# Patient Record
Sex: Female | Born: 1940
Health system: Southern US, Community
[De-identification: ages and names within clinical notes are randomized; demographics above are authoritative.]

## PROBLEM LIST (undated history)

## (undated) DIAGNOSIS — I10 Essential (primary) hypertension: Secondary | ICD-10-CM

## (undated) DIAGNOSIS — F0150 Vascular dementia without behavioral disturbance: Secondary | ICD-10-CM

## (undated) DIAGNOSIS — B009 Herpesviral infection, unspecified: Secondary | ICD-10-CM

## (undated) HISTORY — DX: Herpesviral infection, unspecified: B00.9

## (undated) HISTORY — PX: ABDOMINAL HYSTERECTOMY: SHX81

## (undated) HISTORY — DX: Essential (primary) hypertension: I10

## (undated) HISTORY — DX: Vascular dementia, unspecified severity, without behavioral disturbance, psychotic disturbance, mood disturbance, and anxiety: F01.50

---

## 2011-12-18 DIAGNOSIS — E785 Hyperlipidemia, unspecified: Secondary | ICD-10-CM | POA: Diagnosis not present

## 2011-12-18 DIAGNOSIS — E119 Type 2 diabetes mellitus without complications: Secondary | ICD-10-CM | POA: Diagnosis not present

## 2012-01-29 DIAGNOSIS — H4011X0 Primary open-angle glaucoma, stage unspecified: Secondary | ICD-10-CM | POA: Diagnosis not present

## 2012-01-29 DIAGNOSIS — E11319 Type 2 diabetes mellitus with unspecified diabetic retinopathy without macular edema: Secondary | ICD-10-CM | POA: Diagnosis not present

## 2012-02-13 DIAGNOSIS — H4011X0 Primary open-angle glaucoma, stage unspecified: Secondary | ICD-10-CM | POA: Diagnosis not present

## 2012-02-28 DIAGNOSIS — H3340 Traction detachment of retina, unspecified eye: Secondary | ICD-10-CM | POA: Diagnosis not present

## 2012-02-28 DIAGNOSIS — H35379 Puckering of macula, unspecified eye: Secondary | ICD-10-CM | POA: Diagnosis not present

## 2012-02-28 DIAGNOSIS — E11359 Type 2 diabetes mellitus with proliferative diabetic retinopathy without macular edema: Secondary | ICD-10-CM | POA: Diagnosis not present

## 2012-02-28 DIAGNOSIS — Z961 Presence of intraocular lens: Secondary | ICD-10-CM | POA: Diagnosis not present

## 2012-03-03 DIAGNOSIS — I739 Peripheral vascular disease, unspecified: Secondary | ICD-10-CM | POA: Diagnosis not present

## 2012-03-04 DIAGNOSIS — M949 Disorder of cartilage, unspecified: Secondary | ICD-10-CM | POA: Diagnosis not present

## 2012-03-04 DIAGNOSIS — I10 Essential (primary) hypertension: Secondary | ICD-10-CM | POA: Diagnosis not present

## 2012-03-04 DIAGNOSIS — K219 Gastro-esophageal reflux disease without esophagitis: Secondary | ICD-10-CM | POA: Diagnosis not present

## 2012-03-04 DIAGNOSIS — E119 Type 2 diabetes mellitus without complications: Secondary | ICD-10-CM | POA: Diagnosis not present

## 2012-04-07 DIAGNOSIS — Z113 Encounter for screening for infections with a predominantly sexual mode of transmission: Secondary | ICD-10-CM | POA: Diagnosis not present

## 2012-04-07 DIAGNOSIS — Z20828 Contact with and (suspected) exposure to other viral communicable diseases: Secondary | ICD-10-CM | POA: Diagnosis not present

## 2012-04-07 DIAGNOSIS — Z01419 Encounter for gynecological examination (general) (routine) without abnormal findings: Secondary | ICD-10-CM | POA: Diagnosis not present

## 2012-04-07 DIAGNOSIS — Z124 Encounter for screening for malignant neoplasm of cervix: Secondary | ICD-10-CM | POA: Diagnosis not present

## 2012-04-10 ENCOUNTER — Ambulatory Visit (INDEPENDENT_AMBULATORY_CARE_PROVIDER_SITE_OTHER): Payer: Medicare Other | Admitting: Family

## 2012-04-10 ENCOUNTER — Encounter: Payer: Self-pay | Admitting: Family

## 2012-04-10 VITALS — BP 112/80 | HR 78 | Ht 59.0 in | Wt 204.0 lb

## 2012-04-10 DIAGNOSIS — Z79899 Other long term (current) drug therapy: Secondary | ICD-10-CM

## 2012-04-10 DIAGNOSIS — E669 Obesity, unspecified: Secondary | ICD-10-CM

## 2012-04-10 DIAGNOSIS — I10 Essential (primary) hypertension: Secondary | ICD-10-CM

## 2012-04-10 DIAGNOSIS — E7800 Pure hypercholesterolemia, unspecified: Secondary | ICD-10-CM

## 2012-04-10 DIAGNOSIS — E119 Type 2 diabetes mellitus without complications: Secondary | ICD-10-CM

## 2012-04-10 LAB — BASIC METABOLIC PANEL
CO2: 30 mEq/L (ref 19–32)
Calcium: 9.4 mg/dL (ref 8.4–10.5)
Chloride: 106 mEq/L (ref 96–112)
Potassium: 3.8 mEq/L (ref 3.5–5.1)
Sodium: 140 mEq/L (ref 135–145)

## 2012-04-10 LAB — TSH: TSH: 1.28 u[IU]/mL (ref 0.35–5.50)

## 2012-04-10 NOTE — Progress Notes (Signed)
Subjective:    Patient ID: Lynn Swanson, female    DOB: 18-May-1941, 71 y.o.   MRN: [id number]  HPI 71 year old [place] American female, nonsmoker, new patient to the practice is in the be established. She has a history of hypertension, hyperlipidemia, type 2 diabetes, and obesity. She was in an assisted living facility and her daughter has relocated her here. She tries to follow a healthy diet, and exercises on the treadmill 5 minutes a day. Her blood sugars run between 100 to 200s fasting and postprandial. Patient denies any lightheadedness, dizziness, chest pain, palpitations, shortness of breath or edema.   Review of Systems  Constitutional: Negative.   HENT: Negative.   Eyes: Negative.   Respiratory: Negative.   Cardiovascular: Negative.   Gastrointestinal: Negative.   Genitourinary: Negative.  Negative for frequency.  Musculoskeletal: Negative.   Skin: Negative.   Neurological: Negative.   Hematological: Negative.   Psychiatric/Behavioral: Negative.    Past Medical History  Diagnosis Date  . Hypertension   . Glaucoma   . Diabetes mellitus     History   Social History  . Marital Status: Widowed    Spouse Name: N/A    Number of Children: N/A  . Years of Education: N/A   Occupational History  . Not on file.   Social History Main Topics  . Smoking status: Never Smoker   . Smokeless tobacco: Current User    Types: Snuff  . Alcohol Use: No  . Drug Use: No  . Sexually Active: Not on file   Other Topics Concern  . Not on file   Social History Narrative  . No narrative on file    Past Surgical History  Procedure Date  . Abdominal hysterectomy     No family history on file.  No Known Allergies  Current Outpatient Prescriptions on File Prior to Visit  Medication Sig Dispense Refill  . carvedilol (COREG) 12.5 MG tablet Take 12.5 mg by mouth 2 (two) times daily with a meal.       . JANUVIA 100 MG tablet Take 100 mg by mouth daily.       Marland Kitchen KLOR-CON M20 20  MEQ tablet Take 20 mEq by mouth 2 (two) times daily.       Marland Kitchen LANTUS 100 UNIT/ML injection Inject 62 Units into the skin daily.       Marland Kitchen omeprazole (PRILOSEC) 20 MG capsule Take 20 mg by mouth daily.       . pioglitazone (ACTOS) 15 MG tablet Take 15 mg by mouth daily.       . simvastatin (ZOCOR) 20 MG tablet Take 20 mg by mouth daily.       . valsartan-hydrochlorothiazide (DIOVAN-HCT) 160-12.5 MG per tablet Take 1 tablet by mouth daily.         BP 112/80  Pulse 78  Ht 4\' 11"  (1.499 m)  Wt 204 lb (92.534 kg)  BMI 41.20 kg/m2  SpO2 87%chart    Objective:   Physical Exam  Constitutional: She is oriented to person, place, and time. She appears well-developed and well-nourished.  HENT:  Right Ear: External ear normal.  Left Ear: External ear normal.  Nose: Nose normal.  Mouth/Throat: Oropharynx is clear and moist.  Eyes: Conjunctivae are normal. Pupils are equal, round, and reactive to light.  Neck: Normal range of motion. Neck supple.  Cardiovascular: Normal rate, regular rhythm and normal heart sounds.   Pulmonary/Chest: Effort normal and breath sounds normal.  Abdominal: Soft. Bowel sounds are normal.  Musculoskeletal: Normal range of motion.  Neurological: She is alert and oriented to person, place, and time.  Skin: Skin is warm and dry.  Psychiatric: She has a normal mood and affect.          Assessment & Plan:  Assessment: Hypertension, type 2 diabetes, obesity  Plan: Lab sent to include BMP, TSH, hemoglobin A1c will notify patient pending results. Order for future lipids was placed. Encouraged healthy diet and exercise. Continue seeing gynecology. Had mammogram results faxed here. We'll follow the patient in the results of her labs in 3 months and sooner when necessary. DC Norvasc.

## 2012-04-14 ENCOUNTER — Other Ambulatory Visit: Payer: Self-pay | Admitting: Family

## 2012-04-14 DIAGNOSIS — E1165 Type 2 diabetes mellitus with hyperglycemia: Secondary | ICD-10-CM

## 2012-04-14 MED ORDER — INSULIN GLARGINE 100 UNIT/ML ~~LOC~~ SOLN: 62.0000 [IU] | Freq: Every day | SUBCUTANEOUS | Status: DC

## 2012-04-14 MED ORDER — BRIMONIDINE TARTRATE 0.1 % OP SOLN: 1.0000 [drp] | Freq: Two times a day (BID) | OPHTHALMIC | Status: DC

## 2012-04-14 MED ORDER — SYRINGE (DISPOSABLE) 1 ML MISC: 20.0000 [IU] | Freq: Every day | Status: DC

## 2012-04-14 MED ORDER — DORZOLAMIDE HCL-TIMOLOL MAL 2-0.5 % OP SOLN: 1.0000 [drp] | Freq: Two times a day (BID) | OPHTHALMIC | Status: DC

## 2012-04-14 MED ORDER — CARVEDILOL 12.5 MG PO TABS: 12.5000 mg | ORAL_TABLET | Freq: Two times a day (BID) | ORAL | Status: DC

## 2012-04-14 MED ORDER — SIMVASTATIN 20 MG PO TABS: 20.0000 mg | ORAL_TABLET | Freq: Every day | ORAL | Status: DC

## 2012-04-14 MED ORDER — VALSARTAN-HYDROCHLOROTHIAZIDE 160-12.5 MG PO TABS: 1.0000 | ORAL_TABLET | Freq: Every day | ORAL | Status: DC

## 2012-04-14 MED ORDER — "INSULIN SYRINGE-NEEDLE U-100 30G X 1/2"" 1 ML MISC": 20.0000 [IU] | Freq: Every day | Status: DC

## 2012-04-14 MED ORDER — BIMATOPROST 0.01 % OP SOLN: 1.0000 [drp] | Freq: Every day | OPHTHALMIC | Status: DC

## 2012-04-14 MED ORDER — PIOGLITAZONE HCL 15 MG PO TABS: 15.0000 mg | ORAL_TABLET | Freq: Every day | ORAL | Status: DC

## 2012-04-14 MED ORDER — OMEPRAZOLE 20 MG PO CPDR: 20.0000 mg | DELAYED_RELEASE_CAPSULE | Freq: Every day | ORAL | Status: DC

## 2012-04-14 MED ORDER — LOTEPREDNOL ETABONATE 0.5 % OP SUSP: 1.0000 [drp] | Freq: Four times a day (QID) | OPHTHALMIC | Status: DC

## 2012-04-14 MED ORDER — POTASSIUM CHLORIDE CRYS ER 20 MEQ PO TBCR: 20.0000 meq | EXTENDED_RELEASE_TABLET | Freq: Two times a day (BID) | ORAL | Status: DC

## 2012-04-14 MED ORDER — SITAGLIPTIN PHOSPHATE 100 MG PO TABS: 100.0000 mg | ORAL_TABLET | Freq: Every day | ORAL | Status: DC

## 2012-04-15 ENCOUNTER — Telehealth: Payer: Self-pay | Admitting: Family

## 2012-04-15 NOTE — Telephone Encounter (Signed)
Patient's daughter called back to report that her mom's blood sugar is 247 every morning and would like advise.

## 2012-04-15 NOTE — Telephone Encounter (Signed)
Increase Lantus by 2 additional units. May try Miralax OTC with morning meds everyday

## 2012-04-15 NOTE — Telephone Encounter (Signed)
Pt's daughter aware and verbalized understanding, stating that she will get Miralax today

## 2012-04-15 NOTE — Telephone Encounter (Signed)
PLEAS ADVISE ON BOTH

## 2012-04-15 NOTE — Telephone Encounter (Signed)
Patient's daughter called stating that her mom is constipated and she would like to have something called in or a suggestion for something over the counter. Please assist.

## 2012-04-16 ENCOUNTER — Other Ambulatory Visit: Payer: Self-pay

## 2012-04-16 MED ORDER — GLUCOSE BLOOD VI STRP: ORAL_STRIP | Status: DC

## 2012-04-16 MED ORDER — SAFETY LANCETS MISC: Status: DC

## 2012-04-17 ENCOUNTER — Other Ambulatory Visit (INDEPENDENT_AMBULATORY_CARE_PROVIDER_SITE_OTHER): Payer: Medicare Other

## 2012-04-17 DIAGNOSIS — I10 Essential (primary) hypertension: Secondary | ICD-10-CM

## 2012-04-17 DIAGNOSIS — E669 Obesity, unspecified: Secondary | ICD-10-CM

## 2012-04-17 DIAGNOSIS — E119 Type 2 diabetes mellitus without complications: Secondary | ICD-10-CM

## 2012-04-17 DIAGNOSIS — E7800 Pure hypercholesterolemia, unspecified: Secondary | ICD-10-CM | POA: Diagnosis not present

## 2012-04-17 LAB — LIPID PANEL
Cholesterol: 97 mg/dL (ref 0–200)
HDL: 36.5 mg/dL — ABNORMAL LOW (ref >39.00)
VLDL: 14.4 mg/dL (ref 0.0–40.0)

## 2012-04-21 DIAGNOSIS — E1049 Type 1 diabetes mellitus with other diabetic neurological complication: Secondary | ICD-10-CM | POA: Diagnosis not present

## 2012-04-21 DIAGNOSIS — E1165 Type 2 diabetes mellitus with hyperglycemia: Secondary | ICD-10-CM | POA: Diagnosis not present

## 2012-04-21 DIAGNOSIS — M79609 Pain in unspecified limb: Secondary | ICD-10-CM | POA: Diagnosis not present

## 2012-04-30 DIAGNOSIS — E1139 Type 2 diabetes mellitus with other diabetic ophthalmic complication: Secondary | ICD-10-CM | POA: Diagnosis not present

## 2012-04-30 DIAGNOSIS — M949 Disorder of cartilage, unspecified: Secondary | ICD-10-CM | POA: Diagnosis not present

## 2012-04-30 DIAGNOSIS — E11359 Type 2 diabetes mellitus with proliferative diabetic retinopathy without macular edema: Secondary | ICD-10-CM | POA: Diagnosis not present

## 2012-04-30 DIAGNOSIS — H31099 Other chorioretinal scars, unspecified eye: Secondary | ICD-10-CM | POA: Diagnosis not present

## 2012-04-30 DIAGNOSIS — H31019 Macula scars of posterior pole (postinflammatory) (post-traumatic), unspecified eye: Secondary | ICD-10-CM | POA: Diagnosis not present

## 2012-04-30 DIAGNOSIS — E8941 Symptomatic postprocedural ovarian failure: Secondary | ICD-10-CM | POA: Diagnosis not present

## 2012-04-30 DIAGNOSIS — Z1231 Encounter for screening mammogram for malignant neoplasm of breast: Secondary | ICD-10-CM | POA: Diagnosis not present

## 2012-04-30 DIAGNOSIS — M899 Disorder of bone, unspecified: Secondary | ICD-10-CM | POA: Diagnosis not present

## 2012-05-04 ENCOUNTER — Encounter: Payer: Medicare Other | Attending: Family | Admitting: *Deleted

## 2012-05-04 ENCOUNTER — Encounter: Payer: Self-pay | Admitting: *Deleted

## 2012-05-04 DIAGNOSIS — E119 Type 2 diabetes mellitus without complications: Secondary | ICD-10-CM | POA: Insufficient documentation

## 2012-05-04 DIAGNOSIS — Z713 Dietary counseling and surveillance: Secondary | ICD-10-CM | POA: Insufficient documentation

## 2012-05-04 DIAGNOSIS — M722 Plantar fascial fibromatosis: Secondary | ICD-10-CM | POA: Diagnosis not present

## 2012-05-04 NOTE — Progress Notes (Signed)
  Medical Nutrition Therapy:  Appt start time: 0730 end time:  0830.  Assessment:  Primary concerns today: patient here with her daughter whom she lives with and appears supportive. Spoke mostly with daughter who provides her care. Patient was pleasant and answered most questions appropriately. Daughter states patient walks often and uses treadmill for about 5 minutes every day. She eats well, is trying to lose weight and takes her medications as directed. She tests her BG twice daily, before breakfast and after supper   MEDICATIONS: see list   DIETARY INTAKE:  Usual eating pattern includes 3 meals and 2 snacks per day.  Everyday foods include good variety of all food groups.  Avoided foods include high fat and most sweet foods.    24-hr recall:  B ( AM): croissant sandwich OR oatmeal OR cereal with Almond milk  Snk ( AM): fresh fruit  L ( PM): hot meal OR left overs OR sandwich OR salad with [place] Dressing, water  Snk ( PM): fresh fruit D ( PM): hot meal OR salad depending on what she had for lunch Snk ( PM): none Beverages: water, diet soda  Usual physical activity: walking in yard or on treadmill about 5 minutes daily  Estimated energy needs: 1400 calories 158 g carbohydrates 105 g protein 39 g fat  Progress Towards Goal(s):  In progress.   Nutritional Diagnosis:  NB-1.1 Food and nutrition-related knowledge deficit As related to diabetes.  As evidenced by A1c of 8.5%.    Intervention:  Nutrition counseling and diabetes education provided. Discussed basic physiology of diabetes, SMBG and rationale of checking BG at alternate times of day, A1c, Carb Counting and reading food labels, and benefits of increased activity even at 5 minutes but twice daily Plan: Aim for 2-3 Carb Choices ( 30-45 grams) per meal, 0-1 per snack if hungry Read food labels for Total Carbohydrate of foods Continue with current activity level of 5 minutes on tread mill and consider increasing to twice  daily Continue to check BG twice daily as directed by MD  Handouts given during visit include: Living Well with Diabetes Carb Counting and Food Label handouts Meal Plan Card Medication handout  Monitoring/Evaluation:  Dietary intake, exercise, reading food labels, and body weight in 3 month(s).

## 2012-05-04 NOTE — Patient Instructions (Signed)
Plan: Aim for 2-3 Carb Choices ( 30-45 grams) per meal, 0-1 per snack if hungry Read food labels for Total Carbohydrate of foods Continue with current activity level of 5 minutes on tread mill and consider increasing to twice daily Continue to check BG twice daily as directed by MD

## 2012-05-12 ENCOUNTER — Ambulatory Visit (INDEPENDENT_AMBULATORY_CARE_PROVIDER_SITE_OTHER): Payer: Medicare Other | Admitting: Family

## 2012-05-12 ENCOUNTER — Encounter: Payer: Self-pay | Admitting: Family

## 2012-05-12 VITALS — BP 142/82 | Temp 98.3°F | Wt 203.0 lb

## 2012-05-12 DIAGNOSIS — Z742 Need for assistance at home and no other household member able to render care: Secondary | ICD-10-CM | POA: Diagnosis not present

## 2012-05-12 DIAGNOSIS — I10 Essential (primary) hypertension: Secondary | ICD-10-CM

## 2012-05-12 DIAGNOSIS — Z9181 History of falling: Secondary | ICD-10-CM | POA: Diagnosis not present

## 2012-05-12 DIAGNOSIS — E1165 Type 2 diabetes mellitus with hyperglycemia: Secondary | ICD-10-CM

## 2012-05-12 DIAGNOSIS — IMO0001 Reserved for inherently not codable concepts without codable children: Secondary | ICD-10-CM

## 2012-05-12 NOTE — Progress Notes (Signed)
Subjective:    Patient ID: Lynn Swanson, female    DOB: 03/29/1941, 71 y.o.   MRN: [id number]  HPI 71 year old female, nonsmoker, is in requesting home health care. She is here accompanied by her daughter who has concerns about her preparing meals, dressing and grooming, and showering during the day. Her daughter works from 7:30 AM to 4:30 PM. Patient is a fall risk. However, she does not ambulate with any assistive devices.  Patient is currently on 70 units of Lantus for type 2 diabetes. Blood sugars range between 119 and 320. She shot up follow a better diet. Not exercising.   Review of Systems  Constitutional: Negative.   HENT: Negative.   Respiratory: Negative.   Cardiovascular: Negative.  Negative for chest pain, palpitations and leg swelling.  Gastrointestinal: Negative.   Genitourinary: Negative.   Musculoskeletal: Negative.   Skin: Negative.   Hematological: Negative.   Psychiatric/Behavioral: Positive for confusion. The patient is not nervous/anxious.    Past Medical History  Diagnosis Date  . Hypertension   . Glaucoma   . Diabetes mellitus     History   Social History  . Marital Status: Widowed    Spouse Name: N/A    Number of Children: N/A  . Years of Education: N/A   Occupational History  . Not on file.   Social History Main Topics  . Smoking status: Never Smoker   . Smokeless tobacco: Current User    Types: Snuff  . Alcohol Use: No  . Drug Use: No  . Sexually Active: Not on file   Other Topics Concern  . Not on file   Social History Narrative  . No narrative on file    Past Surgical History  Procedure Date  . Abdominal hysterectomy     No family history on file.  No Known Allergies  Current Outpatient Prescriptions on File Prior to Visit  Medication Sig Dispense Refill  . bimatoprost (LUMIGAN) 0.01 % SOLN Place 1 drop into both eyes at bedtime.  1 Bottle  3  . brimonidine (ALPHAGAN P) 0.1 % SOLN Place 1 drop into both eyes 2 (two) times  daily.  1 Bottle  3  . carvedilol (COREG) 12.5 MG tablet Take 1 tablet (12.5 mg total) by mouth 2 (two) times daily with a meal.  60 tablet  3  . dorzolamide-timolol (COSOPT) 22.3-6.8 MG/ML ophthalmic solution Place 1 drop into both eyes 2 (two) times daily.  10 mL  3  . glucose blood test strip Use as instructed  100 each  12  . insulin glargine (LANTUS) 100 UNIT/ML injection Inject 62 Units into the skin daily.  10 mL  3  . Insulin Syringe-Needle U-100 (B-D INS SYR ULTRAFINE 1CC/30G) 30G X 1/2" 1 ML MISC Inject 20 Units into the skin daily.  100 each  3  . loteprednol (LOTEMAX) 0.5 % ophthalmic suspension Place 1 drop into both eyes 4 (four) times daily.  5 mL  3  . omeprazole (PRILOSEC) 20 MG capsule Take 1 capsule (20 mg total) by mouth daily.  30 capsule  3  . pioglitazone (ACTOS) 15 MG tablet Take 1 tablet (15 mg total) by mouth daily.  30 tablet  3  . potassium chloride SA (KLOR-CON M20) 20 MEQ tablet Take 1 tablet (20 mEq total) by mouth 2 (two) times daily.  60 tablet  3  . SAFETY LANCETS MISC Use as directed twice daily  100 each  6  . simvastatin (ZOCOR) 20 MG tablet Take  1 tablet (20 mg total) by mouth daily.  30 tablet  3  . sitaGLIPtin (JANUVIA) 100 MG tablet Take 1 tablet (100 mg total) by mouth daily.  30 tablet  3  . Syringe, Disposable, 1 ML MISC 20 Units by Does not apply route at bedtime.  100 each  3  . valsartan-hydrochlorothiazide (DIOVAN-HCT) 160-12.5 MG per tablet Take 1 tablet by mouth daily.  30 tablet  3    BP 142/82  Temp 98.3 F (36.8 C) (Oral)  Wt 203 lb (92.08 kg)chart    Objective:   Physical Exam  Constitutional: She is oriented to person, place, and time. She appears well-developed and well-nourished.  HENT:  Right Ear: External ear normal.  Left Ear: External ear normal.  Nose: Nose normal.  Mouth/Throat: Oropharynx is clear and moist.  Neck: Normal range of motion. Neck supple.  Cardiovascular: Normal rate, regular rhythm and normal heart sounds.     Pulmonary/Chest: Effort normal and breath sounds normal.  Abdominal: Soft. Bowel sounds are normal.  Musculoskeletal: Normal range of motion.       Walks with the assistance of her daughter  Neurological: She is alert and oriented to person, place, and time.  Skin: Skin is warm and dry.  Psychiatric: She has a normal mood and affect.         Assessment & Plan:  Assessment: Need for home health care, hypertension, hyperlipidemia, type 2 diabetes, fall risk  Plan: Home health care referral placed. Follow up patient as scheduled and sooner when necessary. Consider assistive device.

## 2012-05-26 DIAGNOSIS — E11359 Type 2 diabetes mellitus with proliferative diabetic retinopathy without macular edema: Secondary | ICD-10-CM | POA: Diagnosis not present

## 2012-05-26 DIAGNOSIS — H40159 Residual stage of open-angle glaucoma, unspecified eye: Secondary | ICD-10-CM | POA: Diagnosis not present

## 2012-05-26 DIAGNOSIS — Z961 Presence of intraocular lens: Secondary | ICD-10-CM | POA: Diagnosis not present

## 2012-05-27 DIAGNOSIS — E119 Type 2 diabetes mellitus without complications: Secondary | ICD-10-CM | POA: Diagnosis not present

## 2012-05-27 DIAGNOSIS — E782 Mixed hyperlipidemia: Secondary | ICD-10-CM | POA: Diagnosis not present

## 2012-05-27 DIAGNOSIS — I10 Essential (primary) hypertension: Secondary | ICD-10-CM | POA: Diagnosis not present

## 2012-05-27 DIAGNOSIS — I739 Peripheral vascular disease, unspecified: Secondary | ICD-10-CM | POA: Diagnosis not present

## 2012-05-28 DIAGNOSIS — B351 Tinea unguium: Secondary | ICD-10-CM | POA: Diagnosis not present

## 2012-05-28 DIAGNOSIS — M79609 Pain in unspecified limb: Secondary | ICD-10-CM | POA: Diagnosis not present

## 2012-06-08 ENCOUNTER — Ambulatory Visit: Payer: Medicare Other | Admitting: *Deleted

## 2012-06-12 DIAGNOSIS — Z01810 Encounter for preprocedural cardiovascular examination: Secondary | ICD-10-CM | POA: Diagnosis not present

## 2012-06-12 DIAGNOSIS — I739 Peripheral vascular disease, unspecified: Secondary | ICD-10-CM | POA: Diagnosis not present

## 2012-06-17 ENCOUNTER — Other Ambulatory Visit: Payer: Self-pay | Admitting: Family

## 2012-06-18 DIAGNOSIS — M949 Disorder of cartilage, unspecified: Secondary | ICD-10-CM | POA: Diagnosis not present

## 2012-07-10 ENCOUNTER — Ambulatory Visit (INDEPENDENT_AMBULATORY_CARE_PROVIDER_SITE_OTHER): Payer: Medicare Other | Admitting: Family

## 2012-07-10 ENCOUNTER — Encounter: Payer: Self-pay | Admitting: Family

## 2012-07-10 VITALS — BP 120/80 | HR 80 | Temp 98.4°F | Wt 193.0 lb

## 2012-07-10 DIAGNOSIS — Z7251 High risk heterosexual behavior: Secondary | ICD-10-CM | POA: Diagnosis not present

## 2012-07-10 DIAGNOSIS — Z202 Contact with and (suspected) exposure to infections with a predominantly sexual mode of transmission: Secondary | ICD-10-CM

## 2012-07-10 DIAGNOSIS — Z2089 Contact with and (suspected) exposure to other communicable diseases: Secondary | ICD-10-CM | POA: Diagnosis not present

## 2012-07-10 DIAGNOSIS — E119 Type 2 diabetes mellitus without complications: Secondary | ICD-10-CM | POA: Diagnosis not present

## 2012-07-10 DIAGNOSIS — Z23 Encounter for immunization: Secondary | ICD-10-CM

## 2012-07-10 DIAGNOSIS — I10 Essential (primary) hypertension: Secondary | ICD-10-CM

## 2012-07-10 DIAGNOSIS — L03039 Cellulitis of unspecified toe: Secondary | ICD-10-CM | POA: Diagnosis not present

## 2012-07-10 DIAGNOSIS — E785 Hyperlipidemia, unspecified: Secondary | ICD-10-CM

## 2012-07-10 DIAGNOSIS — E876 Hypokalemia: Secondary | ICD-10-CM

## 2012-07-10 NOTE — Progress Notes (Signed)
Subjective:    Patient ID: Lynn Swanson, female    DOB: 1941-06-14, 71 y.o.   MRN: [id number]  HPI 71 year old African American female is in for recheck of type 2 diabetes, hyperlipidemia, hypertension. She's currently doing well on all of her medications. Denies any concerns. However, her daughter has concerns of sexually transmitted diseases. No known exposure. However, patient will sexually active before relocating to [place]. Therefore, her daughter has suggested that she be screened for all sexual transmitted diseases. She was sexually active with one female partner.   Review of Systems  Constitutional: Negative.   HENT: Negative.   Respiratory: Negative.   Cardiovascular: Negative.   Gastrointestinal: Negative.   Genitourinary: Negative.   Musculoskeletal: Negative.   Skin: Negative.   Neurological: Negative.   Hematological: Negative.   Psychiatric/Behavioral: Negative.    Past Medical History  Diagnosis Date  . Hypertension   . Glaucoma   . Diabetes mellitus     History   Social History  . Marital Status: Widowed    Spouse Name: N/A    Number of Children: N/A  . Years of Education: N/A   Occupational History  . Not on file.   Social History Main Topics  . Smoking status: Never Smoker   . Smokeless tobacco: Current User    Types: Snuff  . Alcohol Use: No  . Drug Use: No  . Sexually Active: Not on file   Other Topics Concern  . Not on file   Social History Narrative  . No narrative on file    Past Surgical History  Procedure Date  . Abdominal hysterectomy     No family history on file.  No Known Allergies  Current Outpatient Prescriptions on File Prior to Visit  Medication Sig Dispense Refill  . amLODipine (NORVASC) 5 MG tablet TAKE 1 TABLET BY MOUTH DAILY  30 tablet  1  . bimatoprost (LUMIGAN) 0.01 % SOLN Place 1 drop into both eyes at bedtime.  1 Bottle  3  . brimonidine (ALPHAGAN P) 0.1 % SOLN Place 1 drop into both eyes 2 (two) times  daily.  1 Bottle  3  . carvedilol (COREG) 12.5 MG tablet Take 1 tablet (12.5 mg total) by mouth 2 (two) times daily with a meal.  60 tablet  3  . dorzolamide-timolol (COSOPT) 22.3-6.8 MG/ML ophthalmic solution Place 1 drop into both eyes 2 (two) times daily.  10 mL  3  . glucose blood test strip Use as instructed  100 each  12  . insulin glargine (LANTUS) 100 UNIT/ML injection Inject 62 Units into the skin daily.  10 mL  3  . Insulin Syringe-Needle U-100 (B-D INS SYR ULTRAFINE 1CC/30G) 30G X 1/2" 1 ML MISC Inject 20 Units into the skin daily.  100 each  3  . loteprednol (LOTEMAX) 0.5 % ophthalmic suspension Place 1 drop into both eyes 4 (four) times daily.  5 mL  3  . omeprazole (PRILOSEC) 20 MG capsule Take 1 capsule (20 mg total) by mouth daily.  30 capsule  3  . pioglitazone (ACTOS) 15 MG tablet Take 1 tablet (15 mg total) by mouth daily.  30 tablet  3  . potassium chloride SA (KLOR-CON M20) 20 MEQ tablet Take 1 tablet (20 mEq total) by mouth 2 (two) times daily.  60 tablet  3  . SAFETY LANCETS MISC Use as directed twice daily  100 each  6  . simvastatin (ZOCOR) 20 MG tablet Take 1 tablet (20 mg total) by  mouth daily.  30 tablet  3  . sitaGLIPtin (JANUVIA) 100 MG tablet Take 1 tablet (100 mg total) by mouth daily.  30 tablet  3  . Syringe, Disposable, 1 ML MISC 20 Units by Does not apply route at bedtime.  100 each  3  . valsartan-hydrochlorothiazide (DIOVAN-HCT) 160-12.5 MG per tablet Take 1 tablet by mouth daily.  30 tablet  3    BP 120/80  Pulse 80  Temp 98.4 F (36.9 C) (Oral)  Wt 193 lb (87.544 kg)  SpO2 95%chart    Objective:   Physical Exam  Constitutional: She is oriented to person, place, and time. She appears well-developed and well-nourished.  HENT:  Right Ear: External ear normal.  Left Ear: External ear normal.  Nose: Nose normal.  Mouth/Throat: Oropharynx is clear and moist.  Eyes: Conjunctivae are normal. Pupils are equal, round, and reactive to light.  Neck: Normal  range of motion. Neck supple.  Cardiovascular: Normal rate, regular rhythm and normal heart sounds.   Pulmonary/Chest: Effort normal and breath sounds normal.  Abdominal: Soft. Bowel sounds are normal.  Musculoskeletal: Normal range of motion.       Monofilament intact. Feet skin intact.  Neurological: She is alert and oriented to person, place, and time.  Skin: Skin is warm and dry.  Psychiatric: She has a normal mood and affect.     Influenza vaccine administered     Assessment & Plan:  Assessment: Type 2 Diabetes, Hypertension, Hyperlipidemia, High Risk Sexual Behavior  Plan: Patient's daughter wants her tested for all STDs. She was sexually active prior to moving to [facility] and never had screening. Labs sent CBC, Alc, lipids, BMP, HIV, RPR, HSV 1 and 2. Will notify patient pending results. Encouraged healthy diet, exercise. Follow up with patient in the results of her labs come in 3 months and when necessary.

## 2012-07-13 ENCOUNTER — Telehealth: Payer: Self-pay | Admitting: Family

## 2012-07-13 LAB — BASIC METABOLIC PANEL
CO2: 26 mEq/L (ref 19–32)
Calcium: 9.4 mg/dL (ref 8.4–10.5)
Chloride: 105 mEq/L (ref 96–112)
Sodium: 140 mEq/L (ref 135–145)

## 2012-07-13 LAB — HSV(HERPES SIMPLEX VRS) I + II AB-IGG
HSV 1 Glycoprotein G Ab, IgG: 12.98 IV — ABNORMAL HIGH
HSV 2 Glycoprotein G Ab, IgG: 12.9 IV — ABNORMAL HIGH

## 2012-07-13 LAB — LIPID PANEL
Total CHOL/HDL Ratio: 5
Triglycerides: 101 mg/dL (ref 0.0–149.0)

## 2012-07-13 NOTE — Telephone Encounter (Signed)
Lynn Swanson with [facility] need order to re-certify the pt for the [facility] nursing visits. Please assist.

## 2012-07-14 ENCOUNTER — Telehealth: Payer: Self-pay | Admitting: Family

## 2012-07-14 ENCOUNTER — Other Ambulatory Visit: Payer: Self-pay

## 2012-07-14 LAB — GC/CHLAMYDIA PROBE AMP, URINE
Chlamydia, Swab/Urine, PCR: NEGATIVE
GC Probe Amp, Urine: NEGATIVE

## 2012-07-14 MED ORDER — GLUCOSE BLOOD VI STRP: ORAL_STRIP | Status: DC

## 2012-07-14 MED ORDER — VALACYCLOVIR HCL 1 G PO TABS: 500.0000 mg | ORAL_TABLET | Freq: Every day | ORAL | Status: DC | PRN

## 2012-07-14 NOTE — Addendum Note (Signed)
Addended by: Beverely Low on: 07/14/2012 04:31 PM   Modules accepted: Orders

## 2012-07-14 NOTE — Telephone Encounter (Signed)
Okay to order?

## 2012-07-14 NOTE — Telephone Encounter (Signed)
Harriett Sine Dr. Caryl Never is the MD that has to order this per [facility].

## 2012-07-14 NOTE — Telephone Encounter (Signed)
Marylene Land informed on personally identified VM, OK to re-certify

## 2012-07-14 NOTE — Telephone Encounter (Signed)
See below

## 2012-07-14 NOTE — Telephone Encounter (Signed)
The pharmacy called stating that they need a new rx for her glucose blood test strips. They need it to have the diagnosis code and the directions have to be exact and can no longer state use as directed per medicare guidelines. Please assist.

## 2012-07-20 DIAGNOSIS — L03039 Cellulitis of unspecified toe: Secondary | ICD-10-CM | POA: Diagnosis not present

## 2012-07-29 ENCOUNTER — Other Ambulatory Visit: Payer: Self-pay

## 2012-07-29 MED ORDER — SITAGLIPTIN PHOSPHATE 100 MG PO TABS: 100.0000 mg | ORAL_TABLET | Freq: Every day | ORAL | Status: DC

## 2012-07-29 MED ORDER — PIOGLITAZONE HCL 15 MG PO TABS: 15.0000 mg | ORAL_TABLET | Freq: Every day | ORAL | Status: DC

## 2012-07-31 ENCOUNTER — Other Ambulatory Visit: Payer: Self-pay | Admitting: Family

## 2012-07-31 DIAGNOSIS — T8189XA Other complications of procedures, not elsewhere classified, initial encounter: Secondary | ICD-10-CM | POA: Diagnosis not present

## 2012-08-10 ENCOUNTER — Other Ambulatory Visit: Payer: Self-pay | Admitting: Family

## 2012-08-13 DIAGNOSIS — H31019 Macula scars of posterior pole (postinflammatory) (post-traumatic), unspecified eye: Secondary | ICD-10-CM | POA: Diagnosis not present

## 2012-08-13 DIAGNOSIS — H4011X0 Primary open-angle glaucoma, stage unspecified: Secondary | ICD-10-CM | POA: Diagnosis not present

## 2012-08-13 DIAGNOSIS — E11359 Type 2 diabetes mellitus with proliferative diabetic retinopathy without macular edema: Secondary | ICD-10-CM | POA: Diagnosis not present

## 2012-08-13 DIAGNOSIS — H35379 Puckering of macula, unspecified eye: Secondary | ICD-10-CM | POA: Diagnosis not present

## 2012-08-24 ENCOUNTER — Telehealth: Payer: Self-pay | Admitting: Family

## 2012-08-24 ENCOUNTER — Other Ambulatory Visit: Payer: Self-pay

## 2012-08-24 DIAGNOSIS — L608 Other nail disorders: Secondary | ICD-10-CM | POA: Diagnosis not present

## 2012-08-24 DIAGNOSIS — L84 Corns and callosities: Secondary | ICD-10-CM | POA: Diagnosis not present

## 2012-08-24 DIAGNOSIS — I739 Peripheral vascular disease, unspecified: Secondary | ICD-10-CM | POA: Diagnosis not present

## 2012-08-24 DIAGNOSIS — E1059 Type 1 diabetes mellitus with other circulatory complications: Secondary | ICD-10-CM | POA: Diagnosis not present

## 2012-08-24 MED ORDER — INSULIN GLARGINE 100 UNIT/ML ~~LOC~~ SOLN: 62.0000 [IU] | Freq: Every day | SUBCUTANEOUS | Status: DC

## 2012-08-24 MED ORDER — INSULIN GLARGINE 100 UNIT/ML ~~LOC~~ SOLN: 75.0000 [IU] | Freq: Every day | SUBCUTANEOUS | Status: DC

## 2012-08-24 NOTE — Telephone Encounter (Signed)
Pts daughter came by and said that Adline Mango increased pts insulin glargine (LANTUS) 100 UNIT/ML injection, increase by 2 units every 3 days if not down to 120. Pt said that the script was not increase and pt is going to run out of med early. Need new script called in to [facility] [address] in  [place].   Also pt is complete out of Lantus and would like to get some samples.

## 2012-09-03 ENCOUNTER — Other Ambulatory Visit: Payer: Self-pay | Admitting: Family

## 2012-09-08 ENCOUNTER — Telehealth: Payer: Self-pay | Admitting: Family

## 2012-09-08 NOTE — Telephone Encounter (Signed)
Left message to notify home health of Padonda's note

## 2012-09-08 NOTE — Telephone Encounter (Signed)
[facility] called back to check on status of getting verbal order to continue home health visits. Pls call back asap.

## 2012-09-08 NOTE — Telephone Encounter (Signed)
OK to continue. Please let the Home Health know.

## 2012-09-08 NOTE — Telephone Encounter (Signed)
Home health nurse is requesting verbal order to continue home health nursing visits.  She is scheduled to see the patient this morning.

## 2012-09-16 ENCOUNTER — Other Ambulatory Visit: Payer: Self-pay | Admitting: Family

## 2012-10-12 ENCOUNTER — Other Ambulatory Visit: Payer: Self-pay | Admitting: Family

## 2012-11-01 ENCOUNTER — Other Ambulatory Visit: Payer: Self-pay | Admitting: Family

## 2012-11-09 ENCOUNTER — Telehealth: Payer: Self-pay | Admitting: Family

## 2012-11-09 NOTE — Telephone Encounter (Signed)
Home health nurse is requesting order to continue home health visits to assist patient with incontenence supplies, edcuation on diables and hypertension management.

## 2012-11-09 NOTE — Telephone Encounter (Signed)
Ok to continue

## 2012-11-09 NOTE — Telephone Encounter (Signed)
OK to continue? 

## 2012-11-24 DIAGNOSIS — E119 Type 2 diabetes mellitus without complications: Secondary | ICD-10-CM

## 2012-11-24 DIAGNOSIS — I10 Essential (primary) hypertension: Secondary | ICD-10-CM | POA: Diagnosis not present

## 2012-11-24 DIAGNOSIS — R159 Full incontinence of feces: Secondary | ICD-10-CM

## 2012-11-24 DIAGNOSIS — R32 Unspecified urinary incontinence: Secondary | ICD-10-CM | POA: Diagnosis not present

## 2012-12-13 ENCOUNTER — Other Ambulatory Visit: Payer: Self-pay | Admitting: Family

## 2012-12-24 ENCOUNTER — Encounter: Payer: Self-pay | Admitting: Family

## 2012-12-24 ENCOUNTER — Ambulatory Visit (INDEPENDENT_AMBULATORY_CARE_PROVIDER_SITE_OTHER): Payer: Medicare Other | Admitting: Family

## 2012-12-24 VITALS — BP 150/80 | HR 86 | Wt 193.0 lb

## 2012-12-24 DIAGNOSIS — E669 Obesity, unspecified: Secondary | ICD-10-CM

## 2012-12-24 DIAGNOSIS — E7800 Pure hypercholesterolemia, unspecified: Secondary | ICD-10-CM | POA: Insufficient documentation

## 2012-12-24 DIAGNOSIS — E118 Type 2 diabetes mellitus with unspecified complications: Secondary | ICD-10-CM | POA: Insufficient documentation

## 2012-12-24 DIAGNOSIS — I10 Essential (primary) hypertension: Secondary | ICD-10-CM | POA: Diagnosis not present

## 2012-12-24 DIAGNOSIS — E1165 Type 2 diabetes mellitus with hyperglycemia: Secondary | ICD-10-CM

## 2012-12-24 LAB — LIPID PANEL
HDL: 32.8 mg/dL — ABNORMAL LOW (ref >39.00)
Total CHOL/HDL Ratio: 5
Triglycerides: 131 mg/dL (ref 0.0–149.0)
VLDL: 26.2 mg/dL (ref 0.0–40.0)

## 2012-12-24 LAB — BASIC METABOLIC PANEL
Calcium: 9.4 mg/dL (ref 8.4–10.5)
GFR: 93.19 mL/min (ref >60.00)
Potassium: 4.1 mEq/L (ref 3.5–5.1)
Sodium: 137 mEq/L (ref 135–145)

## 2012-12-24 LAB — CBC WITH DIFFERENTIAL/PLATELET
Basophils Absolute: 0 10*3/uL (ref 0.0–0.1)
Eosinophils Relative: 1.4 % (ref 0.0–5.0)
HCT: 34.4 % — ABNORMAL LOW (ref 36.0–46.0)
Lymphs Abs: 2.5 10*3/uL (ref 0.7–4.0)
MCV: 87.7 fl (ref 78.0–100.0)
Monocytes Absolute: 0.7 10*3/uL (ref 0.1–1.0)
Monocytes Relative: 11.5 % (ref 3.0–12.0)
Neutrophils Relative %: 47.6 % (ref 43.0–77.0)
Platelets: 221 10*3/uL (ref 150.0–400.0)
RDW: 14.4 % (ref 11.5–14.6)
WBC: 6.3 10*3/uL (ref 4.5–10.5)

## 2012-12-24 LAB — HEPATIC FUNCTION PANEL
AST: 17 U/L (ref 0–37)
Alkaline Phosphatase: 73 U/L (ref 39–117)
Bilirubin, Direct: 0 mg/dL (ref 0.0–0.3)

## 2012-12-24 NOTE — Patient Instructions (Addendum)
Diabetes and Exercise  Regular exercise is important and can help:   · Control blood glucose (sugar).  · Decrease blood pressure.  ·   · Control blood lipids (cholesterol, triglycerides).  · Improve overall health.  BENEFITS FROM EXERCISE  · Improved fitness.  · Improved flexibility.  · Improved endurance.  · Increased bone density.  · Weight control.  · Increased muscle strength.  · Decreased body fat.  · Improvement of the body's use of insulin, a hormone.  · Increased insulin sensitivity.  · Reduction of insulin needs.  · Reduced stress and tension.  · Helps you feel better.  People with diabetes who add exercise to their lifestyle gain additional benefits, including:  · Weight loss.  · Reduced appetite.  · Improvement of the body's use of blood glucose.  · Decreased risk factors for heart disease:  · Lowering of cholesterol and triglycerides.  · Raising the level of good cholesterol (high-density lipoproteins, HDL).  · Lowering blood sugar.  · Decreased blood pressure.  TYPE 1 DIABETES AND EXERCISE  · Exercise will usually lower your blood glucose.  · If blood glucose is greater than 240 mg/dl, check urine ketones. If ketones are present, do not exercise.  · Location of the insulin injection sites may need to be adjusted with exercise. Avoid injecting insulin into areas of the body that will be exercised. For example, avoid injecting insulin into:  · The arms when playing tennis.  · The legs when jogging. For more information, discuss this with your caregiver.  · Keep a record of:  · Food intake.  · Type and amount of exercise.  · Expected peak times of insulin action.  · Blood glucose levels.  Do this before, during, and after exercise. Review your records with your caregiver. This will help you to develop guidelines for adjusting food intake and insulin amounts.   TYPE 2 DIABETES AND EXERCISE  · Regular physical activity can help control blood glucose.  · Exercise is important because it may:  · Increase the  body's sensitivity to insulin.  · Improve blood glucose control.  · Exercise reduces the risk of heart disease. It decreases serum cholesterol and triglycerides. It also lowers blood pressure.  · Those who take insulin or oral hypoglycemic agents should watch for signs of hypoglycemia. These signs include dizziness, shaking, sweating, chills, and confusion.  · Body water is lost during exercise. It must be replaced. This will help to avoid loss of body fluids (dehydration) or heat stroke.  Be sure to talk to your caregiver before starting an exercise program to make sure it is safe for you. Remember, any activity is better than none.   Document Released: 01/11/2004 Document Revised: 01/13/2012 Document Reviewed: 04/27/2009  [employer]® Patient Information ©2013 [employer].

## 2012-12-25 ENCOUNTER — Encounter: Payer: Self-pay | Admitting: Family

## 2012-12-25 ENCOUNTER — Ambulatory Visit: Payer: Medicare Other | Admitting: Family

## 2012-12-25 NOTE — Progress Notes (Signed)
Subjective:    Patient ID: Lynn Swanson, female    DOB: 28-Jul-1941, 72 y.o.   MRN: [id number]  HPI 72 year old African American female, nonsmoker is in for recheck of type 2 diabetes, hypertension, hyperlipidemia. She reports currently doing well. Tolerating medication well. Her daughter reports that her eating habits have improved. She continues to not exercise. Reports blood sugars fasting in the 190s to low 200s. Denies any increase in urination or thirst.   Review of Systems  Constitutional: Negative.   HENT: Negative.   Respiratory: Negative.   Cardiovascular: Negative.   Gastrointestinal: Negative.   Endocrine: Negative.  Negative for polydipsia and polyphagia.  Genitourinary: Negative.   Musculoskeletal: Negative.   Skin: Negative.   Neurological: Negative.   Hematological: Negative.   Psychiatric/Behavioral: Negative.    Past Medical History  Diagnosis Date  . Hypertension   . Glaucoma   . Diabetes mellitus     History   Social History  . Marital Status: Widowed    Spouse Name: N/A    Number of Children: N/A  . Years of Education: N/A   Occupational History  . Not on file.   Social History Main Topics  . Smoking status: Never Smoker   . Smokeless tobacco: Current User    Types: Snuff  . Alcohol Use: No  . Drug Use: No  . Sexually Active: Not on file   Other Topics Concern  . Not on file   Social History Narrative  . No narrative on file    Past Surgical History  Procedure Laterality Date  . Abdominal hysterectomy      No family history on file.  No Known Allergies  Current Outpatient Prescriptions on File Prior to Visit  Medication Sig Dispense Refill  . ALPHAGAN P 0.1 % SOLN PLACE 1 DROP INTO BOTH EYES 2 (TWO) TIMES DAILY.  10 mL  3  . amLODipine (NORVASC) 5 MG tablet TAKE 1 TABLET BY MOUTH DAILY  30 tablet  0  . B-D INS SYR ULTRAFINE 1CC/30G 30G X 1/2" 1 ML MISC INJECT 20 UNITS INTO THE SKIN DAILY.  100 each  3  . carvedilol (COREG) 12.5  MG tablet TAKE 1 TABLET (12.5 MG TOTAL) BY MOUTH 2 (TWO) TIMES DAILY WITH A MEAL.  60 tablet  0  . cholecalciferol (VITAMIN D) 1000 UNITS tablet Take 1,000 Units by mouth daily.      . dorzolamide-timolol (COSOPT) 22.3-6.8 MG/ML ophthalmic solution Place 1 drop into both eyes 2 (two) times daily.  10 mL  3  . JANUVIA 100 MG tablet TAKE 1 TABLET (100 MG TOTAL) BY MOUTH DAILY.  30 tablet  0  . LANTUS 100 UNIT/ML injection INJECT 75 UNITS INTO THE SKIN AT BEDTIME.  10 mL  0  . loteprednol (LOTEMAX) 0.5 % ophthalmic suspension Place 1 drop into both eyes 4 (four) times daily.  5 mL  3  . LUMIGAN 0.01 % SOLN PLACE 1 DROP INTO BOTH EYES AT BEDTIME.  2.5 mL  3  . omeprazole (PRILOSEC) 20 MG capsule TAKE 1 CAPSULE (20 MG TOTAL) BY MOUTH DAILY.  30 capsule  0  . pioglitazone (ACTOS) 15 MG tablet Take 1 tablet (15 mg total) by mouth daily.  30 tablet  3  . pioglitazone (ACTOS) 15 MG tablet TAKE 1 TABLET (15 MG TOTAL) BY MOUTH DAILY.  30 tablet  3  . potassium chloride SA (KLOR-CON M20) 20 MEQ tablet Take 1 tablet (20 mEq total) by mouth 2 (two) times daily.  60 tablet  3  . SAFETY LANCETS MISC Use as directed twice daily  100 each  6  . simvastatin (ZOCOR) 20 MG tablet Take 1 tablet (20 mg total) by mouth daily.  30 tablet  3  . sitaGLIPtin (JANUVIA) 100 MG tablet Take 1 tablet (100 mg total) by mouth daily.  30 tablet  3  . Syringe, Disposable, 1 ML MISC 20 Units by Does not apply route at bedtime.  100 each  3  . TRUETRACK TEST test strip USE TWICE DAILY TO CHECK BLOOD SUGAR LEVEL  100 each  1  . valACYclovir (VALTREX) 1000 MG tablet TAKE 1/2 TABLET BY MOUTH DAILY AS NEEDED  15 tablet  0  . valsartan-hydrochlorothiazide (DIOVAN-HCT) 160-12.5 MG per tablet TAKE 1 TABLET BY MOUTH DAILY.  30 tablet  0   No current facility-administered medications on file prior to visit.    BP 150/80  Pulse 86  Wt 193 lb (87.544 kg)  BMI 38.96 kg/m2  SpO2 96%chart    Objective:   Physical Exam  Constitutional: She  is oriented to person, place, and time. She appears well-developed and well-nourished.  HENT:  Right Ear: External ear normal.  Left Ear: External ear normal.  Nose: Nose normal.  Mouth/Throat: Oropharynx is clear and moist.  Neck: Normal range of motion. Neck supple. No thyromegaly present.  Cardiovascular: Normal rate, regular rhythm and normal heart sounds.   Pulmonary/Chest: Effort normal and breath sounds normal.  Abdominal: Soft. Bowel sounds are normal.  Musculoskeletal: Normal range of motion.  Neurological: She is alert and oriented to person, place, and time.  Skin: Skin is warm and dry.  Psychiatric: She has a normal mood and affect.           Assessment & Plan:  Assessment:  1. Type 2 diabetes-uncontrolled 2. Hypertension 3. Hypercholesterolemia  Plan: Lab sent to include BMP, CBC, lipids, LFTs, A1c will notify patient pending results. Increase Lantus by 2 units today. Then increase by 2 units every 3 days for fasting blood sugar greater than 120.recheck in 3 months and sooner as needed.

## 2013-01-06 ENCOUNTER — Telehealth: Payer: Self-pay | Admitting: *Deleted

## 2013-01-06 NOTE — Telephone Encounter (Signed)
[facility] from [facility] wanting to know if you would like for them to continue with home care. This is Pt last visit with them.Please advise

## 2013-01-06 NOTE — Telephone Encounter (Signed)
Please call daughter and find out patient's needs. If we need to continue home health.

## 2013-01-06 NOTE — Telephone Encounter (Signed)
Left message with pt's daughter to see if she would like to continue home health care. Advised pt to call back to let me know

## 2013-01-07 DIAGNOSIS — I739 Peripheral vascular disease, unspecified: Secondary | ICD-10-CM | POA: Diagnosis not present

## 2013-01-07 DIAGNOSIS — E1059 Type 1 diabetes mellitus with other circulatory complications: Secondary | ICD-10-CM | POA: Diagnosis not present

## 2013-01-07 DIAGNOSIS — L608 Other nail disorders: Secondary | ICD-10-CM | POA: Diagnosis not present

## 2013-02-09 ENCOUNTER — Other Ambulatory Visit: Payer: Self-pay | Admitting: Family

## 2013-02-09 ENCOUNTER — Other Ambulatory Visit: Payer: Self-pay

## 2013-02-09 MED ORDER — GLUCOSE BLOOD VI STRP: ORAL_STRIP | Status: DC

## 2013-02-15 ENCOUNTER — Telehealth: Payer: Self-pay | Admitting: Family

## 2013-02-15 ENCOUNTER — Other Ambulatory Visit: Payer: Self-pay

## 2013-02-15 MED ORDER — LOTEPREDNOL ETABONATE 0.5 % OP SUSP: 1.0000 [drp] | Freq: Four times a day (QID) | OPHTHALMIC | Status: DC

## 2013-02-15 MED ORDER — BRIMONIDINE TARTRATE 0.1 % OP SOLN: OPHTHALMIC | Status: DC

## 2013-02-15 MED ORDER — DORZOLAMIDE HCL-TIMOLOL MAL 2-0.5 % OP SOLN: 1.0000 [drp] | Freq: Two times a day (BID) | OPHTHALMIC | Status: DC

## 2013-02-15 NOTE — Telephone Encounter (Signed)
Done

## 2013-02-15 NOTE — Telephone Encounter (Signed)
Please resend patient's rxs as the pharmacy states they never received them.

## 2013-02-17 ENCOUNTER — Other Ambulatory Visit: Payer: Self-pay

## 2013-02-17 MED ORDER — CARVEDILOL 12.5 MG PO TABS: ORAL_TABLET | ORAL | Status: DC

## 2013-02-17 MED ORDER — OMEPRAZOLE 20 MG PO CPDR: DELAYED_RELEASE_CAPSULE | ORAL | Status: DC

## 2013-02-17 MED ORDER — AMLODIPINE BESYLATE 5 MG PO TABS: ORAL_TABLET | ORAL | Status: DC

## 2013-02-18 ENCOUNTER — Other Ambulatory Visit: Payer: Self-pay

## 2013-02-18 MED ORDER — VALSARTAN-HYDROCHLOROTHIAZIDE 160-12.5 MG PO TABS: ORAL_TABLET | ORAL | Status: DC

## 2013-02-18 MED ORDER — POTASSIUM CHLORIDE CRYS ER 20 MEQ PO TBCR: 20.0000 meq | EXTENDED_RELEASE_TABLET | Freq: Two times a day (BID) | ORAL | Status: DC

## 2013-02-18 MED ORDER — SITAGLIPTIN PHOSPHATE 100 MG PO TABS: ORAL_TABLET | ORAL | Status: DC

## 2013-02-18 MED ORDER — PIOGLITAZONE HCL 15 MG PO TABS: 15.0000 mg | ORAL_TABLET | Freq: Every day | ORAL | Status: DC

## 2013-02-18 MED ORDER — SIMVASTATIN 20 MG PO TABS: 20.0000 mg | ORAL_TABLET | Freq: Every day | ORAL | Status: DC

## 2013-02-18 NOTE — Telephone Encounter (Signed)
Spoke with Gunnar Fusi, pharmacist at [facility]. According to [place], Pt's daughter, Teryl Lucy became irrate with her when pt's rxs were not filled. Pharmacist apologized to [facility] when she realized that the refill requests were being sent to Padonda's previous employer in [place]. Gunnar Fusi states that Teryl Lucy is going to call [employer] office to complain. I have sent all refills that have not been filled since pt's last OV to the pharmacy with 90 day supply.   Left a message for pt's daughter to call back

## 2013-03-08 ENCOUNTER — Telehealth: Payer: Self-pay | Admitting: Family

## 2013-03-08 NOTE — Telephone Encounter (Signed)
Please advise 

## 2013-03-08 NOTE — Telephone Encounter (Signed)
[facility] needs to know if they should recertify, or discharge pt. Their plan is to DC, because there's nothing else they can teach her, they have gone as far as they can with her. Unless you have anything.  It's up to family for care from here. Pls advise.

## 2013-03-08 NOTE — Telephone Encounter (Signed)
Pt daughter states that she wants to find another agency to receive the home health care from because one of the people that was sent out quit and never let the agency know that she quit

## 2013-03-08 NOTE — Telephone Encounter (Signed)
Please ask patient's daughter.Lynn KitchenMarland Swanson

## 2013-03-19 ENCOUNTER — Encounter: Payer: Self-pay | Admitting: Family

## 2013-03-19 ENCOUNTER — Ambulatory Visit (INDEPENDENT_AMBULATORY_CARE_PROVIDER_SITE_OTHER): Payer: Medicare Other | Admitting: Family

## 2013-03-19 VITALS — BP 132/80 | HR 82 | Wt 192.0 lb

## 2013-03-19 DIAGNOSIS — I10 Essential (primary) hypertension: Secondary | ICD-10-CM | POA: Diagnosis not present

## 2013-03-19 DIAGNOSIS — K219 Gastro-esophageal reflux disease without esophagitis: Secondary | ICD-10-CM

## 2013-03-19 DIAGNOSIS — IMO0001 Reserved for inherently not codable concepts without codable children: Secondary | ICD-10-CM

## 2013-03-19 DIAGNOSIS — E785 Hyperlipidemia, unspecified: Secondary | ICD-10-CM

## 2013-03-19 LAB — HEPATIC FUNCTION PANEL
ALT: 18 U/L (ref 0–35)
AST: 15 U/L (ref 0–37)
Albumin: 3.2 g/dL — ABNORMAL LOW (ref 3.5–5.2)
Alkaline Phosphatase: 82 U/L (ref 39–117)
Bilirubin, Direct: 0 mg/dL (ref 0.0–0.3)
Total Bilirubin: 0.1 mg/dL — ABNORMAL LOW (ref 0.3–1.2)
Total Protein: 7 g/dL (ref 6.0–8.3)

## 2013-03-19 LAB — LIPID PANEL
Cholesterol: 159 mg/dL (ref 0–200)
HDL: 30.8 mg/dL — ABNORMAL LOW
LDL Cholesterol: 104 mg/dL — ABNORMAL HIGH (ref 0–99)
Total CHOL/HDL Ratio: 5
Triglycerides: 121 mg/dL (ref 0.0–149.0)
VLDL: 24.2 mg/dL (ref 0.0–40.0)

## 2013-03-19 LAB — BASIC METABOLIC PANEL
CO2: 28 mEq/L (ref 19–32)
Chloride: 103 mEq/L (ref 96–112)
Glucose, Bld: 379 mg/dL — ABNORMAL HIGH (ref 70–99)
Potassium: 3.8 mEq/L (ref 3.5–5.1)
Sodium: 136 mEq/L (ref 135–145)

## 2013-03-19 LAB — HEMOGLOBIN A1C: Hgb A1c MFr Bld: 8.5 % — ABNORMAL HIGH (ref 4.6–6.5)

## 2013-03-19 NOTE — Progress Notes (Signed)
Subjective:    Patient ID: Lynn Swanson, female    DOB: Aug 31, 1941, 72 y.o.   MRN: [id number]  HPI 72 year old [place] American female, nonsmoker is in for her 73 month recheck. She has a history of type 2 diabetes, hypertension, hyperlipidemia, and obesity. Reports her fasting blood sugar readings continuing to be in the 170s to 200 range. She is walking to the mailbox and back several times a day but does not routinely exercise. Denies any concerns. Tolerating medications well. Daughter did not adjust insulin as directed at last office visit.    Review of Systems  Constitutional: Negative.   HENT: Negative.   Eyes: Negative.   Respiratory: Negative.   Cardiovascular: Negative.   Gastrointestinal: Negative.   Endocrine: Negative.  Negative for polydipsia and polyphagia.  Genitourinary: Negative.   Musculoskeletal: Negative.   Skin: Negative.   Neurological: Negative.   Psychiatric/Behavioral: Negative.    Past Medical History  Diagnosis Date  . Hypertension   . Glaucoma   . Diabetes mellitus     History   Social History  . Marital Status: Widowed    Spouse Name: N/A    Number of Children: N/A  . Years of Education: N/A   Occupational History  . Not on file.   Social History Main Topics  . Smoking status: Never Smoker   . Smokeless tobacco: Current User    Types: Snuff  . Alcohol Use: No  . Drug Use: No  . Sexually Active: Not on file   Other Topics Concern  . Not on file   Social History Narrative  . No narrative on file    Past Surgical History  Procedure Laterality Date  . Abdominal hysterectomy      No family history on file.  No Known Allergies  Current Outpatient Prescriptions on File Prior to Visit  Medication Sig Dispense Refill  . amLODipine (NORVASC) 5 MG tablet TAKE 1 TABLET BY MOUTH DAILY  90 tablet  1  . B-D INS SYR ULTRAFINE 1CC/30G 30G X 1/2" 1 ML MISC INJECT 20 UNITS INTO THE SKIN DAILY.  100 each  3  . brimonidine (ALPHAGAN P) 0.1 %  SOLN PLACE 1 DROP INTO BOTH EYES 2 (TWO) TIMES DAILY.  10 mL  3  . carvedilol (COREG) 12.5 MG tablet TAKE 1 TABLET (12.5 MG TOTAL) BY MOUTH 2 (TWO) TIMES DAILY WITH A MEAL.  180 tablet  1  . cholecalciferol (VITAMIN D) 1000 UNITS tablet Take 1,000 Units by mouth daily.      . dorzolamide-timolol (COSOPT) 22.3-6.8 MG/ML ophthalmic solution Place 1 drop into both eyes 2 (two) times daily.  10 mL  3  . glucose blood (TRUETRACK TEST) test strip USE TWICE DAILY TO CHECK BLOOD SUGAR LEVEL  100 each  1  . LANTUS 100 UNIT/ML injection INJECT 75 UNITS INTO THE SKIN AT BEDTIME.  10 mL  0  . loteprednol (LOTEMAX) 0.5 % ophthalmic suspension Place 1 drop into both eyes 4 (four) times daily.  5 mL  3  . omeprazole (PRILOSEC) 20 MG capsule TAKE 1 CAPSULE (20 MG TOTAL) BY MOUTH DAILY.  90 capsule  1  . pioglitazone (ACTOS) 15 MG tablet Take 1 tablet (15 mg total) by mouth daily.  90 tablet  1  . potassium chloride SA (KLOR-CON M20) 20 MEQ tablet Take 1 tablet (20 mEq total) by mouth 2 (two) times daily.  180 tablet  1  . SAFETY LANCETS MISC Use as directed twice daily  100 each  6  . simvastatin (ZOCOR) 20 MG tablet Take 1 tablet (20 mg total) by mouth daily.  90 tablet  1  . sitaGLIPtin (JANUVIA) 100 MG tablet Take 1 tablet (100 mg total) by mouth daily.  30 tablet  3  . sitaGLIPtin (JANUVIA) 100 MG tablet TAKE 1 TABLET (100 MG TOTAL) BY MOUTH DAILY.  90 tablet  0  . Syringe, Disposable, 1 ML MISC 20 Units by Does not apply route at bedtime.  100 each  3  . valACYclovir (VALTREX) 1000 MG tablet TAKE 1/2 TABLET BY MOUTH DAILY AS NEEDED  15 tablet  0  . valsartan-hydrochlorothiazide (DIOVAN-HCT) 160-12.5 MG per tablet TAKE 1 TABLET BY MOUTH DAILY.  90 tablet  0   No current facility-administered medications on file prior to visit.    BP 132/80  Pulse 82  Wt 192 lb (87.091 kg)  BMI 38.76 kg/m2  SpO2 97%chart    Objective:   Physical Exam  Constitutional: She appears well-developed and well-nourished.   HENT:  Right Ear: External ear normal.  Left Ear: External ear normal.  Nose: Nose normal.  Mouth/Throat: Oropharynx is clear and moist.  Neck: Normal range of motion. Neck supple.  Cardiovascular: Normal rate, regular rhythm and normal heart sounds.   Pulmonary/Chest: Effort normal and breath sounds normal.  Musculoskeletal: Normal range of motion.  Neurological: She is alert.  Skin: Skin is warm and dry.  Psychiatric: She has a normal mood and affect.          Assessment & Plan:  Assessment:  1. Type 2 Diabetes-uncontrolled 2. Hypertension 3. Hyperlipidemia 4. Obesity  Plan: Continue current medications. I will adjust her Lantus and the results of her A1c today. Consider referral to endocrinology. Encouraged healthy diet and exercise. Self breast exams.Recheck in 3-6 months and sooner as needed.

## 2013-04-01 DIAGNOSIS — L608 Other nail disorders: Secondary | ICD-10-CM | POA: Diagnosis not present

## 2013-04-01 DIAGNOSIS — I739 Peripheral vascular disease, unspecified: Secondary | ICD-10-CM | POA: Diagnosis not present

## 2013-04-01 DIAGNOSIS — E1059 Type 1 diabetes mellitus with other circulatory complications: Secondary | ICD-10-CM | POA: Diagnosis not present

## 2013-04-06 ENCOUNTER — Telehealth: Payer: Self-pay | Admitting: Family

## 2013-04-06 NOTE — Telephone Encounter (Signed)
Rhoderick Moody would like a call regarding a referral for her mother Barbie Croston.

## 2013-04-06 NOTE — Telephone Encounter (Signed)
Daughter will call back with Home Health information

## 2013-04-09 DIAGNOSIS — Z0279 Encounter for issue of other medical certificate: Secondary | ICD-10-CM

## 2013-04-27 ENCOUNTER — Other Ambulatory Visit: Payer: Self-pay

## 2013-04-27 MED ORDER — INSULIN GLARGINE 100 UNIT/ML ~~LOC~~ SOLN: SUBCUTANEOUS | Status: DC

## 2013-04-29 DIAGNOSIS — Z0279 Encounter for issue of other medical certificate: Secondary | ICD-10-CM

## 2013-05-03 ENCOUNTER — Telehealth: Payer: Self-pay | Admitting: Family

## 2013-05-03 DIAGNOSIS — Z0279 Encounter for issue of other medical certificate: Secondary | ICD-10-CM

## 2013-05-03 NOTE — Telephone Encounter (Signed)
Nurse from [employer] called and request to speak to the patient nurse, regarding her moving into [employer]. Please assist.

## 2013-05-03 NOTE — Telephone Encounter (Signed)
Spoke with Melissa concerning forms to be completed by PCP. Meilssa advised that psych questions are a Medicaid requirement and will not need to be answered anymore unless pt is level 2. She is requesting that forms be completed and faxed back to her as soon as possible because daughter Teryl Lucy wants to get pt moved in soon

## 2013-05-04 ENCOUNTER — Other Ambulatory Visit: Payer: Self-pay | Admitting: Family

## 2013-05-05 ENCOUNTER — Telehealth: Payer: Self-pay | Admitting: Family

## 2013-05-05 NOTE — Telephone Encounter (Signed)
Spoke with Rhoderick Moody, pt's daughter and she states that she will pick forms up today.  Called Melissa to leave message letting her know that there is no fax number on forms

## 2013-05-05 NOTE — Telephone Encounter (Signed)
Please reference telephone note from 6/30. [facility] states they still have not received paperwork and need it ASAP. Please call Melissa today.

## 2013-06-09 ENCOUNTER — Other Ambulatory Visit: Payer: Self-pay

## 2013-06-29 ENCOUNTER — Other Ambulatory Visit: Payer: Self-pay | Admitting: Family

## 2013-07-07 DIAGNOSIS — Z124 Encounter for screening for malignant neoplasm of cervix: Secondary | ICD-10-CM | POA: Diagnosis not present

## 2013-07-07 DIAGNOSIS — Z1231 Encounter for screening mammogram for malignant neoplasm of breast: Secondary | ICD-10-CM | POA: Diagnosis not present

## 2013-07-26 ENCOUNTER — Other Ambulatory Visit: Payer: Self-pay | Admitting: Family

## 2013-08-04 ENCOUNTER — Telehealth: Payer: Self-pay | Admitting: Family

## 2013-08-04 NOTE — Telephone Encounter (Signed)
Lynn Swanson needs a secondary dx code to go w/ the incontinence supplies. pls call and ok to leave a message

## 2013-08-05 NOTE — Telephone Encounter (Signed)
Forms to be refaxed from [facility]

## 2013-08-23 DIAGNOSIS — L84 Corns and callosities: Secondary | ICD-10-CM | POA: Diagnosis not present

## 2013-08-23 DIAGNOSIS — L608 Other nail disorders: Secondary | ICD-10-CM | POA: Diagnosis not present

## 2013-08-23 DIAGNOSIS — I739 Peripheral vascular disease, unspecified: Secondary | ICD-10-CM | POA: Diagnosis not present

## 2013-08-23 DIAGNOSIS — E1059 Type 1 diabetes mellitus with other circulatory complications: Secondary | ICD-10-CM | POA: Diagnosis not present

## 2013-08-25 ENCOUNTER — Other Ambulatory Visit: Payer: Self-pay | Admitting: Family

## 2013-09-09 ENCOUNTER — Other Ambulatory Visit: Payer: Self-pay

## 2013-09-10 ENCOUNTER — Other Ambulatory Visit: Payer: Self-pay | Admitting: Family

## 2013-09-17 ENCOUNTER — Ambulatory Visit: Payer: Medicare Other | Admitting: Family

## 2013-09-27 ENCOUNTER — Encounter: Payer: Self-pay | Admitting: Family

## 2013-09-27 ENCOUNTER — Ambulatory Visit (INDEPENDENT_AMBULATORY_CARE_PROVIDER_SITE_OTHER): Payer: Medicare Other | Admitting: Family

## 2013-09-27 VITALS — BP 120/78 | HR 72 | Wt 178.0 lb

## 2013-09-27 DIAGNOSIS — E119 Type 2 diabetes mellitus without complications: Secondary | ICD-10-CM | POA: Diagnosis not present

## 2013-09-27 DIAGNOSIS — Z794 Long term (current) use of insulin: Secondary | ICD-10-CM | POA: Diagnosis not present

## 2013-09-27 DIAGNOSIS — I10 Essential (primary) hypertension: Secondary | ICD-10-CM

## 2013-09-27 DIAGNOSIS — E7800 Pure hypercholesterolemia, unspecified: Secondary | ICD-10-CM | POA: Diagnosis not present

## 2013-09-27 DIAGNOSIS — Z23 Encounter for immunization: Secondary | ICD-10-CM | POA: Diagnosis not present

## 2013-09-27 LAB — BASIC METABOLIC PANEL
BUN: 12 mg/dL (ref 6–23)
CO2: 29 mEq/L (ref 19–32)
Calcium: 9.2 mg/dL (ref 8.4–10.5)
Chloride: 103 mEq/L (ref 96–112)
Creatinine, Ser: 0.8 mg/dL (ref 0.4–1.2)
Glucose, Bld: 181 mg/dL — ABNORMAL HIGH (ref 70–99)

## 2013-09-27 LAB — HEPATIC FUNCTION PANEL
ALT: 20 U/L (ref 0–35)
Albumin: 3.4 g/dL — ABNORMAL LOW (ref 3.5–5.2)
Bilirubin, Direct: 0 mg/dL (ref 0.0–0.3)
Total Protein: 7.7 g/dL (ref 6.0–8.3)

## 2013-09-27 LAB — HEMOGLOBIN A1C: Hgb A1c MFr Bld: 10.4 % — ABNORMAL HIGH (ref 4.6–6.5)

## 2013-09-27 LAB — LIPID PANEL
Cholesterol: 173 mg/dL (ref 0–200)
HDL: 35.8 mg/dL — ABNORMAL LOW
LDL Cholesterol: 120 mg/dL — ABNORMAL HIGH (ref 0–99)
Total CHOL/HDL Ratio: 5
Triglycerides: 86 mg/dL (ref 0.0–149.0)
VLDL: 17.2 mg/dL (ref 0.0–40.0)

## 2013-09-27 MED ORDER — BIMATOPROST 0.01 % OP SOLN: OPHTHALMIC | Status: DC

## 2013-09-27 MED ORDER — GLUCOSE BLOOD VI STRP: 1.0000 | ORAL_STRIP | Freq: Two times a day (BID) | Status: DC

## 2013-09-27 MED ORDER — INSULIN GLARGINE 100 UNIT/ML ~~LOC~~ SOLN: SUBCUTANEOUS | Status: DC

## 2013-09-27 NOTE — Progress Notes (Signed)
Subjective:    Patient ID: Lynn Swanson, female    DOB: [birth year], 72 y.o.   MRN: [id number]  HPI 72 year old African American female is in today for a recheck of Type 2 diabetes, hypercholesterolemia, obesity, and hypertension. She is down 28 intentional pounds over the last 1 year. She is doing well.    Review of Systems  Constitutional: Negative.   HENT: Negative.   Respiratory: Negative.   Cardiovascular: Negative.   Gastrointestinal: Negative.   Endocrine: Negative.   Genitourinary: Negative.   Musculoskeletal: Negative.   Skin: Negative.   Allergic/Immunologic: Negative.   Neurological: Negative.   Hematological: Negative.   Psychiatric/Behavioral: Negative.    Past Medical History  Diagnosis Date  . Hypertension   . Glaucoma   . Diabetes mellitus     History   Social History  . Marital Status: Widowed    Spouse Name: N/A    Number of Children: N/A  . Years of Education: N/A   Occupational History  . Not on file.   Social History Main Topics  . Smoking status: Never Smoker   . Smokeless tobacco: Current User    Types: Snuff  . Alcohol Use: No  . Drug Use: No  . Sexual Activity: Not on file   Other Topics Concern  . Not on file   Social History Narrative  . No narrative on file    Past Surgical History  Procedure Laterality Date  . Abdominal hysterectomy      No family history on file.  No Known Allergies  Current Outpatient Prescriptions on File Prior to Visit  Medication Sig Dispense Refill  . ALPHAGAN P 0.1 % SOLN PLACE 1 DROP INTO BOTH EYES 2 (TWO) TIMES DAILY.  10 mL  3  . amLODipine (NORVASC) 5 MG tablet TAKE 1 TABLET EVERY DAY  90 tablet  1  . B-D INS SYR ULTRAFINE 1CC/30G 30G X 1/2" 1 ML MISC INJECT 20 UNITS INTO THE SKIN DAILY.  100 each  3  . brimonidine (ALPHAGAN P) 0.1 % SOLN PLACE 1 DROP INTO BOTH EYES 2 (TWO) TIMES DAILY.  10 mL  3  . carvedilol (COREG) 12.5 MG tablet TAKE 1 TABLET TWICE A DAY WITH A MEAL  180 tablet  1  .  cholecalciferol (VITAMIN D) 1000 UNITS tablet Take 1,000 Units by mouth daily.      . dorzolamide-timolol (COSOPT) 22.3-6.8 MG/ML ophthalmic solution Place 1 drop into both eyes 2 (two) times daily.  10 mL  3  . loteprednol (LOTEMAX) 0.5 % ophthalmic suspension Place 1 drop into both eyes 4 (four) times daily.  5 mL  3  . LUMIGAN 0.01 % SOLN       . omeprazole (PRILOSEC) 20 MG capsule TAKE ONE CAPSULE EVERY DAY  90 capsule  1  . pioglitazone (ACTOS) 15 MG tablet Take 1 tablet (15 mg total) by mouth daily.  90 tablet  1  . potassium chloride SA (KLOR-CON M20) 20 MEQ tablet Take 1 tablet (20 mEq total) by mouth 2 (two) times daily.  180 tablet  1  . SAFETY LANCETS MISC Use as directed twice daily  100 each  6  . simvastatin (ZOCOR) 20 MG tablet Take 1 tablet (20 mg total) by mouth daily.  90 tablet  1  . sitaGLIPtin (JANUVIA) 100 MG tablet Take 1 tablet (100 mg total) by mouth daily.  30 tablet  3  . sitaGLIPtin (JANUVIA) 100 MG tablet TAKE 1 TABLET (100 MG TOTAL)  BY MOUTH DAILY.  90 tablet  0  . Syringe, Disposable, 1 ML MISC 20 Units by Does not apply route at bedtime.  100 each  3  . valACYclovir (VALTREX) 1000 MG tablet TAKE 1/2 TABLET BY MOUTH DAILY AS NEEDED  15 tablet  0  . valsartan-hydrochlorothiazide (DIOVAN-HCT) 160-12.5 MG per tablet TAKE 1 TABLET BY MOUTH DAILY.  90 tablet  0   No current facility-administered medications on file prior to visit.    BP 120/78  Pulse 72  Wt 178 lb (80.74 kg)chart    Objective:   Physical Exam  Constitutional: She is oriented to person, place, and time. She appears well-developed and well-nourished.  HENT:  Right Ear: External ear normal.  Left Ear: External ear normal.  Nose: Nose normal.  Mouth/Throat: Oropharynx is clear and moist.  Neck: Normal range of motion. Neck supple. No thyromegaly present.  Cardiovascular: Normal rate, regular rhythm and normal heart sounds.   Pulmonary/Chest: Effort normal and breath sounds normal.  Abdominal:  Soft. Bowel sounds are normal.  Musculoskeletal: Normal range of motion.  Neurological: She is alert and oriented to person, place, and time.  Skin: Skin is warm and dry.  Psychiatric: She has a normal mood and affect.          Assessment & Plan:  Assessment:  1. Hypertension 2. Type 2 Diabetes 3. Hypercholesterolemia  4. Obesity-improving   Plan: Encouraged healthy diet, exercise, monthly self breast exam. We'll follow while the patient for complete physical exam in 4 months and sooner as needed. Advised nightly feet checks. Annual eye exams.

## 2013-09-27 NOTE — Patient Instructions (Signed)

## 2013-09-28 ENCOUNTER — Telehealth: Payer: Self-pay

## 2013-09-28 MED ORDER — SIMVASTATIN 20 MG PO TABS: 20.0000 mg | ORAL_TABLET | Freq: Every day | ORAL | Status: DC

## 2013-09-28 MED ORDER — POTASSIUM CHLORIDE CRYS ER 20 MEQ PO TBCR: 20.0000 meq | EXTENDED_RELEASE_TABLET | Freq: Two times a day (BID) | ORAL | Status: DC

## 2013-09-28 NOTE — Telephone Encounter (Signed)
Message copied by Beverely Low on Tue Sep 28, 2013  4:06 PM ------      Message from: Adline Mango B      Created: Mon Sep 27, 2013  4:29 PM       Need to resume potassium. Still too low despite dietary efforts. Blood glucose is actually worse. I am referring to endocrinology. Increase Lantus by 4 units in the evening. Taking cholesterol medication everyday?? If not, start because it is worse too. ------

## 2013-09-28 NOTE — Telephone Encounter (Signed)
Pt's daughter aware of lab results and scripts sent to pharmacy

## 2013-09-29 ENCOUNTER — Other Ambulatory Visit: Payer: Self-pay | Admitting: Family

## 2013-10-07 DIAGNOSIS — E11359 Type 2 diabetes mellitus with proliferative diabetic retinopathy without macular edema: Secondary | ICD-10-CM | POA: Diagnosis not present

## 2013-10-07 DIAGNOSIS — H4011X0 Primary open-angle glaucoma, stage unspecified: Secondary | ICD-10-CM | POA: Diagnosis not present

## 2013-10-07 DIAGNOSIS — E1039 Type 1 diabetes mellitus with other diabetic ophthalmic complication: Secondary | ICD-10-CM | POA: Diagnosis not present

## 2013-10-26 DIAGNOSIS — K130 Diseases of lips: Secondary | ICD-10-CM | POA: Diagnosis not present

## 2013-10-26 DIAGNOSIS — D3701 Neoplasm of uncertain behavior of lip: Secondary | ICD-10-CM | POA: Diagnosis not present

## 2013-10-26 DIAGNOSIS — K089 Disorder of teeth and supporting structures, unspecified: Secondary | ICD-10-CM | POA: Diagnosis not present

## 2013-11-05 DIAGNOSIS — K1370 Unspecified lesions of oral mucosa: Secondary | ICD-10-CM | POA: Diagnosis not present

## 2013-11-15 DIAGNOSIS — R269 Unspecified abnormalities of gait and mobility: Secondary | ICD-10-CM | POA: Diagnosis not present

## 2013-11-15 DIAGNOSIS — L608 Other nail disorders: Secondary | ICD-10-CM | POA: Diagnosis not present

## 2013-11-15 DIAGNOSIS — L84 Corns and callosities: Secondary | ICD-10-CM | POA: Diagnosis not present

## 2013-11-15 DIAGNOSIS — E1059 Type 1 diabetes mellitus with other circulatory complications: Secondary | ICD-10-CM | POA: Diagnosis not present

## 2013-11-15 DIAGNOSIS — I739 Peripheral vascular disease, unspecified: Secondary | ICD-10-CM | POA: Diagnosis not present

## 2013-12-28 ENCOUNTER — Telehealth: Payer: Self-pay | Admitting: Family

## 2013-12-28 ENCOUNTER — Other Ambulatory Visit: Payer: Self-pay | Admitting: Family

## 2013-12-28 NOTE — Telephone Encounter (Signed)
Pt request to change these rx to a 90 day refill. Pt will pu tomorrow if possible.  bimatoprost (LUMIGAN) 0.01 % SOLN brimonidine (ALPHAGAN P) 0.1 % SOLN [facility]: [facility]  Pt would also like to PU a 90 day rx for the insulin glargine (LANTUS) 100 UNIT/ML injection  Not due til March, but can you pre order this for a 90 day the next time?

## 2013-12-29 MED ORDER — BRIMONIDINE TARTRATE 0.1 % OP SOLN: OPHTHALMIC | Status: DC

## 2013-12-29 MED ORDER — BIMATOPROST 0.01 % OP SOLN: OPHTHALMIC | Status: DC

## 2013-12-29 MED ORDER — INSULIN GLARGINE 100 UNIT/ML ~~LOC~~ SOLN: SUBCUTANEOUS | Status: DC

## 2013-12-29 NOTE — Telephone Encounter (Signed)
Done

## 2013-12-30 ENCOUNTER — Other Ambulatory Visit: Payer: Self-pay | Admitting: Family

## 2013-12-31 ENCOUNTER — Telehealth: Payer: Self-pay | Admitting: Family

## 2013-12-31 MED ORDER — INSULIN GLARGINE 100 UNIT/ML ~~LOC~~ SOLN: SUBCUTANEOUS | Status: DC

## 2013-12-31 MED ORDER — DORZOLAMIDE HCL-TIMOLOL MAL 2-0.5 % OP SOLN: 1.0000 [drp] | Freq: Two times a day (BID) | OPHTHALMIC | Status: DC

## 2013-12-31 MED ORDER — GLUCOSE BLOOD VI STRP: ORAL_STRIP | Status: DC

## 2013-12-31 NOTE — Telephone Encounter (Signed)
Pt is requesting 90 supply on each medication lantus,true-track test strips and dorzolamide timolol sent to [facility]

## 2013-12-31 NOTE — Telephone Encounter (Signed)
Scripts sent however pt needs to follow up with an eye doctor for further refills of gtts.   Pt has an eye doctor and will begin having gtts filled there

## 2014-01-25 ENCOUNTER — Encounter: Payer: Self-pay | Admitting: Family

## 2014-01-25 ENCOUNTER — Telehealth: Payer: Self-pay

## 2014-01-25 ENCOUNTER — Telehealth: Payer: Self-pay | Admitting: Family

## 2014-01-25 ENCOUNTER — Ambulatory Visit (INDEPENDENT_AMBULATORY_CARE_PROVIDER_SITE_OTHER): Payer: Medicare Other | Admitting: Family

## 2014-01-25 VITALS — BP 108/62 | HR 62 | Ht 59.0 in | Wt 185.0 lb

## 2014-01-25 DIAGNOSIS — E7800 Pure hypercholesterolemia, unspecified: Secondary | ICD-10-CM | POA: Diagnosis not present

## 2014-01-25 DIAGNOSIS — E119 Type 2 diabetes mellitus without complications: Secondary | ICD-10-CM | POA: Diagnosis not present

## 2014-01-25 DIAGNOSIS — Z23 Encounter for immunization: Secondary | ICD-10-CM

## 2014-01-25 DIAGNOSIS — I10 Essential (primary) hypertension: Secondary | ICD-10-CM | POA: Diagnosis not present

## 2014-01-25 DIAGNOSIS — Z0000 Encounter for general adult medical examination without abnormal findings: Secondary | ICD-10-CM

## 2014-01-25 DIAGNOSIS — K219 Gastro-esophageal reflux disease without esophagitis: Secondary | ICD-10-CM

## 2014-01-25 LAB — CBC WITH DIFFERENTIAL/PLATELET
Basophils Absolute: 0 10*3/uL (ref 0.0–0.1)
Basophils Relative: 0.4 % (ref 0.0–3.0)
Eosinophils Absolute: 0.1 10*3/uL (ref 0.0–0.7)
Eosinophils Relative: 1.6 % (ref 0.0–5.0)
HCT: 34.3 % — ABNORMAL LOW (ref 36.0–46.0)
Hemoglobin: 11.3 g/dL — ABNORMAL LOW (ref 12.0–15.0)
Lymphocytes Relative: 35.5 % (ref 12.0–46.0)
Lymphs Abs: 1.8 10*3/uL (ref 0.7–4.0)
MCHC: 32.9 g/dL (ref 30.0–36.0)
MCV: 91 fl (ref 78.0–100.0)
Monocytes Absolute: 0.5 10*3/uL (ref 0.1–1.0)
Monocytes Relative: 10.2 % (ref 3.0–12.0)
NEUTROS PCT: 52.3 % (ref 43.0–77.0)
Neutro Abs: 2.6 10*3/uL (ref 1.4–7.7)
PLATELETS: 223 10*3/uL (ref 150.0–400.0)
RBC: 3.77 Mil/uL — ABNORMAL LOW (ref 3.87–5.11)
RDW: 14.9 % — ABNORMAL HIGH (ref 11.5–14.6)
WBC: 5 10*3/uL (ref 4.5–10.5)

## 2014-01-25 LAB — LIPID PANEL
Cholesterol: 133 mg/dL (ref 0–200)
HDL: 39.5 mg/dL (ref >39.00)
LDL Cholesterol: 77 mg/dL (ref 0–99)
Total CHOL/HDL Ratio: 3
Triglycerides: 84 mg/dL (ref 0.0–149.0)
VLDL: 16.8 mg/dL (ref 0.0–40.0)

## 2014-01-25 LAB — POCT URINALYSIS DIPSTICK
BILIRUBIN UA: NEGATIVE
Glucose, UA: NEGATIVE
KETONES UA: NEGATIVE
Nitrite, UA: NEGATIVE
PH UA: 5.5
Protein, UA: NEGATIVE
SPEC GRAV UA: 1.02
Urobilinogen, UA: 0.2

## 2014-01-25 LAB — BASIC METABOLIC PANEL
BUN: 19 mg/dL (ref 6–23)
CALCIUM: 9.7 mg/dL (ref 8.4–10.5)
CO2: 30 mEq/L (ref 19–32)
CREATININE: 0.7 mg/dL (ref 0.4–1.2)
Chloride: 104 mEq/L (ref 96–112)
GFR: 107.02 mL/min (ref >60.00)
GLUCOSE: 170 mg/dL — AB (ref 70–99)
POTASSIUM: 4.3 meq/L (ref 3.5–5.1)
Sodium: 138 mEq/L (ref 135–145)

## 2014-01-25 LAB — MICROALBUMIN / CREATININE URINE RATIO
Creatinine,U: 111.2 mg/dL
MICROALB/CREAT RATIO: 0.3 mg/g (ref 0.0–30.0)
Microalb, Ur: 0.3 mg/dL (ref 0.0–1.9)

## 2014-01-25 LAB — HEPATIC FUNCTION PANEL
ALT: 14 U/L (ref 0–35)
AST: 15 U/L (ref 0–37)
Albumin: 3.6 g/dL (ref 3.5–5.2)
Alkaline Phosphatase: 67 U/L (ref 39–117)
BILIRUBIN DIRECT: 0 mg/dL (ref 0.0–0.3)
BILIRUBIN TOTAL: 0.3 mg/dL (ref 0.3–1.2)
Total Protein: 7.9 g/dL (ref 6.0–8.3)

## 2014-01-25 LAB — HEMOGLOBIN A1C: HEMOGLOBIN A1C: 7.1 % — AB (ref 4.6–6.5)

## 2014-01-25 MED ORDER — NITROFURANTOIN MONOHYD MACRO 100 MG PO CAPS: 100.0000 mg | ORAL_CAPSULE | Freq: Two times a day (BID) | ORAL | Status: DC

## 2014-01-25 NOTE — Telephone Encounter (Signed)
Relevant patient education assigned to patient using Emmi. ° °

## 2014-01-25 NOTE — Progress Notes (Signed)
Pre visit review using our clinic review tool, if applicable. No additional management support is needed unless otherwise documented below in the visit note. 

## 2014-01-25 NOTE — Progress Notes (Signed)
Subjective:    Patient ID: Lynn Swanson, female    DOB: [birth year], 73 y.o.   MRN: [id number]  HPI  63 year AA female, nonsmoker with a history of type 2 diabetes, hypertension, hyperlipidemia, obesity is in for wellness  examination for this patient . I reviewed all health maintenance protocols including mammography and colonoscopy.   Needed referrals were placed. Age and diagnosis  appropriate screening labs were ordered. Her immunization history was reviewed and appropriate vaccinations were ordered. Her current medications and allergies were reviewed and needed refills of her chronic medications were ordered. The plan for yearly health maintenance was discussed all orders and referrals were made as appropriate. Received eye exam three months ago.    Review of Systems  Constitutional: Negative.   HENT: Negative.   Eyes: Negative.   Respiratory: Negative.   Cardiovascular: Negative.   Gastrointestinal: Negative.   Endocrine: Negative.   Genitourinary: Negative.   Musculoskeletal: Negative.   Skin: Negative.   Allergic/Immunologic: Negative.   Neurological: Negative.   Hematological: Negative.   Psychiatric/Behavioral: Negative.    Past Medical History  Diagnosis Date  . Hypertension   . Glaucoma   . Diabetes mellitus     History   Social History  . Marital Status: Widowed    Spouse Name: N/A    Number of Children: N/A  . Years of Education: N/A   Occupational History  . Not on file.   Social History Main Topics  . Smoking status: Never Smoker   . Smokeless tobacco: Current User    Types: Snuff  . Alcohol Use: No  . Drug Use: No  . Sexual Activity: Not on file   Other Topics Concern  . Not on file   Social History Narrative  . No narrative on file    Past Surgical History  Procedure Laterality Date  . Abdominal hysterectomy      No family history on file.  No Known Allergies  Current Outpatient Prescriptions on File Prior to Visit  Medication Sig  Dispense Refill  . amLODipine (NORVASC) 5 MG tablet TAKE 1 TABLET EVERY DAY  90 tablet  1  . B-D INS SYR ULTRAFINE 1CC/30G 30G X 1/2" 1 ML MISC INJECT 20 UNITS INTO THE SKIN DAILY.  100 each  3  . bimatoprost (LUMIGAN) 0.01 % SOLN PLACE 1 DROP INTO BOTH EYES AT BEDTIME.  7.5 mL  1  . brimonidine (ALPHAGAN P) 0.1 % SOLN PLACE 1 DROP INTO BOTH EYES 2 (TWO) TIMES DAILY.  10 mL  3  . brimonidine (ALPHAGAN P) 0.1 % SOLN PLACE 1 DROP INTO BOTH EYES 2 (TWO) TIMES DAILY.  30 mL  1  . carvedilol (COREG) 12.5 MG tablet TAKE 1 TABLET TWICE A DAY WITH A MEAL  180 tablet  1  . cholecalciferol (VITAMIN D) 1000 UNITS tablet Take 1,000 Units by mouth daily.      . dorzolamide-timolol (COSOPT) 22.3-6.8 MG/ML ophthalmic solution Place 1 drop into both eyes 2 (two) times daily.  30 mL  1  . glucose blood (TRUETRACK TEST) test strip USE 1 TWO TIMES DAILY AS INSTRUCTED DX/250.02  300 each  1  . insulin glargine (LANTUS) 100 UNIT/ML injection INJECT 75 UNITS INTO THE SKIN AT BEDTIME.  30 mL  1  . JANUVIA 100 MG tablet TAKE 1 TABLET BY MOUTH DAILY  90 tablet  0  . loteprednol (LOTEMAX) 0.5 % ophthalmic suspension Place 1 drop into both eyes 4 (four) times daily.  5  mL  3  . LUMIGAN 0.01 % SOLN       . omeprazole (PRILOSEC) 20 MG capsule TAKE ONE CAPSULE EVERY DAY  90 capsule  1  . pioglitazone (ACTOS) 15 MG tablet Take 1 tablet (15 mg total) by mouth daily.  90 tablet  1  . potassium chloride SA (KLOR-CON M20) 20 MEQ tablet Take 1 tablet (20 mEq total) by mouth 2 (two) times daily.  180 tablet  1  . SAFETY LANCETS MISC Use as directed twice daily  100 each  6  . simvastatin (ZOCOR) 20 MG tablet Take 1 tablet (20 mg total) by mouth daily.  90 tablet  1  . sitaGLIPtin (JANUVIA) 100 MG tablet Take 1 tablet (100 mg total) by mouth daily.  30 tablet  3  . Syringe, Disposable, 1 ML MISC 20 Units by Does not apply route at bedtime.  100 each  3  . valACYclovir (VALTREX) 1000 MG tablet TAKE 1/2 TABLET BY MOUTH DAILY AS NEEDED   15 tablet  0  . valsartan-hydrochlorothiazide (DIOVAN-HCT) 160-12.5 MG per tablet TAKE 1 TABLET BY MOUTH DAILY.  90 tablet  0   No current facility-administered medications on file prior to visit.    BP 108/62  Pulse 62  Ht 4\' 11"  (1.499 m)  Wt 185 lb (83.915 kg)  BMI 37.35 kg/m2  SpO2 93%chart    Objective:   Physical Exam  Constitutional: She is oriented to person, place, and time. She appears well-developed and well-nourished.  HENT:  Head: Normocephalic.  Right Ear: External ear normal.  Left Ear: External ear normal.  Nose: Nose normal.  Mouth/Throat: Oropharynx is clear and moist.  Eyes: Conjunctivae and EOM are normal. Pupils are equal, round, and reactive to light.  Neck: Normal range of motion. Neck supple.  Cardiovascular: Normal rate and regular rhythm.   Pulmonary/Chest: Effort normal and breath sounds normal.  Abdominal: Soft. Bowel sounds are normal.  Musculoskeletal: Normal range of motion.  Neurological: She is alert and oriented to person, place, and time. She has normal reflexes.  Skin: Skin is warm and dry.  Psychiatric: She has a normal mood and affect. Her behavior is normal. Judgment and thought content normal.          Assessment & Plan:  Lynn Swanson was seen today for no specified reason.  Diagnoses and associated orders for this visit:  Preventative health care - Basic Metabolic Panel - Lipid Panel - Hemoglobin A1c - Hepatic Function Panel - POCT urinalysis dipstick - Microalbumin/Creatinine Ratio, Urine - CBC with Differential - EKG 12-Lead - Ambulatory referral to Gastroenterology  Type II or unspecified type diabetes mellitus without mention of complication, not stated as uncontrolled - Hemoglobin A1c - Microalbumin/Creatinine Ratio, Urine - CBC with Differential - EKG 12-Lead  Unspecified essential hypertension - Basic Metabolic Panel - Hepatic Function Panel - CBC with Differential - EKG 12-Lead  Pure  hypercholesterolemia - Lipid Panel - CBC with Differential - EKG 12-Lead  GERD (gastroesophageal reflux disease) - EKG 12-Lead  Vaccine for diphtheria-tetanus - Td vaccine preservative free greater than or equal to 7yo IM    Contact office for questions or concerns.

## 2014-01-25 NOTE — Telephone Encounter (Signed)
Pt's daughter is aware of lab results and per [facility], send in macrobid bid x 7 days for pt to cover possible uti

## 2014-01-25 NOTE — Patient Instructions (Signed)

## 2014-01-26 ENCOUNTER — Telehealth: Payer: Self-pay | Admitting: Family

## 2014-01-26 NOTE — Telephone Encounter (Signed)
Relevant patient education assigned to patient using Emmi. ° °

## 2014-02-01 ENCOUNTER — Telehealth: Payer: Self-pay

## 2014-02-01 NOTE — Telephone Encounter (Signed)
Relevant patient education assigned to patient using Emmi. ° °

## 2014-02-03 DIAGNOSIS — I739 Peripheral vascular disease, unspecified: Secondary | ICD-10-CM | POA: Diagnosis not present

## 2014-02-03 DIAGNOSIS — E1059 Type 1 diabetes mellitus with other circulatory complications: Secondary | ICD-10-CM | POA: Diagnosis not present

## 2014-02-03 DIAGNOSIS — L608 Other nail disorders: Secondary | ICD-10-CM | POA: Diagnosis not present

## 2014-02-28 ENCOUNTER — Encounter: Payer: Self-pay | Admitting: Family

## 2014-04-04 ENCOUNTER — Other Ambulatory Visit: Payer: Self-pay | Admitting: Family

## 2014-04-07 DIAGNOSIS — E1139 Type 2 diabetes mellitus with other diabetic ophthalmic complication: Secondary | ICD-10-CM | POA: Diagnosis not present

## 2014-04-07 DIAGNOSIS — H4011X0 Primary open-angle glaucoma, stage unspecified: Secondary | ICD-10-CM | POA: Diagnosis not present

## 2014-04-07 DIAGNOSIS — E11359 Type 2 diabetes mellitus with proliferative diabetic retinopathy without macular edema: Secondary | ICD-10-CM | POA: Diagnosis not present

## 2014-04-18 ENCOUNTER — Telehealth: Payer: Self-pay | Admitting: Family

## 2014-04-18 NOTE — Telephone Encounter (Signed)
Noted  

## 2014-04-18 NOTE — Telephone Encounter (Signed)
Patient's daughter Ann Lions is requesting a call when you have received the FL2 form. She is going to call Social Services today to have them send the form in.

## 2014-04-22 ENCOUNTER — Telehealth: Payer: Self-pay

## 2014-04-22 NOTE — Telephone Encounter (Signed)
Called [facility] to advise that FL2 is ready to pick up

## 2014-05-27 ENCOUNTER — Ambulatory Visit (INDEPENDENT_AMBULATORY_CARE_PROVIDER_SITE_OTHER): Payer: Medicare Other | Admitting: Family

## 2014-05-27 ENCOUNTER — Encounter: Payer: Self-pay | Admitting: Family

## 2014-05-27 VITALS — BP 130/80 | HR 64 | Temp 99.6°F | Wt 188.0 lb

## 2014-05-27 DIAGNOSIS — E7800 Pure hypercholesterolemia, unspecified: Secondary | ICD-10-CM

## 2014-05-27 DIAGNOSIS — IMO0001 Reserved for inherently not codable concepts without codable children: Secondary | ICD-10-CM

## 2014-05-27 DIAGNOSIS — I10 Essential (primary) hypertension: Secondary | ICD-10-CM | POA: Diagnosis not present

## 2014-05-27 DIAGNOSIS — E1165 Type 2 diabetes mellitus with hyperglycemia: Secondary | ICD-10-CM

## 2014-05-27 LAB — LIPID PANEL
CHOLESTEROL: 120 mg/dL (ref 0–200)
HDL: 39.9 mg/dL (ref >39.00)
LDL Cholesterol: 59 mg/dL (ref 0–99)
NonHDL: 80.1
TRIGLYCERIDES: 107 mg/dL (ref 0.0–149.0)
Total CHOL/HDL Ratio: 3
VLDL: 21.4 mg/dL (ref 0.0–40.0)

## 2014-05-27 LAB — CBC WITH DIFFERENTIAL/PLATELET
Basophils Absolute: 0 10*3/uL (ref 0.0–0.1)
Basophils Relative: 0.2 % (ref 0.0–3.0)
Eosinophils Absolute: 0 10*3/uL (ref 0.0–0.7)
Eosinophils Relative: 0.6 % (ref 0.0–5.0)
HCT: 33.7 % — ABNORMAL LOW (ref 36.0–46.0)
Hemoglobin: 11.2 g/dL — ABNORMAL LOW (ref 12.0–15.0)
LYMPHS ABS: 1.1 10*3/uL (ref 0.7–4.0)
Lymphocytes Relative: 31 % (ref 12.0–46.0)
MCHC: 33.3 g/dL (ref 30.0–36.0)
MCV: 88.8 fl (ref 78.0–100.0)
MONO ABS: 0.4 10*3/uL (ref 0.1–1.0)
Monocytes Relative: 11.3 % (ref 3.0–12.0)
Neutro Abs: 2.1 10*3/uL (ref 1.4–7.7)
Neutrophils Relative %: 56.9 % (ref 43.0–77.0)
PLATELETS: 184 10*3/uL (ref 150.0–400.0)
RBC: 3.8 Mil/uL — ABNORMAL LOW (ref 3.87–5.11)
RDW: 14.6 % (ref 11.5–15.5)
WBC: 3.6 10*3/uL — AB (ref 4.0–10.5)

## 2014-05-27 LAB — HEPATIC FUNCTION PANEL
ALT: 17 U/L (ref 0–35)
AST: 14 U/L (ref 0–37)
Albumin: 3.3 g/dL — ABNORMAL LOW (ref 3.5–5.2)
Alkaline Phosphatase: 64 U/L (ref 39–117)
BILIRUBIN TOTAL: 0.3 mg/dL (ref 0.2–1.2)
Bilirubin, Direct: 0 mg/dL (ref 0.0–0.3)
Total Protein: 7.2 g/dL (ref 6.0–8.3)

## 2014-05-27 LAB — BASIC METABOLIC PANEL
BUN: 16 mg/dL (ref 6–23)
CALCIUM: 9.4 mg/dL (ref 8.4–10.5)
CO2: 30 mEq/L (ref 19–32)
Chloride: 102 mEq/L (ref 96–112)
Creatinine, Ser: 0.8 mg/dL (ref 0.4–1.2)
GFR: 94.21 mL/min (ref >60.00)
Glucose, Bld: 240 mg/dL — ABNORMAL HIGH (ref 70–99)
POTASSIUM: 4 meq/L (ref 3.5–5.1)
Sodium: 135 mEq/L (ref 135–145)

## 2014-05-27 LAB — HEMOGLOBIN A1C: Hgb A1c MFr Bld: 10.2 % — ABNORMAL HIGH (ref 4.6–6.5)

## 2014-05-27 NOTE — Progress Notes (Signed)
Subjective:    Patient ID: Lynn Swanson, female    DOB: 04-Feb-1941, 73 y.o.   MRN: [id number]  HPI 73 year old African American female, nonsmoker with a history of type 2 diabetes, hypertension, hyperlipidemia, and obesity again today for recheck. Daughter reports that she has been holding her nighttime insulin if her blood glucoses have been in the 120 range. Postprandial readings are typically in the 130s. Last hemoglobin A1c was 7.1. Daughter reports that she often gets up in the middle of the night and snacks. Has a 3 pound weight gain. Does not routinely exercise.   Review of Systems  Constitutional: Negative.   HENT: Negative.   Respiratory: Negative.   Cardiovascular: Negative.   Gastrointestinal: Negative.   Endocrine: Negative.   Musculoskeletal: Negative.   Skin: Negative.   Neurological: Negative.   Hematological: Negative.   Psychiatric/Behavioral: Negative.    Past Medical History  Diagnosis Date  . Hypertension   . Glaucoma   . Diabetes mellitus     History   Social History  . Marital Status: Widowed    Spouse Name: N/A    Number of Children: N/A  . Years of Education: N/A   Occupational History  . Not on file.   Social History Main Topics  . Smoking status: Never Smoker   . Smokeless tobacco: Current User    Types: Snuff  . Alcohol Use: No  . Drug Use: No  . Sexual Activity: Not on file   Other Topics Concern  . Not on file   Social History Narrative  . No narrative on file    Past Surgical History  Procedure Laterality Date  . Abdominal hysterectomy      No family history on file.  No Known Allergies  Current Outpatient Prescriptions on File Prior to Visit  Medication Sig Dispense Refill  . amLODipine (NORVASC) 5 MG tablet TAKE 1 TABLET EVERY DAY  90 tablet  1  . B-D INS SYR ULTRAFINE 1CC/30G 30G X 1/2" 1 ML MISC INJECT 20 UNITS INTO THE SKIN DAILY.  100 each  3  . bimatoprost (LUMIGAN) 0.01 % SOLN PLACE 1 DROP INTO BOTH EYES AT  BEDTIME.  7.5 mL  1  . brimonidine (ALPHAGAN P) 0.1 % SOLN PLACE 1 DROP INTO BOTH EYES 2 (TWO) TIMES DAILY.  10 mL  3  . brimonidine (ALPHAGAN P) 0.1 % SOLN PLACE 1 DROP INTO BOTH EYES 2 (TWO) TIMES DAILY.  30 mL  1  . carvedilol (COREG) 12.5 MG tablet TAKE 1 TABLET TWICE A DAY WITH A MEAL  180 tablet  1  . cholecalciferol (VITAMIN D) 1000 UNITS tablet Take 1,000 Units by mouth daily.      . dorzolamide-timolol (COSOPT) 22.3-6.8 MG/ML ophthalmic solution Place 1 drop into both eyes 2 (two) times daily.  30 mL  1  . glucose blood (TRUETRACK TEST) test strip USE 1 TWO TIMES DAILY AS INSTRUCTED DX/250.02  300 each  1  . insulin glargine (LANTUS) 100 UNIT/ML injection INJECT 75 UNITS INTO THE SKIN AT BEDTIME.  30 mL  1  . JANUVIA 100 MG tablet TAKE 1 TABLET BY MOUTH DAILY  90 tablet  1  . KLOR-CON M20 20 MEQ tablet TAKE 1 TABLET (20 MEQ TOTAL) BY MOUTH 2 (TWO) TIMES DAILY.  180 tablet  1  . loteprednol (LOTEMAX) 0.5 % ophthalmic suspension Place 1 drop into both eyes 4 (four) times daily.  5 mL  3  . LUMIGAN 0.01 % SOLN       .  nitrofurantoin, macrocrystal-monohydrate, (MACROBID) 100 MG capsule Take 1 capsule (100 mg total) by mouth 2 (two) times daily.  14 capsule  0  . omeprazole (PRILOSEC) 20 MG capsule TAKE ONE CAPSULE EVERY DAY  90 capsule  1  . pioglitazone (ACTOS) 15 MG tablet TAKE 1 TABLET (15 MG TOTAL) BY MOUTH DAILY.  90 tablet  1  . SAFETY LANCETS MISC Use as directed twice daily  100 each  6  . simvastatin (ZOCOR) 20 MG tablet TAKE 1 TABLET (20 MG TOTAL) BY MOUTH DAILY.  90 tablet  1  . sitaGLIPtin (JANUVIA) 100 MG tablet Take 1 tablet (100 mg total) by mouth daily.  30 tablet  3  . Syringe, Disposable, 1 ML MISC 20 Units by Does not apply route at bedtime.  100 each  3  . valACYclovir (VALTREX) 1000 MG tablet TAKE 1/2 TABLET BY MOUTH DAILY AS NEEDED  15 tablet  0  . valsartan-hydrochlorothiazide (DIOVAN-HCT) 160-12.5 MG per tablet TAKE 1 TABLET BY MOUTH DAILY.  90 tablet  1   No  current facility-administered medications on file prior to visit.    BP 130/80  Pulse 64  Temp(Src) 99.6 F (37.6 C) (Oral)  Wt 188 lb (85.276 kg)chart    Objective:   Physical Exam  Constitutional: She is oriented to person, place, and time. She appears well-developed and well-nourished.  HENT:  Right Ear: External ear normal.  Left Ear: External ear normal.  Nose: Nose normal.  Mouth/Throat: Oropharynx is clear and moist.  Neck: Normal range of motion. Neck supple. No thyromegaly present.  Cardiovascular: Normal rate, regular rhythm and normal heart sounds.   Pulmonary/Chest: Effort normal and breath sounds normal.  Abdominal: Soft. Bowel sounds are normal.  Musculoskeletal: Normal range of motion.  Neurological: She is alert and oriented to person, place, and time.  Skin: Skin is warm.  Psychiatric: She has a normal mood and affect.          Assessment & Plan:  Rossetta was seen today for diabetes.  Diagnoses and associated orders for this visit:  Unspecified essential hypertension - Basic Metabolic Panel - Hepatic Function Panel - CBC with Differential  Type II or unspecified type diabetes mellitus without mention of complication, uncontrolled - Hemoglobin A1c - Hepatic Function Panel - Lipid Panel  Pure hypercholesterolemia - Hepatic Function Panel - Lipid Panel - CBC with Differential    called the office with any questions or concerns. Recheck in 4 months and sooner as needed.

## 2014-05-27 NOTE — Progress Notes (Signed)
Pre visit review using our clinic review tool, if applicable. No additional management support is needed unless otherwise documented below in the visit note. 

## 2014-05-27 NOTE — Patient Instructions (Signed)
Exercise to Lose Weight Exercise and a healthy diet may help you lose weight. Your doctor may suggest specific exercises. EXERCISE IDEAS AND TIPS  Choose low-cost things you enjoy doing, such as walking, bicycling, or exercising to workout videos.  Take stairs instead of the elevator.  Walk during your lunch break.  Park your car further away from work or school.  Go to a gym or an exercise class.  Start with 5 to 10 minutes of exercise each day. Build up to 30 minutes of exercise 4 to 6 days a week.  Wear shoes with good support and comfortable clothes.  Stretch before and after working out.  Work out until you breathe harder and your heart beats faster.  Drink extra water when you exercise.  Do not do so much that you hurt yourself, feel dizzy, or get very short of breath. Exercises that burn about 150 calories:  Running 1  miles in 15 minutes.  Playing volleyball for 45 to 60 minutes.  Washing and waxing a car for 45 to 60 minutes.  Playing touch football for 45 minutes.  Walking 1  miles in 35 minutes.  Pushing a stroller 1  miles in 30 minutes.  Playing basketball for 30 minutes.  Raking leaves for 30 minutes.  Bicycling 5 miles in 30 minutes.  Walking 2 miles in 30 minutes.  Dancing for 30 minutes.  Shoveling snow for 15 minutes.  Swimming laps for 20 minutes.  Walking up stairs for 15 minutes.  Bicycling 4 miles in 15 minutes.  Gardening for 30 to 45 minutes.  Jumping rope for 15 minutes.  Washing windows or floors for 45 to 60 minutes. Document Released: 11/23/2010 Document Revised: 01/13/2012 Document Reviewed: 11/23/2010 [employer] Patient Information 2015 [employer]. This information is not intended to replace advice given to you by your health care provider. Make sure you discuss any questions you have with your health care provider.  

## 2014-06-13 DIAGNOSIS — L608 Other nail disorders: Secondary | ICD-10-CM | POA: Diagnosis not present

## 2014-06-13 DIAGNOSIS — G909 Disorder of the autonomic nervous system, unspecified: Secondary | ICD-10-CM | POA: Diagnosis not present

## 2014-06-13 DIAGNOSIS — E1059 Type 1 diabetes mellitus with other circulatory complications: Secondary | ICD-10-CM | POA: Diagnosis not present

## 2014-06-13 DIAGNOSIS — I739 Peripheral vascular disease, unspecified: Secondary | ICD-10-CM | POA: Diagnosis not present

## 2014-07-01 ENCOUNTER — Other Ambulatory Visit: Payer: Self-pay | Admitting: Family

## 2014-07-06 ENCOUNTER — Other Ambulatory Visit: Payer: Self-pay

## 2014-07-06 MED ORDER — GLUCOSE BLOOD VI STRP: ORAL_STRIP | Status: DC

## 2014-08-25 DIAGNOSIS — L603 Nail dystrophy: Secondary | ICD-10-CM | POA: Diagnosis not present

## 2014-08-25 DIAGNOSIS — I739 Peripheral vascular disease, unspecified: Secondary | ICD-10-CM | POA: Diagnosis not present

## 2014-08-25 DIAGNOSIS — E1051 Type 1 diabetes mellitus with diabetic peripheral angiopathy without gangrene: Secondary | ICD-10-CM | POA: Diagnosis not present

## 2014-09-27 ENCOUNTER — Ambulatory Visit: Payer: Medicare Other | Admitting: Family

## 2014-09-27 DIAGNOSIS — Z124 Encounter for screening for malignant neoplasm of cervix: Secondary | ICD-10-CM | POA: Diagnosis not present

## 2014-09-27 DIAGNOSIS — Z01419 Encounter for gynecological examination (general) (routine) without abnormal findings: Secondary | ICD-10-CM | POA: Diagnosis not present

## 2014-09-27 DIAGNOSIS — Z1231 Encounter for screening mammogram for malignant neoplasm of breast: Secondary | ICD-10-CM | POA: Diagnosis not present

## 2014-09-27 DIAGNOSIS — M8588 Other specified disorders of bone density and structure, other site: Secondary | ICD-10-CM | POA: Diagnosis not present

## 2014-10-05 ENCOUNTER — Ambulatory Visit (INDEPENDENT_AMBULATORY_CARE_PROVIDER_SITE_OTHER): Payer: Medicare Other

## 2014-10-05 ENCOUNTER — Encounter: Payer: Self-pay | Admitting: Family

## 2014-10-05 ENCOUNTER — Ambulatory Visit (INDEPENDENT_AMBULATORY_CARE_PROVIDER_SITE_OTHER): Payer: Medicare Other | Admitting: Family

## 2014-10-05 VITALS — BP 126/78 | HR 97 | Ht 59.0 in | Wt 196.6 lb

## 2014-10-05 DIAGNOSIS — E1140 Type 2 diabetes mellitus with diabetic neuropathy, unspecified: Secondary | ICD-10-CM

## 2014-10-05 DIAGNOSIS — Z23 Encounter for immunization: Secondary | ICD-10-CM

## 2014-10-05 DIAGNOSIS — Z1211 Encounter for screening for malignant neoplasm of colon: Secondary | ICD-10-CM | POA: Diagnosis not present

## 2014-10-05 DIAGNOSIS — E7800 Pure hypercholesterolemia, unspecified: Secondary | ICD-10-CM

## 2014-10-05 DIAGNOSIS — I10 Essential (primary) hypertension: Secondary | ICD-10-CM | POA: Diagnosis not present

## 2014-10-05 DIAGNOSIS — M79641 Pain in right hand: Secondary | ICD-10-CM | POA: Diagnosis not present

## 2014-10-05 DIAGNOSIS — IMO0002 Reserved for concepts with insufficient information to code with codable children: Secondary | ICD-10-CM

## 2014-10-05 DIAGNOSIS — M79642 Pain in left hand: Secondary | ICD-10-CM

## 2014-10-05 DIAGNOSIS — E1165 Type 2 diabetes mellitus with hyperglycemia: Secondary | ICD-10-CM | POA: Diagnosis not present

## 2014-10-05 LAB — HEMOGLOBIN A1C: Hgb A1c MFr Bld: 8.5 % — ABNORMAL HIGH (ref 4.6–6.5)

## 2014-10-05 MED ORDER — BIMATOPROST 0.01 % OP SOLN: OPHTHALMIC | Status: DC

## 2014-10-05 MED ORDER — SITAGLIPTIN PHOSPHATE 100 MG PO TABS: 100.0000 mg | ORAL_TABLET | Freq: Every day | ORAL | Status: DC

## 2014-10-05 MED ORDER — DORZOLAMIDE HCL-TIMOLOL MAL 2-0.5 % OP SOLN: 1.0000 [drp] | Freq: Two times a day (BID) | OPHTHALMIC | Status: DC

## 2014-10-05 MED ORDER — SIMVASTATIN 20 MG PO TABS: ORAL_TABLET | ORAL | Status: DC

## 2014-10-05 MED ORDER — OMEPRAZOLE 20 MG PO CPDR: 20.0000 mg | DELAYED_RELEASE_CAPSULE | Freq: Every day | ORAL | Status: DC

## 2014-10-05 MED ORDER — VALSARTAN-HYDROCHLOROTHIAZIDE 160-12.5 MG PO TABS: 1.0000 | ORAL_TABLET | Freq: Every day | ORAL | Status: DC

## 2014-10-05 MED ORDER — CARVEDILOL 12.5 MG PO TABS: ORAL_TABLET | ORAL | Status: DC

## 2014-10-05 MED ORDER — PIOGLITAZONE HCL 15 MG PO TABS: ORAL_TABLET | ORAL | Status: DC

## 2014-10-05 MED ORDER — INSULIN GLARGINE 100 UNIT/ML ~~LOC~~ SOLN: SUBCUTANEOUS | Status: DC

## 2014-10-05 MED ORDER — BRIMONIDINE TARTRATE 0.1 % OP SOLN: OPHTHALMIC | Status: DC

## 2014-10-05 MED ORDER — AMLODIPINE BESYLATE 5 MG PO TABS: 5.0000 mg | ORAL_TABLET | Freq: Every day | ORAL | Status: DC

## 2014-10-05 MED ORDER — MELOXICAM 7.5 MG PO TABS: 7.5000 mg | ORAL_TABLET | Freq: Every day | ORAL | Status: DC

## 2014-10-05 MED ORDER — POTASSIUM CHLORIDE CRYS ER 20 MEQ PO TBCR: EXTENDED_RELEASE_TABLET | ORAL | Status: DC

## 2014-10-05 NOTE — Progress Notes (Signed)
Subjective:    Patient ID: Lynn Swanson, female    DOB: 06/13/1941, 73 y.o.   MRN: [id number]  HPI 73 year old African-American female, nonsmoker is in today for recheck of hypertension type 2 diabetes, obesity, and hypercholesterolemia. Reports she is doing well. Blood glucose in the 100s to 200 range. Does not routinely check them. He reports a diabetic eye exam this year. Bone density, Pap smear and mammogram were all done last week. Requesting refills on all medications. Does not routinely exercise but tries to follow healthy diet. Has not had a colonoscopy screening  Reports bilateral hand pain and it typically is worse in the mornings. Hands are stiff. Pain about a 4 out of 10 and worse with movement. Has not taken any medication for relief.  Review of Systems  HENT: Negative.   Respiratory: Negative.   Cardiovascular: Negative.   Gastrointestinal: Negative.   Endocrine: Negative.   Genitourinary: Negative.   Musculoskeletal: Negative.   Skin: Negative.   Allergic/Immunologic: Negative.   Neurological: Negative.   Hematological: Negative.   Psychiatric/Behavioral: Negative.    Past Medical History  Diagnosis Date  . Hypertension   . Glaucoma   . Diabetes mellitus     History   Social History  . Marital Status: Widowed    Spouse Name: N/A    Number of Children: N/A  . Years of Education: N/A   Occupational History  . Not on file.   Social History Main Topics  . Smoking status: Never Smoker   . Smokeless tobacco: Current User    Types: Snuff  . Alcohol Use: No  . Drug Use: No  . Sexual Activity: Not on file   Other Topics Concern  . Not on file   Social History Narrative    Past Surgical History  Procedure Laterality Date  . Abdominal hysterectomy      No family history on file.  No Known Allergies  Current Outpatient Prescriptions on File Prior to Visit  Medication Sig Dispense Refill  . amLODipine (NORVASC) 5 MG tablet TAKE 1 TABLET EVERY DAY  90 tablet 1  . B-D INS SYR ULTRAFINE 1CC/30G 30G X 1/2" 1 ML MISC INJECT 20 UNITS INTO THE SKIN DAILY. 100 each 3  . brimonidine (ALPHAGAN P) 0.1 % SOLN PLACE 1 DROP INTO BOTH EYES 2 (TWO) TIMES DAILY. 10 mL 3  . carvedilol (COREG) 12.5 MG tablet TAKE 1 TABLET TWICE A DAY WITH A MEAL 180 tablet 1  . cholecalciferol (VITAMIN D) 1000 UNITS tablet Take 1,000 Units by mouth daily.    . dorzolamide-timolol (COSOPT) 22.3-6.8 MG/ML ophthalmic solution Place 1 drop into both eyes 2 (two) times daily. 30 mL 1  . glucose blood (TRUETRACK TEST) test strip USE 1 TWO TIMES DAILY AS INSTRUCTED DX/250.02 300 each 1  . insulin glargine (LANTUS) 100 UNIT/ML injection INJECT 75 UNITS INTO THE SKIN AT BEDTIME. 30 mL 1  . JANUVIA 100 MG tablet TAKE 1 TABLET BY MOUTH DAILY 90 tablet 1  . KLOR-CON M20 20 MEQ tablet TAKE 1 TABLET (20 MEQ TOTAL) BY MOUTH 2 (TWO) TIMES DAILY. 180 tablet 1  . LUMIGAN 0.01 % SOLN PLACE 1 DROP INTO BOTH EYES AT BEDTIME. 7.5 mL 3  . omeprazole (PRILOSEC) 20 MG capsule TAKE ONE CAPSULE EVERY DAY 90 capsule 1  . pioglitazone (ACTOS) 15 MG tablet TAKE 1 TABLET (15 MG TOTAL) BY MOUTH DAILY. 90 tablet 1  . SAFETY LANCETS MISC Use as directed twice daily 100 each 6  .  simvastatin (ZOCOR) 20 MG tablet TAKE 1 TABLET (20 MG TOTAL) BY MOUTH DAILY. 90 tablet 1  . valsartan-hydrochlorothiazide (DIOVAN-HCT) 160-12.5 MG per tablet TAKE 1 TABLET BY MOUTH DAILY. 90 tablet 1   No current facility-administered medications on file prior to visit.    BP 126/78 mmHg  Pulse 97  Ht 4\' 11"  (1.499 m)  Wt 196 lb 9.6 oz (89.177 kg)  BMI 39.69 kg/m2chart    Objective:   Physical Exam  Constitutional: She is oriented to person, place, and time. She appears well-developed and well-nourished.  HENT:  Right Ear: External ear normal.  Left Ear: External ear normal.  Nose: Nose normal.  Mouth/Throat: Oropharynx is clear and moist.  Neck: Normal range of motion. Neck supple. No thyromegaly present.    Cardiovascular: Normal rate, regular rhythm and normal heart sounds.   Pulmonary/Chest: Effort normal and breath sounds normal.  Abdominal: Soft. Bowel sounds are normal.  Musculoskeletal: Normal range of motion.  Neurological: She is alert and oriented to person, place, and time.  Skin: Skin is warm and dry.  Psychiatric: She has a normal mood and affect.          Assessment & Plan:  Dhyana was seen today for follow-up.  Diagnoses and associated orders for this visit:  Type 2 diabetes, uncontrolled, with neuropathy - Basic Metabolic Panel - Hepatic Function Panel - Hemoglobin A1c - Lipid Panel  Need for prophylactic vaccination against Streptococcus pneumoniae (pneumococcus) - Pneumococcal conjugate vaccine 13-valent  Essential hypertension - Basic Metabolic Panel - Hepatic Function Panel - Hemoglobin A1c - Lipid Panel  Pure hypercholesterolemia - Lipid Panel  Bilateral hand pain  Special screening for malignant neoplasms, colon - Ambulatory referral to Gastroenterology  Other Orders - meloxicam (MOBIC) 7.5 MG tablet; Take 1 tablet (7.5 mg total) by mouth daily.    Encouraged healthy diet, exercise and self breast exams. Strive for better glycemic control. Meloxicam ordered. Advised patient to take with food.

## 2014-10-05 NOTE — Patient Instructions (Addendum)
1. Referred for colonoscopy screening 2. Start Mobic for pain in the hands. Take with food.   Osteoarthritis Osteoarthritis is a disease that causes soreness and inflammation of a joint. It occurs when the cartilage at the affected joint wears down. Cartilage acts as a cushion, covering the ends of bones where they meet to form a joint. Osteoarthritis is the most common form of arthritis. It often occurs in older people. The joints affected most often by this condition include those in the:  Ends of the fingers.  Thumbs.  Neck.  Lower back.  Knees.  Hips. CAUSES  Over time, the cartilage that covers the ends of bones begins to wear away. This causes bone to rub on bone, producing pain and stiffness in the affected joints.  RISK FACTORS Certain factors can increase your chances of having osteoarthritis, including:  Older age.  Excessive body weight.  Overuse of joints.  Previous joint injury. SIGNS AND SYMPTOMS   Pain, swelling, and stiffness in the joint.  Over time, the joint may lose its normal shape.  Small deposits of bone (osteophytes) may grow on the edges of the joint.  Bits of bone or cartilage can break off and float inside the joint space. This may cause more pain and damage. DIAGNOSIS  Your health care provider will do a physical exam and ask about your symptoms. Various tests may be ordered, such as:  X-rays of the affected joint.  An MRI scan.  Blood tests to rule out other types of arthritis.  Joint fluid tests. This involves using a needle to draw fluid from the joint and examining the fluid under a microscope. TREATMENT  Goals of treatment are to control pain and improve joint function. Treatment plans may include:  A prescribed exercise program that allows for rest and joint relief.  A weight control plan.  Pain relief techniques, such as:  Properly applied heat and cold.  Electric pulses delivered to nerve endings under the skin  (transcutaneous electrical nerve stimulation [TENS]).  Massage.  Certain nutritional supplements.  Medicines to control pain, such as:  Acetaminophen.  Nonsteroidal anti-inflammatory drugs (NSAIDs), such as naproxen.  Narcotic or central-acting agents, such as tramadol.  Corticosteroids. These can be given orally or as an injection.  Surgery to reposition the bones and relieve pain (osteotomy) or to remove loose pieces of bone and cartilage. Joint replacement may be needed in advanced states of osteoarthritis. HOME CARE INSTRUCTIONS   Take medicines only as directed by your health care provider.  Maintain a healthy weight. Follow your health care provider's instructions for weight control. This may include dietary instructions.  Exercise as directed. Your health care provider can recommend specific types of exercise. These may include:  Strengthening exercises. These are done to strengthen the muscles that support joints affected by arthritis. They can be performed with weights or with exercise bands to add resistance.  Aerobic activities. These are exercises, such as brisk walking or low-impact aerobics, that get your heart pumping.  Range-of-motion activities. These keep your joints limber.  Balance and agility exercises. These help you maintain daily living skills.  Rest your affected joints as directed by your health care provider.  Keep all follow-up visits as directed by your health care provider. SEEK MEDICAL CARE IF:   Your skin turns red.  You develop a rash in addition to your joint pain.  You have worsening joint pain.  You have a fever along with joint or muscle aches. Lynn Swanson  IF:  You have a significant loss of weight or appetite.  You have night sweats. [place] of Arthritis and Musculoskeletal and Skin Diseases: www.niams.SouthExposed.es  [employer] on Aging: http://kim-miller.com/  American College  of Rheumatology: www.rheumatology.org Document Released: 10/21/2005 Document Revised: 03/07/2014 Document Reviewed: 06/28/2013 [facility] Patient Information 2015 [place], [place]. This information is not intended to replace advice given to you by your health care provider. Make sure you discuss any questions you have with your health care provider.

## 2014-10-06 DIAGNOSIS — E11359 Type 2 diabetes mellitus with proliferative diabetic retinopathy without macular edema: Secondary | ICD-10-CM | POA: Diagnosis not present

## 2014-10-06 LAB — HEPATIC FUNCTION PANEL
ALT: 14 U/L (ref 0–35)
AST: 16 U/L (ref 0–37)
Albumin: 3.3 g/dL — ABNORMAL LOW (ref 3.5–5.2)
Alkaline Phosphatase: 59 U/L (ref 39–117)
BILIRUBIN DIRECT: 0 mg/dL (ref 0.0–0.3)
Total Bilirubin: 0.4 mg/dL (ref 0.2–1.2)
Total Protein: 7.2 g/dL (ref 6.0–8.3)

## 2014-10-06 LAB — BASIC METABOLIC PANEL
BUN: 21 mg/dL (ref 6–23)
CALCIUM: 8.8 mg/dL (ref 8.4–10.5)
CO2: 26 mEq/L (ref 19–32)
Chloride: 109 mEq/L (ref 96–112)
Creatinine, Ser: 0.7 mg/dL (ref 0.4–1.2)
GFR: 100.1 mL/min (ref >60.00)
Glucose, Bld: 250 mg/dL — ABNORMAL HIGH (ref 70–99)
POTASSIUM: 3.3 meq/L — AB (ref 3.5–5.1)
SODIUM: 135 meq/L (ref 135–145)

## 2014-10-06 LAB — LIPID PANEL
Cholesterol: 134 mg/dL (ref 0–200)
HDL: 37.5 mg/dL — ABNORMAL LOW (ref >39.00)
LDL CALC: 81 mg/dL (ref 0–99)
NonHDL: 96.5
Total CHOL/HDL Ratio: 4
Triglycerides: 78 mg/dL (ref 0.0–149.0)
VLDL: 15.6 mg/dL (ref 0.0–40.0)

## 2014-11-08 ENCOUNTER — Encounter: Payer: Self-pay | Admitting: Family

## 2014-11-17 DIAGNOSIS — I739 Peripheral vascular disease, unspecified: Secondary | ICD-10-CM | POA: Diagnosis not present

## 2014-11-17 DIAGNOSIS — L603 Nail dystrophy: Secondary | ICD-10-CM | POA: Diagnosis not present

## 2014-11-17 DIAGNOSIS — E1051 Type 1 diabetes mellitus with diabetic peripheral angiopathy without gangrene: Secondary | ICD-10-CM | POA: Diagnosis not present

## 2015-01-26 DIAGNOSIS — L603 Nail dystrophy: Secondary | ICD-10-CM | POA: Diagnosis not present

## 2015-01-26 DIAGNOSIS — I739 Peripheral vascular disease, unspecified: Secondary | ICD-10-CM | POA: Diagnosis not present

## 2015-01-26 DIAGNOSIS — E1051 Type 1 diabetes mellitus with diabetic peripheral angiopathy without gangrene: Secondary | ICD-10-CM | POA: Diagnosis not present

## 2015-02-03 ENCOUNTER — Ambulatory Visit: Payer: Medicare Other | Admitting: Family

## 2015-02-07 ENCOUNTER — Ambulatory Visit: Payer: Medicare Other | Admitting: Family

## 2015-02-21 ENCOUNTER — Encounter: Payer: Self-pay | Admitting: Adult Health

## 2015-02-21 ENCOUNTER — Ambulatory Visit (INDEPENDENT_AMBULATORY_CARE_PROVIDER_SITE_OTHER): Payer: Medicare Other | Admitting: Adult Health

## 2015-02-21 VITALS — BP 120/84 | Temp 98.1°F | Wt 197.6 lb

## 2015-02-21 DIAGNOSIS — E119 Type 2 diabetes mellitus without complications: Secondary | ICD-10-CM | POA: Diagnosis not present

## 2015-02-21 DIAGNOSIS — I10 Essential (primary) hypertension: Secondary | ICD-10-CM | POA: Diagnosis not present

## 2015-02-21 DIAGNOSIS — Z7189 Other specified counseling: Secondary | ICD-10-CM | POA: Diagnosis not present

## 2015-02-21 DIAGNOSIS — Z7689 Persons encountering health services in other specified circumstances: Secondary | ICD-10-CM

## 2015-02-21 LAB — HEMOGLOBIN A1C: HEMOGLOBIN A1C: 8 % — AB (ref 4.6–6.5)

## 2015-02-21 NOTE — Progress Notes (Signed)
Pre visit review using our clinic review tool, if applicable. No additional management support is needed unless otherwise documented below in the visit note. 

## 2015-02-21 NOTE — Patient Instructions (Addendum)
It was a pleasure meeting you today and I would like to welcome you to the team! I will get a A1c blood test today and let you know what the results show. Depending on the results we may need to change around your diabetes medication. Please start to work on getting a better diet. This will help with your overall health. Follow up in three months for another A1c and in December we will do a complete physical. If you need anything in the meantime please let me know. Also, you can start taking Ibuprofen for your hand pain. Ice may also be beneficial for the pain in your hands.   Diabetes Mellitus and Food It is important for you to manage your blood sugar (glucose) level. Your blood glucose level can be greatly affected by what you eat. Eating healthier foods in the appropriate amounts throughout the day at about the same time each day will help you control your blood glucose level. It can also help slow or prevent worsening of your diabetes mellitus. Healthy eating may even help you improve the level of your blood pressure and reach or maintain a healthy weight.  HOW CAN FOOD AFFECT ME? Carbohydrates Carbohydrates affect your blood glucose level more than any other type of food. Your dietitian will help you determine how many carbohydrates to eat at each meal and teach you how to count carbohydrates. Counting carbohydrates is important to keep your blood glucose at a healthy level, especially if you are using insulin or taking certain medicines for diabetes mellitus. Alcohol Alcohol can cause sudden decreases in blood glucose (hypoglycemia), especially if you use insulin or take certain medicines for diabetes mellitus. Hypoglycemia can be a life-threatening condition. Symptoms of hypoglycemia (sleepiness, dizziness, and disorientation) are similar to symptoms of having too much alcohol.  If your health care provider has given you approval to drink alcohol, do so in moderation and use the following  guidelines:  Women should not have more than one drink per day, and men should not have more than two drinks per day. One drink is equal to:  12 oz of beer.  5 oz of wine.  1 oz of hard liquor.  Do not drink on an empty stomach.  Keep yourself hydrated. Have water, diet soda, or unsweetened iced tea.  Regular soda, juice, and other mixers might contain a lot of carbohydrates and should be counted. WHAT FOODS ARE NOT RECOMMENDED? As you make food choices, it is important to remember that all foods are not the same. Some foods have fewer nutrients per serving than other foods, even though they might have the same number of calories or carbohydrates. It is difficult to get your body what it needs when you eat foods with fewer nutrients. Examples of foods that you should avoid that are high in calories and carbohydrates but low in nutrients include:  Trans fats (most processed foods list trans fats on the Nutrition Facts label).  Regular soda.  Juice.  Candy.  Sweets, such as cake, pie, doughnuts, and cookies.  Fried foods. WHAT FOODS CAN I EAT? Have nutrient-rich foods, which will nourish your body and keep you healthy. The food you should eat also will depend on several factors, including:  The calories you need.  The medicines you take.  Your weight.  Your blood glucose level.  Your blood pressure level.  Your cholesterol level. You also should eat a variety of foods, including:  Protein, such as meat, poultry, fish, tofu, nuts, and  seeds (lean animal proteins are best).  Fruits.  Vegetables.  Dairy products, such as milk, cheese, and yogurt (low fat is best).  Breads, grains, pasta, cereal, rice, and beans.  Fats such as olive oil, trans fat-free margarine, canola oil, avocado, and olives. DOES EVERYONE WITH DIABETES MELLITUS HAVE THE SAME MEAL PLAN? Because every person with diabetes mellitus is different, there is not one meal plan that works for everyone.  It is very important that you meet with a dietitian who will help you create a meal plan that is just right for you. Document Released: 07/18/2005 Document Revised: 10/26/2013 Document Reviewed: 09/17/2013 [facility] Patient Information 2015 [place], [place]. This information is not intended to replace advice given to you by your health care provider. Make sure you discuss any questions you have with your health care provider. Diabetes Mellitus and Food It is important for you to manage your blood sugar (glucose) level. Your blood glucose level can be greatly affected by what you eat. Eating healthier foods in the appropriate amounts throughout the day at about the same time each day will help you control your blood glucose level. It can also help slow or prevent worsening of your diabetes mellitus. Healthy eating may even help you improve the level of your blood pressure and reach or maintain a healthy weight.  HOW CAN FOOD AFFECT ME? Carbohydrates Carbohydrates affect your blood glucose level more than any other type of food. Your dietitian will help you determine how many carbohydrates to eat at each meal and teach you how to count carbohydrates. Counting carbohydrates is important to keep your blood glucose at a healthy level, especially if you are using insulin or taking certain medicines for diabetes mellitus. Alcohol Alcohol can cause sudden decreases in blood glucose (hypoglycemia), especially if you use insulin or take certain medicines for diabetes mellitus. Hypoglycemia can be a life-threatening condition. Symptoms of hypoglycemia (sleepiness, dizziness, and disorientation) are similar to symptoms of having too much alcohol.  If your health care provider has given you approval to drink alcohol, do so in moderation and use the following guidelines:  Women should not have more than one drink per day, and men should not have more than two drinks per day. One drink is equal to:  12 oz of beer.  5  oz of wine.  1 oz of hard liquor.  Do not drink on an empty stomach.  Keep yourself hydrated. Have water, diet soda, or unsweetened iced tea.  Regular soda, juice, and other mixers might contain a lot of carbohydrates and should be counted. WHAT FOODS ARE NOT RECOMMENDED? As you make food choices, it is important to remember that all foods are not the same. Some foods have fewer nutrients per serving than other foods, even though they might have the same number of calories or carbohydrates. It is difficult to get your body what it needs when you eat foods with fewer nutrients. Examples of foods that you should avoid that are high in calories and carbohydrates but low in nutrients include:  Trans fats (most processed foods list trans fats on the Nutrition Facts label).  Regular soda.  Juice.  Candy.  Sweets, such as cake, pie, doughnuts, and cookies.  Fried foods. WHAT FOODS CAN I EAT? Have nutrient-rich foods, which will nourish your body and keep you healthy. The food you should eat also will depend on several factors, including:  The calories you need.  The medicines you take.  Your weight.  Your blood glucose  level.  Your blood pressure level.  Your cholesterol level. You also should eat a variety of foods, including:  Protein, such as meat, poultry, fish, tofu, nuts, and seeds (lean animal proteins are best).  Fruits.  Vegetables.  Dairy products, such as milk, cheese, and yogurt (low fat is best).  Breads, grains, pasta, cereal, rice, and beans.  Fats such as olive oil, trans fat-free margarine, canola oil, avocado, and olives. DOES EVERYONE WITH DIABETES MELLITUS HAVE THE SAME MEAL PLAN? Because every person with diabetes mellitus is different, there is not one meal plan that works for everyone. It is very important that you meet with a dietitian who will help you create a meal plan that is just right for you. Document Released: 07/18/2005 Document Revised:  10/26/2013 Document Reviewed: 09/17/2013 [facility] Patient Information 2015 [place], [place]. This information is not intended to replace advice given to you by your health care provider. Make sure you discuss any questions you have with your health care provider.

## 2015-02-21 NOTE — Progress Notes (Signed)
Patient ID: Lynn Swanson, female   DOB: April 22, 1941, 74 y.o.   MRN: [id number]   HPI:  Jennafer Gladue is here to establish care.  Last PCP and physical: December 2015 with Ines Bloomer.  Last eye exam was December 2015 Dentist: Has dentures both uppers and lowers.     Has the following chronic problems that require follow up and concerns today:  Hypertension - Well controlled on medications - Denies any headaches, blurred vision, or dizziness.    Diabetes - Uncontrolled ranges from 100-300.  - Diet is not well controlled. She does not follow a diabetic diet and eats whatever she wants.   - Does not exercise as much as she wants too. If she does it is walking.   Pain in hands - Has occasional pain in bilateral hands. She is unable to describe what it feels like.    ROS negative for unless reported above: fevers, unintentional weight loss, hearing loss, chest pain, palpitations, struggling to breath, hemoptysis, melena, hematochezia, hematuria, falls, loc, si, thoughts of self harm  Past Medical History  Diagnosis Date  . Hypertension   . Glaucoma   . Diabetes mellitus     Past Surgical History  Procedure Laterality Date  . Abdominal hysterectomy      Family History  Problem Relation Age of Onset  . Diabetes Mother     History   Social History  . Marital Status: Widowed    Spouse Name: N/A  . Number of Children: N/A  . Years of Education: N/A   Social History Main Topics  . Smoking status: Never Smoker   . Smokeless tobacco: Former Systems developer    Types: Snuff  . Alcohol Use: No  . Drug Use: No  . Sexual Activity: Not on file   Other Topics Concern  . None   Social History Narrative   Retired    Married, husband deceased in Feb 14, 2003 with kidney failure   -Three daughters, one lives in [place], one in [place], one [place]. Three sons, one is [place], one is in [place], one passes away.    Lives with daughter   No pets.    Likes to walk and go to the park.             Current outpatient prescriptions:  .  amLODipine (NORVASC) 5 MG tablet, Take 1 tablet (5 mg total) by mouth daily., Disp: 90 tablet, Rfl: 1 .  B-D INS SYR ULTRAFINE 1CC/30G 30G X 1/2" 1 ML MISC, INJECT 20 UNITS INTO THE SKIN DAILY., Disp: 100 each, Rfl: 3 .  bimatoprost (LUMIGAN) 0.01 % SOLN, PLACE 1 DROP INTO BOTH EYES AT BEDTIME., Disp: 7.5 mL, Rfl: 3 .  brimonidine (ALPHAGAN P) 0.1 % SOLN, PLACE 1 DROP INTO BOTH EYES 2 (TWO) TIMES DAILY., Disp: 10 mL, Rfl: 3 .  carvedilol (COREG) 12.5 MG tablet, TAKE 1 TABLET TWICE A DAY WITH A MEAL, Disp: 180 tablet, Rfl: 1 .  dorzolamide-timolol (COSOPT) 22.3-6.8 MG/ML ophthalmic solution, Place 1 drop into both eyes 2 (two) times daily., Disp: 30 mL, Rfl: 1 .  glucose blood (TRUETRACK TEST) test strip, USE 1 TWO TIMES DAILY AS INSTRUCTED DX/250.02, Disp: 300 each, Rfl: 1 .  insulin glargine (LANTUS) 100 UNIT/ML injection, INJECT 75 UNITS INTO THE SKIN AT BEDTIME., Disp: 30 mL, Rfl: 1 .  omeprazole (PRILOSEC) 20 MG capsule, Take 1 capsule (20 mg total) by mouth daily., Disp: 90 capsule, Rfl: 1 .  pioglitazone (ACTOS) 15 MG tablet, TAKE 1 TABLET (15  MG TOTAL) BY MOUTH DAILY., Disp: 90 tablet, Rfl: 1 .  potassium chloride SA (KLOR-CON M20) 20 MEQ tablet, TAKE 1 TABLET (20 MEQ TOTAL) BY MOUTH 2 (TWO) TIMES DAILY., Disp: 180 tablet, Rfl: 1 .  SAFETY LANCETS MISC, Use as directed twice daily, Disp: 100 each, Rfl: 6 .  simvastatin (ZOCOR) 20 MG tablet, TAKE 1 TABLET (20 MG TOTAL) BY MOUTH DAILY., Disp: 90 tablet, Rfl: 1 .  sitaGLIPtin (JANUVIA) 100 MG tablet, Take 1 tablet (100 mg total) by mouth daily., Disp: 90 tablet, Rfl: 1 .  valsartan-hydrochlorothiazide (DIOVAN-HCT) 160-12.5 MG per tablet, Take 1 tablet by mouth daily., Disp: 90 tablet, Rfl: 1 .  cholecalciferol (VITAMIN D) 1000 UNITS tablet, Take 1,000 Units by mouth daily., Disp: , Rfl:  .  meloxicam (MOBIC) 7.5 MG tablet, Take 1 tablet (7.5 mg total) by mouth daily. (Patient not taking:  Reported on 02/21/2015), Disp: 90 tablet, Rfl: 1  EXAM:  Filed Vitals:   02/21/15 1440  BP: 120/84  Temp: 98.1 F (36.7 C)    Body mass index is 39.89 kg/(m^2).  GENERAL: vitals reviewed and listed above, alert, oriented, appears well hydrated and in no acute distress. Obese  HEENT: atraumatic, conjunttiva clear, pupils equal round and reactive, has glaucoma, no obvious abnormalities on inspection of external nose and ears.   NECK: no obvious masses on inspection  LUNGS: clear to auscultation bilaterally, no wheezes, rales or rhonchi, good air movement  CV: HRRR, no peripheral edema  MS: moves all extremities without noticeable abnormality  PSYCH: pleasant and cooperative, no obvious depression or anxiety  ASSESSMENT AND PLAN:  Discussed the following assessment and plan:  1. Type 2 diabetes mellitus without complication - Not well controlled due to patients unwillingness to control her diet.  - She needs to work on exercising more - Hemoglobin A1c - Will continue to work on trying to convince her to change her dietary habits.  - Will inform patient of her A1c -Follow up in three months for A1c  2. Encounter to establish care Follow up every three months for A1c - CPX in December -Follow up as needed  3. Essential hypertension - Seems to be well controlled on current regimen.    -We reviewed the PMH, PSH, FH, SH, Meds and Allergies. -We provided refills for any medications we will prescribe as needed. -We addressed current concerns per orders and patient instructions. -We have advised patient to follow up per instructions below.   -Patient advised to return or notify a provider immediately if symptoms worsen or persist or new concerns arise.  Patient Instructions  It was a pleasure meeting you today and I would like to welcome you to the team! I will get a A1c blood test today and let you know what the results show. Depending on the results we may need to  change around your diabetes medication. Please start to work on getting a better diet. This will help with your overall health. Follow up in three months for another A1c and in December we will do a complete physical. If you need anything in the meantime please let me know. Also, you can start taking Ibuprofen for your hand pain. Ice may also be beneficial for the pain in your hands.   Diabetes Mellitus and Food It is important for you to manage your blood sugar (glucose) level. Your blood glucose level can be greatly affected by what you eat. Eating healthier foods in the appropriate amounts throughout the day at about  the same time each day will help you control your blood glucose level. It can also help slow or prevent worsening of your diabetes mellitus. Healthy eating may even help you improve the level of your blood pressure and reach or maintain a healthy weight.  HOW CAN FOOD AFFECT ME? Carbohydrates Carbohydrates affect your blood glucose level more than any other type of food. Your dietitian will help you determine how many carbohydrates to eat at each meal and teach you how to count carbohydrates. Counting carbohydrates is important to keep your blood glucose at a healthy level, especially if you are using insulin or taking certain medicines for diabetes mellitus. Alcohol Alcohol can cause sudden decreases in blood glucose (hypoglycemia), especially if you use insulin or take certain medicines for diabetes mellitus. Hypoglycemia can be a life-threatening condition. Symptoms of hypoglycemia (sleepiness, dizziness, and disorientation) are similar to symptoms of having too much alcohol.  If your health care provider has given you approval to drink alcohol, do so in moderation and use the following guidelines:  Women should not have more than one drink per day, and men should not have more than two drinks per day. One drink is equal to:  12 oz of beer.  5 oz of wine.  1 oz of hard  liquor.  Do not drink on an empty stomach.  Keep yourself hydrated. Have water, diet soda, or unsweetened iced tea.  Regular soda, juice, and other mixers might contain a lot of carbohydrates and should be counted. WHAT FOODS ARE NOT RECOMMENDED? As you make food choices, it is important to remember that all foods are not the same. Some foods have fewer nutrients per serving than other foods, even though they might have the same number of calories or carbohydrates. It is difficult to get your body what it needs when you eat foods with fewer nutrients. Examples of foods that you should avoid that are high in calories and carbohydrates but low in nutrients include:  Trans fats (most processed foods list trans fats on the Nutrition Facts label).  Regular soda.  Juice.  Candy.  Sweets, such as cake, pie, doughnuts, and cookies.  Fried foods. WHAT FOODS CAN I EAT? Have nutrient-rich foods, which will nourish your body and keep you healthy. The food you should eat also will depend on several factors, including:  The calories you need.  The medicines you take.  Your weight.  Your blood glucose level.  Your blood pressure level.  Your cholesterol level. You also should eat a variety of foods, including:  Protein, such as meat, poultry, fish, tofu, nuts, and seeds (lean animal proteins are best).  Fruits.  Vegetables.  Dairy products, such as milk, cheese, and yogurt (low fat is best).  Breads, grains, pasta, cereal, rice, and beans.  Fats such as olive oil, trans fat-free margarine, canola oil, avocado, and olives. DOES EVERYONE WITH DIABETES MELLITUS HAVE THE SAME MEAL PLAN? Because every person with diabetes mellitus is different, there is not one meal plan that works for everyone. It is very important that you meet with a dietitian who will help you create a meal plan that is just right for you. Document Released: 07/18/2005 Document Revised: 10/26/2013 Document Reviewed:  09/17/2013 [facility] Patient Information 2015 [place], [place]. This information is not intended to replace advice given to you by your health care provider. Make sure you discuss any questions you have with your health care provider. Diabetes Mellitus and Food It is important for you to manage  your blood sugar (glucose) level. Your blood glucose level can be greatly affected by what you eat. Eating healthier foods in the appropriate amounts throughout the day at about the same time each day will help you control your blood glucose level. It can also help slow or prevent worsening of your diabetes mellitus. Healthy eating may even help you improve the level of your blood pressure and reach or maintain a healthy weight.  HOW CAN FOOD AFFECT ME? Carbohydrates Carbohydrates affect your blood glucose level more than any other type of food. Your dietitian will help you determine how many carbohydrates to eat at each meal and teach you how to count carbohydrates. Counting carbohydrates is important to keep your blood glucose at a healthy level, especially if you are using insulin or taking certain medicines for diabetes mellitus. Alcohol Alcohol can cause sudden decreases in blood glucose (hypoglycemia), especially if you use insulin or take certain medicines for diabetes mellitus. Hypoglycemia can be a life-threatening condition. Symptoms of hypoglycemia (sleepiness, dizziness, and disorientation) are similar to symptoms of having too much alcohol.  If your health care provider has given you approval to drink alcohol, do so in moderation and use the following guidelines:  Women should not have more than one drink per day, and men should not have more than two drinks per day. One drink is equal to:  12 oz of beer.  5 oz of wine.  1 oz of hard liquor.  Do not drink on an empty stomach.  Keep yourself hydrated. Have water, diet soda, or unsweetened iced tea.  Regular soda, juice, and other mixers might  contain a lot of carbohydrates and should be counted. WHAT FOODS ARE NOT RECOMMENDED? As you make food choices, it is important to remember that all foods are not the same. Some foods have fewer nutrients per serving than other foods, even though they might have the same number of calories or carbohydrates. It is difficult to get your body what it needs when you eat foods with fewer nutrients. Examples of foods that you should avoid that are high in calories and carbohydrates but low in nutrients include:  Trans fats (most processed foods list trans fats on the Nutrition Facts label).  Regular soda.  Juice.  Candy.  Sweets, such as cake, pie, doughnuts, and cookies.  Fried foods. WHAT FOODS CAN I EAT? Have nutrient-rich foods, which will nourish your body and keep you healthy. The food you should eat also will depend on several factors, including:  The calories you need.  The medicines you take.  Your weight.  Your blood glucose level.  Your blood pressure level.  Your cholesterol level. You also should eat a variety of foods, including:  Protein, such as meat, poultry, fish, tofu, nuts, and seeds (lean animal proteins are best).  Fruits.  Vegetables.  Dairy products, such as milk, cheese, and yogurt (low fat is best).  Breads, grains, pasta, cereal, rice, and beans.  Fats such as olive oil, trans fat-free margarine, canola oil, avocado, and olives. DOES EVERYONE WITH DIABETES MELLITUS HAVE THE SAME MEAL PLAN? Because every person with diabetes mellitus is different, there is not one meal plan that works for everyone. It is very important that you meet with a dietitian who will help you create a meal plan that is just right for you. Document Released: 07/18/2005 Document Revised: 10/26/2013 Document Reviewed: 09/17/2013 [facility] Patient Information 2015 [place], [place]. This information is not intended to replace advice given to you by  your health care provider. Make sure  you discuss any questions you have with your health care provider.      [employer]

## 2015-02-23 ENCOUNTER — Telehealth: Payer: Self-pay | Admitting: Family

## 2015-04-12 ENCOUNTER — Other Ambulatory Visit: Payer: Self-pay | Admitting: Family

## 2015-04-12 NOTE — Telephone Encounter (Signed)
This is cory pt now

## 2015-04-25 ENCOUNTER — Encounter (HOSPITAL_BASED_OUTPATIENT_CLINIC_OR_DEPARTMENT_OTHER): Payer: Self-pay | Admitting: Emergency Medicine

## 2015-04-25 ENCOUNTER — Emergency Department (HOSPITAL_BASED_OUTPATIENT_CLINIC_OR_DEPARTMENT_OTHER)
Admission: EM | Admit: 2015-04-25 | Discharge: 2015-04-25 | Disposition: A | Discharge status: Home / Self Care | Payer: Medicare Other | Attending: Emergency Medicine | Admitting: Emergency Medicine

## 2015-04-25 DIAGNOSIS — Z79899 Other long term (current) drug therapy: Secondary | ICD-10-CM | POA: Insufficient documentation

## 2015-04-25 DIAGNOSIS — E119 Type 2 diabetes mellitus without complications: Secondary | ICD-10-CM | POA: Diagnosis not present

## 2015-04-25 DIAGNOSIS — R300 Dysuria: Secondary | ICD-10-CM | POA: Diagnosis present

## 2015-04-25 DIAGNOSIS — H409 Unspecified glaucoma: Secondary | ICD-10-CM | POA: Insufficient documentation

## 2015-04-25 DIAGNOSIS — I10 Essential (primary) hypertension: Secondary | ICD-10-CM | POA: Insufficient documentation

## 2015-04-25 DIAGNOSIS — N390 Urinary tract infection, site not specified: Secondary | ICD-10-CM | POA: Diagnosis not present

## 2015-04-25 DIAGNOSIS — Z794 Long term (current) use of insulin: Secondary | ICD-10-CM | POA: Insufficient documentation

## 2015-04-25 LAB — URINALYSIS, ROUTINE W REFLEX MICROSCOPIC
Bilirubin Urine: NEGATIVE
Glucose, UA: NEGATIVE mg/dL
Ketones, ur: NEGATIVE mg/dL
Nitrite: NEGATIVE
Protein, ur: 30 mg/dL — AB
Specific Gravity, Urine: 1.014 (ref 1.005–1.030)
UROBILINOGEN UA: 1 mg/dL (ref 0.0–1.0)
pH: 5.5 (ref 5.0–8.0)

## 2015-04-25 LAB — URINE MICROSCOPIC-ADD ON

## 2015-04-25 MED ORDER — PHENAZOPYRIDINE HCL 100 MG PO TABS
100.0000 mg | ORAL_TABLET | Freq: Once | ORAL | Status: AC
  Administered 2015-04-25: 100 mg via ORAL
  Filled 2015-04-25: qty 1

## 2015-04-25 MED ORDER — SULFAMETHOXAZOLE-TRIMETHOPRIM 800-160 MG PO TABS
1.0000 | ORAL_TABLET | Freq: Once | ORAL | Status: AC
  Administered 2015-04-25: 1 via ORAL
  Filled 2015-04-25: qty 1

## 2015-04-25 MED ORDER — SULFAMETHOXAZOLE-TRIMETHOPRIM 800-160 MG PO TABS: 1.0000 | ORAL_TABLET | Freq: Two times a day (BID) | ORAL | Status: DC

## 2015-04-25 MED ORDER — PHENAZOPYRIDINE HCL 200 MG PO TABS: 200.0000 mg | ORAL_TABLET | Freq: Three times a day (TID) | ORAL | Status: DC

## 2015-04-25 NOTE — Discharge Instructions (Signed)

## 2015-04-25 NOTE — ED Provider Notes (Signed)
CSN: [id number]     Arrival date & time 04/25/15  1845 History   This chart was scribed for Lynn Furry, MD by Julien Nordmann, ED Scribe. This patient was seen in room MH07/MH07 and the patient's care was started at 7:11 PM.    Chief Complaint  Patient presents with  . Dysuria     Patient is a 74 y.o. female presenting with dysuria. The history is provided by the patient.  Dysuria  HPI Comments: Lynn Swanson is a 74 y.o. female who has a hx of UTI and herpes presents to the Emergency Department complaining of painful urination onset yesterday. Pt has associated urinary frequency. Pt notes this feels similar to a UTI she had previously. Pt denies nausea, vomiting, fevers, diarrhea  Past Medical History  Diagnosis Date  . Hypertension   . Glaucoma   . Diabetes mellitus    Past Surgical History  Procedure Laterality Date  . Abdominal hysterectomy     Family History  Problem Relation Age of Onset  . Diabetes Mother    History  Substance Use Topics  . Smoking status: Never Smoker   . Smokeless tobacco: Former Systems developer    Types: Snuff  . Alcohol Use: No   OB History    No data available     Review of Systems  Genitourinary: Positive for dysuria.      Allergies  Review of patient's allergies indicates no known allergies.  Home Medications   Prior to Admission medications   Medication Sig Start Date End Date Taking? Authorizing Provider  amLODipine (NORVASC) 5 MG tablet TAKE 1 TABLET (5 MG TOTAL) BY MOUTH DAILY. 04/13/15   Dorothyann Peng, NP  B-D INS SYR ULTRAFINE 1CC/30G 30G X 1/2" 1 ML MISC INJECT 20 UNITS INTO THE SKIN DAILY. 04/13/15   Dorothyann Peng, NP  bimatoprost (LUMIGAN) 0.01 % SOLN PLACE 1 DROP INTO BOTH EYES AT BEDTIME. 10/05/14   Kennyth Arnold, FNP  brimonidine (ALPHAGAN P) 0.1 % SOLN PLACE 1 DROP INTO BOTH EYES 2 (TWO) TIMES DAILY. 10/05/14   Kennyth Arnold, FNP  carvedilol (COREG) 12.5 MG tablet TAKE 1 TABLET BY MOUTH TWICE A DAY WITH A MEAL 04/13/15   Dorothyann Peng,  NP  cholecalciferol (VITAMIN D) 1000 UNITS tablet Take 1,000 Units by mouth daily.    Historical Provider, MD  dorzolamide-timolol (COSOPT) 22.3-6.8 MG/ML ophthalmic solution Place 1 drop into both eyes 2 (two) times daily. 10/05/14   Kennyth Arnold, FNP  glucose blood (TRUETRACK TEST) test strip USE 1 TWO TIMES DAILY AS INSTRUCTED DX/250.02 07/06/14   Kennyth Arnold, FNP  JANUVIA 100 MG tablet TAKE 1 TABLET (100 MG TOTAL) BY MOUTH DAILY. 04/13/15   Dorothyann Peng, NP  KLOR-CON M20 20 MEQ tablet TAKE 1 TABLET (20 MEQ TOTAL) BY MOUTH 2 (TWO) TIMES DAILY. 04/13/15   Dorothyann Peng, NP  LANTUS 100 UNIT/ML injection INJECT 75 UNITS INTO THE SKIN AT BEDTIME. 04/13/15   Dorothyann Peng, NP  meloxicam (MOBIC) 7.5 MG tablet Take 1 tablet (7.5 mg total) by mouth daily. Patient not taking: Reported on 02/21/2015 10/05/14   Kennyth Arnold, FNP  omeprazole (PRILOSEC) 20 MG capsule TAKE 1 CAPSULE (20 MG TOTAL) BY MOUTH DAILY. 04/13/15   Dorothyann Peng, NP  phenazopyridine (PYRIDIUM) 200 MG tablet Take 1 tablet (200 mg total) by mouth 3 (three) times daily. 04/25/15   Lynn Furry, MD  pioglitazone (ACTOS) 15 MG tablet TAKE 1 TABLET (15 MG TOTAL) BY MOUTH DAILY. 04/13/15  Dorothyann Peng, NP  SAFETY LANCETS MISC Use as directed twice daily 04/16/12   Kennyth Arnold, FNP  simvastatin (ZOCOR) 20 MG tablet TAKE 1 TABLET BY MOUTH EVERY DAY 04/13/15   Dorothyann Peng, NP  sulfamethoxazole-trimethoprim (BACTRIM DS,SEPTRA DS) 800-160 MG per tablet Take 1 tablet by mouth 2 (two) times daily. 04/25/15   Lynn Furry, MD  valsartan-hydrochlorothiazide (DIOVAN-HCT) 160-12.5 MG per tablet TAKE 1 TABLET BY MOUTH EVERY DAY 04/13/15   Dorothyann Peng, NP   Triage vitals: BP 124/54 mmHg  Pulse 76  Temp(Src) 98.7 F (37.1 C) (Oral)  Resp 18  Ht 5' (1.524 m)  Wt 200 lb (90.719 kg)  BMI 39.06 kg/m2  SpO2 100% Physical Exam  Constitutional: She is oriented to person, place, and time. She appears well-developed and well-nourished. No distress.  HENT:  Head:  Normocephalic.  Eyes: Conjunctivae are normal. Pupils are equal, round, and reactive to light. No scleral icterus.  Neck: Normal range of motion. Neck supple. No thyromegaly present.  Cardiovascular: Normal rate and regular rhythm.  Exam reveals no gallop and no friction rub.   No murmur heard. Pulmonary/Chest: Effort normal and breath sounds normal. No respiratory distress. She has no wheezes. She has no rales.  Abdominal: Soft. Bowel sounds are normal. She exhibits no distension. There is no tenderness. There is no rebound.  Tenderness in suprapubic abdomen  Musculoskeletal: Normal range of motion.  Neurological: She is alert and oriented to person, place, and time.  Skin: Skin is warm and dry. No rash noted.  Psychiatric: She has a normal mood and affect. Her behavior is normal.  Nursing note and vitals reviewed.   ED Course  Procedures  DIAGNOSTIC STUDIES: Oxygen Saturation is 100% on RA, normal by my interpretation.  COORDINATION OF CARE: 7:14 PM Discussed treatment plan which includes check urinalysis with pt at bedside and pt agreed to plan.  Labs Review Labs Reviewed  URINALYSIS, ROUTINE W REFLEX MICROSCOPIC (NOT AT [facility]) - Abnormal; Notable for the following:    APPearance CLOUDY (*)    Hgb urine dipstick MODERATE (*)    Protein, ur 30 (*)    Leukocytes, UA LARGE (*)    All other components within normal limits  URINE MICROSCOPIC-ADD ON - Abnormal; Notable for the following:    Squamous Epithelial / LPF FEW (*)    Bacteria, UA FEW (*)    All other components within normal limits    Imaging Review No results found.   EKG Interpretation None      MDM   Final diagnoses:  UTI (lower urinary tract infection)    I personally performed the services described in this documentation, which was scribed in my presence. The recorded information has been reviewed and is accurate.   Lynn Furry, MD 04/25/15 [phone]

## 2015-04-25 NOTE — ED Notes (Signed)
Pt in c/o urinary frequency x 1 day, hx of UTI.

## 2015-05-23 ENCOUNTER — Encounter: Payer: Self-pay | Admitting: Adult Health

## 2015-05-23 ENCOUNTER — Ambulatory Visit: Payer: Medicare Other | Admitting: Adult Health

## 2015-05-23 ENCOUNTER — Ambulatory Visit (INDEPENDENT_AMBULATORY_CARE_PROVIDER_SITE_OTHER): Payer: Medicare Other | Admitting: Adult Health

## 2015-05-23 VITALS — BP 128/80 | Temp 98.4°F | Ht 60.0 in | Wt 200.4 lb

## 2015-05-23 DIAGNOSIS — E1165 Type 2 diabetes mellitus with hyperglycemia: Secondary | ICD-10-CM

## 2015-05-23 DIAGNOSIS — R300 Dysuria: Secondary | ICD-10-CM

## 2015-05-23 DIAGNOSIS — I10 Essential (primary) hypertension: Secondary | ICD-10-CM | POA: Diagnosis not present

## 2015-05-23 DIAGNOSIS — IMO0002 Reserved for concepts with insufficient information to code with codable children: Secondary | ICD-10-CM

## 2015-05-23 LAB — BASIC METABOLIC PANEL
BUN: 15 mg/dL (ref 6–23)
CO2: 30 mEq/L (ref 19–32)
Calcium: 9.2 mg/dL (ref 8.4–10.5)
Chloride: 105 mEq/L (ref 96–112)
Creatinine, Ser: 0.98 mg/dL (ref 0.40–1.20)
GFR: 71.13 mL/min (ref >60.00)
Glucose, Bld: 103 mg/dL — ABNORMAL HIGH (ref 70–99)
Potassium: 3.7 mEq/L (ref 3.5–5.1)
SODIUM: 139 meq/L (ref 135–145)

## 2015-05-23 LAB — POCT URINALYSIS DIPSTICK
Bilirubin, UA: NEGATIVE
Glucose, UA: NEGATIVE
KETONES UA: NEGATIVE
Leukocytes, UA: NEGATIVE
Nitrite, UA: NEGATIVE
PH UA: 6
Protein, UA: NEGATIVE
RBC UA: NEGATIVE
SPEC GRAV UA: 1.01
Urobilinogen, UA: 1

## 2015-05-23 LAB — HEMOGLOBIN A1C: Hgb A1c MFr Bld: 6.9 % — ABNORMAL HIGH (ref 4.6–6.5)

## 2015-05-23 NOTE — Patient Instructions (Signed)
I will follow up with you regarding your labs. Please start eating a healthy diabetic diet, as this will really help lower your blood sugars.

## 2015-05-23 NOTE — Progress Notes (Signed)
Pre visit review using our clinic review tool, if applicable. No additional management support is needed unless otherwise documented below in the visit note. 

## 2015-05-23 NOTE — Progress Notes (Signed)
   Subjective:    Patient ID: Lynn Swanson, female    DOB: 04/13/1941, 74 y.o.   MRN: [id number]  HPI  74 year old female with the  has a past medical history of Hypertension; Glaucoma; and Diabetes mellitus. Is here for three month follow up related to her diabetes and hypertension. She continues to be noncompliant with her diet, but takes her medications as directed.   She was recently seen and treated in the ER for UTI. She has finished her antibiotics.No longer having urinary symptoms.   Has no other complaints at this time   Review of Systems  Constitutional: Negative.   Respiratory: Negative.   Cardiovascular: Negative.   Gastrointestinal: Negative.   Genitourinary: Negative.   Musculoskeletal: Negative.   Skin: Negative.   Neurological: Negative.   All other systems reviewed and are negative.      Objective:   Physical Exam  Constitutional: She is oriented to person, place, and time. She appears well-developed and well-nourished. No distress.  HENT:  Head: Normocephalic and atraumatic.  Right Ear: External ear normal.  Left Ear: External ear normal.  Nose: Nose normal.  Mouth/Throat: Oropharynx is clear and moist. No oropharyngeal exudate.  Eyes: Conjunctivae and EOM are normal. Pupils are equal, round, and reactive to light. Right eye exhibits no discharge. Left eye exhibits no discharge.  Cardiovascular: Normal rate, regular rhythm, normal heart sounds and intact distal pulses.  Exam reveals no gallop and no friction rub.   No murmur heard. Pulmonary/Chest: Effort normal and breath sounds normal. No respiratory distress. She has no wheezes. She has no rales. She exhibits no tenderness.  Musculoskeletal: Normal range of motion. She exhibits no edema or tenderness.  Neurological: She is alert and oriented to person, place, and time.  Skin: Skin is warm and dry. No rash noted. She is not diaphoretic. No erythema. No pallor.  Psychiatric: She has a normal mood and  affect. Her behavior is normal. Judgment and thought content normal.  Nursing note and vitals reviewed.     Assessment & Plan:   1. Diabetes mellitus type II, uncontrolled - Basic metabolic panel - Hemoglobin A1c - Stressed importance of eating a diabetic diet and doing exercise.  - Follow up in three months.  - Will follow up with regarding labs - Consider changing medications  2. Essential hypertension, benign - Controlled with current medication regimen- No changes - Monitor blood pressure at home  3. Dysuria - POCT urinalysis dipstick; Standing - POCT urinalysis dipstick

## 2015-06-07 ENCOUNTER — Telehealth: Payer: Self-pay | Admitting: Adult Health

## 2015-06-07 MED ORDER — DORZOLAMIDE HCL 2 % OP SOLN: OPHTHALMIC | Status: AC

## 2015-06-07 NOTE — Telephone Encounter (Signed)
Pt request refill dorzolamide (TRUSOPT) 2 % ophthalmic solution  Pt is completely out and needs today if possible. 90 day supply  [facility] [address]

## 2015-06-07 NOTE — Telephone Encounter (Signed)
Pt's daughter is aware 

## 2015-06-07 NOTE — Telephone Encounter (Signed)
Rx sent to pharmacy   

## 2015-07-03 DIAGNOSIS — L603 Nail dystrophy: Secondary | ICD-10-CM | POA: Diagnosis not present

## 2015-07-03 DIAGNOSIS — E1051 Type 1 diabetes mellitus with diabetic peripheral angiopathy without gangrene: Secondary | ICD-10-CM | POA: Diagnosis not present

## 2015-07-03 DIAGNOSIS — G9009 Other idiopathic peripheral autonomic neuropathy: Secondary | ICD-10-CM | POA: Diagnosis not present

## 2015-07-03 DIAGNOSIS — I739 Peripheral vascular disease, unspecified: Secondary | ICD-10-CM | POA: Diagnosis not present

## 2015-08-17 ENCOUNTER — Encounter: Payer: Self-pay | Admitting: Adult Health

## 2015-08-23 ENCOUNTER — Ambulatory Visit (INDEPENDENT_AMBULATORY_CARE_PROVIDER_SITE_OTHER): Payer: Medicare Other | Admitting: Adult Health

## 2015-08-23 ENCOUNTER — Encounter: Payer: Self-pay | Admitting: Adult Health

## 2015-08-23 VITALS — BP 108/74 | Temp 98.3°F | Ht 60.0 in | Wt 204.4 lb

## 2015-08-23 DIAGNOSIS — Z760 Encounter for issue of repeat prescription: Secondary | ICD-10-CM | POA: Diagnosis not present

## 2015-08-23 DIAGNOSIS — E1165 Type 2 diabetes mellitus with hyperglycemia: Secondary | ICD-10-CM

## 2015-08-23 DIAGNOSIS — IMO0001 Reserved for inherently not codable concepts without codable children: Secondary | ICD-10-CM

## 2015-08-23 DIAGNOSIS — Z794 Long term (current) use of insulin: Secondary | ICD-10-CM | POA: Diagnosis not present

## 2015-08-23 LAB — BASIC METABOLIC PANEL
BUN: 18 mg/dL (ref 6–23)
CALCIUM: 9.1 mg/dL (ref 8.4–10.5)
CO2: 28 meq/L (ref 19–32)
CREATININE: 0.92 mg/dL (ref 0.40–1.20)
Chloride: 107 mEq/L (ref 96–112)
GFR: 76.46 mL/min (ref >60.00)
Glucose, Bld: 133 mg/dL — ABNORMAL HIGH (ref 70–99)
POTASSIUM: 3.7 meq/L (ref 3.5–5.1)
Sodium: 142 mEq/L (ref 135–145)

## 2015-08-23 LAB — HEMOGLOBIN A1C: HEMOGLOBIN A1C: 6.6 % — AB (ref 4.6–6.5)

## 2015-08-23 MED ORDER — "INSULIN SYRINGE-NEEDLE U-100 30G X 1/2"" 1 ML MISC": Status: DC

## 2015-08-23 MED ORDER — SIMVASTATIN 20 MG PO TABS: 20.0000 mg | ORAL_TABLET | Freq: Every day | ORAL | Status: DC

## 2015-08-23 MED ORDER — INSULIN GLARGINE 100 UNIT/ML ~~LOC~~ SOLN: SUBCUTANEOUS | Status: DC

## 2015-08-23 MED ORDER — PIOGLITAZONE HCL 15 MG PO TABS: ORAL_TABLET | ORAL | Status: DC

## 2015-08-23 MED ORDER — VALSARTAN-HYDROCHLOROTHIAZIDE 160-12.5 MG PO TABS: 1.0000 | ORAL_TABLET | Freq: Every day | ORAL | Status: DC

## 2015-08-23 MED ORDER — AMLODIPINE BESYLATE 5 MG PO TABS: ORAL_TABLET | ORAL | Status: DC

## 2015-08-23 MED ORDER — OMEPRAZOLE 20 MG PO CPDR: DELAYED_RELEASE_CAPSULE | ORAL | Status: DC

## 2015-08-23 MED ORDER — POTASSIUM CHLORIDE CRYS ER 20 MEQ PO TBCR: EXTENDED_RELEASE_TABLET | ORAL | Status: DC

## 2015-08-23 MED ORDER — GLUCOSE BLOOD VI STRP: ORAL_STRIP | Status: DC

## 2015-08-23 MED ORDER — SITAGLIPTIN PHOSPHATE 100 MG PO TABS: ORAL_TABLET | ORAL | Status: DC

## 2015-08-23 MED ORDER — CARVEDILOL 12.5 MG PO TABS: ORAL_TABLET | ORAL | Status: DC

## 2015-08-23 NOTE — Progress Notes (Signed)
Subjective:    Patient ID: Lynn Swanson, female    DOB: 1941-10-28, 74 y.o.   MRN: [id number]  HPI  74 year old female with the  has a past medical history of Hypertension; Glaucoma; and Diabetes mellitus. Is here for three month follow up related to her diabetes and hypertension. She continues to be noncompliant with her diet, but takes her medications as directed. . She checks her blood sugars at night. They have been running anywhere between 110-180. Her daughter is present at this visit.   She also needs all of her medications refilled.    Has no other complaints.   Review of Systems  Constitutional: Negative.   HENT: Negative.   Eyes: Negative.   Respiratory: Negative.   Cardiovascular: Negative.   Gastrointestinal: Negative.   Genitourinary: Negative.   Musculoskeletal: Negative.   Neurological: Negative.   All other systems reviewed and are negative.  Past Medical History  Diagnosis Date  . Hypertension   . Glaucoma   . Diabetes mellitus     Social History   Social History  . Marital Status: Widowed    Spouse Name: N/A  . Number of Children: N/A  . Years of Education: N/A   Occupational History  . Not on file.   Social History Main Topics  . Smoking status: Never Smoker   . Smokeless tobacco: Former Systems developer    Types: Snuff  . Alcohol Use: No  . Drug Use: No  . Sexual Activity: Not on file   Other Topics Concern  . Not on file   Social History Narrative   Retired    Married, husband deceased in 03/02/2003 with kidney failure   -Three daughters, one lives in [place], one in [place], one [place]. Three sons, one is [place], one is in [place], one passes away.    Lives with daughter   No pets.    Likes to walk and go to the park.           Past Surgical History  Procedure Laterality Date  . Abdominal hysterectomy      Family History  Problem Relation Age of Onset  . Diabetes Mother     No Known Allergies  Current Outpatient Prescriptions on  File Prior to Visit  Medication Sig Dispense Refill  . bimatoprost (LUMIGAN) 0.01 % SOLN PLACE 1 DROP INTO BOTH EYES AT BEDTIME. 7.5 mL 3  . brimonidine (ALPHAGAN P) 0.1 % SOLN PLACE 1 DROP INTO BOTH EYES 2 (TWO) TIMES DAILY. 10 mL 3  . cholecalciferol (VITAMIN D) 1000 UNITS tablet Take 1,000 Units by mouth daily.    . dorzolamide (TRUSOPT) 2 % ophthalmic solution PLACE 1 DROP IN BOTH EYES TWICE A DAY 10 mL 1  . dorzolamide-timolol (COSOPT) 22.3-6.8 MG/ML ophthalmic solution Place 1 drop into both eyes 2 (two) times daily. 30 mL 1  . meloxicam (MOBIC) 7.5 MG tablet Take 1 tablet (7.5 mg total) by mouth daily. 90 tablet 1  . phenazopyridine (PYRIDIUM) 200 MG tablet Take 1 tablet (200 mg total) by mouth 3 (three) times daily. 6 tablet 0   No current facility-administered medications on file prior to visit.    BP 108/74 mmHg  Temp(Src) 98.3 F (36.8 C) (Oral)  Ht 5' (1.524 m)  Wt 204 lb 6.4 oz (92.715 kg)  BMI 39.92 kg/m2       Objective:   Physical Exam  Constitutional: She is oriented to person, place, and time. She appears well-developed and well-nourished. No distress.  Cardiovascular: Normal rate, regular rhythm, normal heart sounds and intact distal pulses.  Exam reveals no gallop and no friction rub.   No murmur heard. Pulmonary/Chest: Effort normal and breath sounds normal. No respiratory distress. She has no wheezes. She has no rales. She exhibits no tenderness.  Abdominal: Soft. Bowel sounds are normal. She exhibits no distension and no mass. There is no tenderness. There is no rebound and no guarding.  Neurological: She is alert and oriented to person, place, and time.  Skin: Skin is warm and dry. No rash noted. She is not diaphoretic. No erythema. No pallor.  Psychiatric: She has a normal mood and affect. Her behavior is normal. Judgment and thought content normal.  Nursing note and vitals reviewed.      Assessment & Plan:  1. Uncontrolled type 2 diabetes mellitus  without complication, with long-term current use of insulin (HCC) - sitaGLIPtin (JANUVIA) 100 MG tablet; TAKE 1 TABLET (100 MG TOTAL) BY MOUTH DAILY.  Dispense: 90 tablet; Refill: 3 - pioglitazone (ACTOS) 15 MG tablet; TAKE 1 TABLET (15 MG TOTAL) BY MOUTH DAILY.  Dispense: 90 tablet; Refill: 3 - Insulin Syringe-Needle U-100 (B-D INS SYR ULTRAFINE 1CC/30G) 30G X 1/2" 1 ML MISC; INJECT 20 UNITS INTO THE SKIN DAILY.  Dispense: 100 each; Refill: 11 - insulin glargine (LANTUS) 100 UNIT/ML injection; INJECT 75 UNITS INTO THE SKIN AT BEDTIME.  Dispense: 30 mL; Refill: 11 - glucose blood (TRUETRACK TEST) test strip; USE 1 TWO TIMES DAILY AS INSTRUCTED DX/250.02  Dispense: 300 each; Refill: 1 - Hemoglobin O1H - Basic metabolic panel - Will follow up regarding lab work    2. Medication refill - carvedilol (COREG) 12.5 MG tablet; TAKE 1 TABLET BY MOUTH TWICE A DAY WITH A MEAL  Dispense: 180 tablet; Refill: 3 - potassium chloride SA (KLOR-CON M20) 20 MEQ tablet; TAKE 1 TABLET (20 MEQ TOTAL) BY MOUTH 2 (TWO) TIMES DAILY.  Dispense: 180 tablet; Refill: 3 - sitaGLIPtin (JANUVIA) 100 MG tablet; TAKE 1 TABLET (100 MG TOTAL) BY MOUTH DAILY.  Dispense: 90 tablet; Refill: 3 - pioglitazone (ACTOS) 15 MG tablet; TAKE 1 TABLET (15 MG TOTAL) BY MOUTH DAILY.  Dispense: 90 tablet; Refill: 3 - simvastatin (ZOCOR) 20 MG tablet; Take 1 tablet (20 mg total) by mouth daily.  Dispense: 90 tablet; Refill: 3 - amLODipine (NORVASC) 5 MG tablet; TAKE 1 TABLET (5 MG TOTAL) BY MOUTH DAILY.  Dispense: 90 tablet; Refill: 3 - omeprazole (PRILOSEC) 20 MG capsule; TAKE 1 CAPSULE (20 MG TOTAL) BY MOUTH DAILY.  Dispense: 90 capsule; Refill: 3 - Insulin Syringe-Needle U-100 (B-D INS SYR ULTRAFINE 1CC/30G) 30G X 1/2" 1 ML MISC; INJECT 20 UNITS INTO THE SKIN DAILY.  Dispense: 100 each; Refill: 11 - insulin glargine (LANTUS) 100 UNIT/ML injection; INJECT 75 UNITS INTO THE SKIN AT BEDTIME.  Dispense: 30 mL; Refill: 11 -  valsartan-hydrochlorothiazide (DIOVAN-HCT) 160-12.5 MG tablet; Take 1 tablet by mouth daily.  Dispense: 90 tablet; Refill: 3 - glucose blood (TRUETRACK TEST) test strip; USE 1 TWO TIMES DAILY AS INSTRUCTED DX/250.02  Dispense: 300 each; Refill: 1

## 2015-08-23 NOTE — Patient Instructions (Signed)
It was great seeing you again!  I will follow up with you regarding your labs  Please work on diet!!

## 2015-09-21 DIAGNOSIS — E1051 Type 1 diabetes mellitus with diabetic peripheral angiopathy without gangrene: Secondary | ICD-10-CM | POA: Diagnosis not present

## 2015-09-21 DIAGNOSIS — I739 Peripheral vascular disease, unspecified: Secondary | ICD-10-CM | POA: Diagnosis not present

## 2015-09-21 DIAGNOSIS — L603 Nail dystrophy: Secondary | ICD-10-CM | POA: Diagnosis not present

## 2015-10-09 ENCOUNTER — Telehealth: Payer: Self-pay | Admitting: Adult Health

## 2015-10-09 NOTE — Telephone Encounter (Signed)
Noted  

## 2015-10-09 NOTE — Telephone Encounter (Signed)
Pt daughter call to say pt might have a bladder infection. I used SDA 3 pm for tomorrow 10/10/15

## 2015-10-10 ENCOUNTER — Ambulatory Visit: Payer: Medicare Other | Admitting: Adult Health

## 2015-10-11 ENCOUNTER — Ambulatory Visit (INDEPENDENT_AMBULATORY_CARE_PROVIDER_SITE_OTHER): Payer: Medicare Other | Admitting: Adult Health

## 2015-10-11 ENCOUNTER — Encounter: Payer: Self-pay | Admitting: Adult Health

## 2015-10-11 VITALS — BP 118/70 | Temp 98.4°F | Ht 60.0 in | Wt 204.7 lb

## 2015-10-11 DIAGNOSIS — N3001 Acute cystitis with hematuria: Secondary | ICD-10-CM | POA: Diagnosis not present

## 2015-10-11 DIAGNOSIS — R300 Dysuria: Secondary | ICD-10-CM

## 2015-10-11 DIAGNOSIS — N3000 Acute cystitis without hematuria: Secondary | ICD-10-CM | POA: Diagnosis not present

## 2015-10-11 LAB — POCT URINALYSIS DIPSTICK
Bilirubin, UA: NEGATIVE
CLARITY UA: NEGATIVE
Color, UA: 0.5
GLUCOSE UA: NEGATIVE
Ketones, UA: NEGATIVE
NITRITE UA: POSITIVE
Protein, UA: NEGATIVE
Spec Grav, UA: 1.02
Urobilinogen, UA: 1
pH, UA: 7.5

## 2015-10-11 MED ORDER — CIPROFLOXACIN HCL 500 MG PO TABS: 500.0000 mg | ORAL_TABLET | Freq: Two times a day (BID) | ORAL | Status: DC

## 2015-10-11 NOTE — Patient Instructions (Addendum)
You have a urinary tract infection. I have sent in a prescription for Cipro, this will be the antibiotic that you will use to help get rid of the UTI.   Please drink a lot of water while taking this medication   Follow up if you do not feel any better once finishing the medication    Urinary Tract Infection Urinary tract infections (UTIs) can develop anywhere along your urinary tract. Your urinary tract is your body's drainage system for removing wastes and extra water. Your urinary tract includes two kidneys, two ureters, a bladder, and a urethra. Your kidneys are a pair of bean-shaped organs. Each kidney is about the size of your fist. They are located below your ribs, one on each side of your spine. CAUSES Infections are caused by microbes, which are microscopic organisms, including fungi, viruses, and bacteria. These organisms are so small that they can only be seen through a microscope. Bacteria are the microbes that most commonly cause UTIs. SYMPTOMS  Symptoms of UTIs may vary by age and gender of the patient and by the location of the infection. Symptoms in young women typically include a frequent and intense urge to urinate and a painful, burning feeling in the bladder or urethra during urination. Older women and men are more likely to be tired, shaky, and weak and have muscle aches and abdominal pain. A fever may mean the infection is in your kidneys. Other symptoms of a kidney infection include pain in your back or sides below the ribs, nausea, and vomiting. DIAGNOSIS To diagnose a UTI, your caregiver will ask you about your symptoms. Your caregiver will also ask you to provide a urine sample. The urine sample will be tested for bacteria and white blood cells. White blood cells are made by your body to help fight infection. TREATMENT  Typically, UTIs can be treated with medication. Because most UTIs are caused by a bacterial infection, they usually can be treated with the use of  antibiotics. The choice of antibiotic and length of treatment depend on your symptoms and the type of bacteria causing your infection. HOME CARE INSTRUCTIONS  If you were prescribed antibiotics, take them exactly as your caregiver instructs you. Finish the medication even if you feel better after you have only taken some of the medication.  Drink enough water and fluids to keep your urine clear or pale yellow.  Avoid caffeine, tea, and carbonated beverages. They tend to irritate your bladder.  Empty your bladder often. Avoid holding urine for long periods of time.  Empty your bladder before and after sexual intercourse.  After a bowel movement, women should cleanse from front to back. Use each tissue only once. SEEK MEDICAL CARE IF:   You have back pain.  You develop a fever.  Your symptoms do not begin to resolve within 3 days. SEEK IMMEDIATE MEDICAL CARE IF:   You have severe back pain or lower abdominal pain.  You develop chills.  You have nausea or vomiting.  You have continued burning or discomfort with urination. MAKE SURE YOU:   Understand these instructions.  Will watch your condition.  Will get help right away if you are not doing well or get worse.   This information is not intended to replace advice given to you by your health care provider. Make sure you discuss any questions you have with your health care provider.   Document Released: 07/31/2005 Document Revised: 07/12/2015 Document Reviewed: 11/29/2011 Elsevier Interactive Patient Education [employer].

## 2015-10-11 NOTE — Progress Notes (Signed)
Pre visit review using our clinic review tool, if applicable. No additional management support is needed unless otherwise documented below in the visit note. 

## 2015-10-11 NOTE — Progress Notes (Signed)
[id number] Lynn Malay, NP Chief Complaint  Patient presents with  . Dysuria    x Monday     Current Issues:  Presents with 3 days of dysuria, urinary urgency and urinary frequency Associated symptoms include:  dysuria, urinary frequency, urinary hesitancy and urinary urgency  There is a previous history of of similar symptoms. Sexually active:  No   No concern for STI.  Prior to Admission medications   Medication Sig Start Date End Date Taking? Authorizing Provider  amLODipine (NORVASC) 5 MG tablet TAKE 1 TABLET (5 MG TOTAL) BY MOUTH DAILY. 08/23/15  Yes Dorothyann Peng, NP  bimatoprost (LUMIGAN) 0.01 % SOLN PLACE 1 DROP INTO BOTH EYES AT BEDTIME. 10/05/14  Yes Kennyth Arnold, FNP  brimonidine (ALPHAGAN P) 0.1 % SOLN PLACE 1 DROP INTO BOTH EYES 2 (TWO) TIMES DAILY. 10/05/14  Yes Kennyth Arnold, FNP  carvedilol (COREG) 12.5 MG tablet TAKE 1 TABLET BY MOUTH TWICE A DAY WITH A MEAL 08/23/15  Yes Dorothyann Peng, NP  cholecalciferol (VITAMIN D) 1000 UNITS tablet Take 1,000 Units by mouth daily.   Yes Historical Provider, MD  dorzolamide (TRUSOPT) 2 % ophthalmic solution PLACE 1 DROP IN BOTH EYES TWICE A DAY 06/07/15  Yes Dorothyann Peng, NP  dorzolamide-timolol (COSOPT) 22.3-6.8 MG/ML ophthalmic solution Place 1 drop into both eyes 2 (two) times daily. 10/05/14  Yes Kennyth Arnold, FNP  glucose blood (TRUETRACK TEST) test strip USE 1 TWO TIMES DAILY AS INSTRUCTED DX/250.02 08/23/15  Yes Dorothyann Peng, NP  insulin glargine (LANTUS) 100 UNIT/ML injection INJECT 75 UNITS INTO THE SKIN AT BEDTIME. 08/23/15  Yes Dorothyann Peng, NP  Insulin Syringe-Needle U-100 (B-D INS SYR ULTRAFINE 1CC/30G) 30G X 1/2" 1 ML MISC INJECT 20 UNITS INTO THE SKIN DAILY. 08/23/15  Yes Dorothyann Peng, NP  meloxicam (MOBIC) 7.5 MG tablet Take 1 tablet (7.5 mg total) by mouth daily. 10/05/14  Yes Kennyth Arnold, FNP  omeprazole (PRILOSEC) 20 MG capsule TAKE 1 CAPSULE (20 MG TOTAL) BY MOUTH DAILY. 08/23/15  Yes Dorothyann Peng, NP   phenazopyridine (PYRIDIUM) 200 MG tablet Take 1 tablet (200 mg total) by mouth 3 (three) times daily. 04/25/15  Yes Tanna Furry, MD  pioglitazone (ACTOS) 15 MG tablet TAKE 1 TABLET (15 MG TOTAL) BY MOUTH DAILY. 08/23/15  Yes Dorothyann Peng, NP  potassium chloride SA (KLOR-CON M20) 20 MEQ tablet TAKE 1 TABLET (20 MEQ TOTAL) BY MOUTH 2 (TWO) TIMES DAILY. 08/23/15  Yes Dorothyann Peng, NP  simvastatin (ZOCOR) 20 MG tablet Take 1 tablet (20 mg total) by mouth daily. 08/23/15  Yes Deavion Strider, NP  sitaGLIPtin (JANUVIA) 100 MG tablet TAKE 1 TABLET (100 MG TOTAL) BY MOUTH DAILY. 08/23/15  Yes Dorothyann Peng, NP  valsartan-hydrochlorothiazide (DIOVAN-HCT) 160-12.5 MG tablet Take 1 tablet by mouth daily. 08/23/15  Yes Dorothyann Peng, NP    Review of Systems:  PE:  BP 118/70 mmHg  Temp(Src) 98.4 F (36.9 C) (Oral)  Ht 5' (1.524 m)  Wt 204 lb 11.2 oz (92.851 kg)  BMI 39.98 kg/m2 Constitutional: Alert and oriented and in no acute distress Heart: heart rate regular rhythm, no murmur, gallop or rub Lungs: Clear to auscultation Back: No CVA tenderness Abdomen: No pain with palpation, mass, rebound tenderness or distention  Results for orders placed or performed in visit on 08/23/15  Hemoglobin A1c  Result Value Ref Range   Hgb A1c MFr Bld 6.6 (H) 4.6 - 6.5 %  Basic metabolic panel  Result Value Ref Range   Sodium 142  135 - 145 mEq/L   Potassium 3.7 3.5 - 5.1 mEq/L   Chloride 107 96 - 112 mEq/L   CO2 28 19 - 32 mEq/L   Glucose, Bld 133 (H) 70 - 99 mg/dL   BUN 18 6 - 23 mg/dL   Creatinine, Ser 0.92 0.40 - 1.20 mg/dL   Calcium 9.1 8.4 - 10.5 mg/dL   GFR 76.46 >60.00 mL/min    Assessment and Plan:    1. Acute cystitis with hematuria [N30.01] - Urine culture - ciprofloxacin (CIPRO) 500 MG tablet; Take 1 tablet (500 mg total) by mouth 2 (two) times daily.  Dispense: 10 tablet; Refill: 0  2. Dysuria  - POCT urinalysis dipstick; Standing + blood, leuks, nitrate - POCT urinalysis dipstick

## 2015-10-14 LAB — URINE CULTURE: Colony Count: >=100000

## 2015-10-17 ENCOUNTER — Other Ambulatory Visit: Payer: Self-pay | Admitting: Family

## 2015-11-17 ENCOUNTER — Telehealth: Payer: Self-pay | Admitting: Adult Health

## 2015-11-17 DIAGNOSIS — Z01419 Encounter for gynecological examination (general) (routine) without abnormal findings: Secondary | ICD-10-CM | POA: Diagnosis not present

## 2015-11-17 DIAGNOSIS — Z6841 Body Mass Index (BMI) 40.0 and over, adult: Secondary | ICD-10-CM | POA: Diagnosis not present

## 2015-11-17 DIAGNOSIS — Z1231 Encounter for screening mammogram for malignant neoplasm of breast: Secondary | ICD-10-CM | POA: Diagnosis not present

## 2015-11-17 NOTE — Telephone Encounter (Signed)
Pt daughter called to ask if her Mom need an appt. She said she was not sure if she should be seeing the doctor every 6 months or every 3 months.

## 2015-11-17 NOTE — Telephone Encounter (Signed)
If she is doing well, then we can see her every six months

## 2015-11-21 NOTE — Telephone Encounter (Signed)
Left a detailed message for pt's daughter at designated cell phone number.

## 2015-11-22 ENCOUNTER — Encounter: Payer: Self-pay | Admitting: Adult Health

## 2015-12-04 DIAGNOSIS — E1151 Type 2 diabetes mellitus with diabetic peripheral angiopathy without gangrene: Secondary | ICD-10-CM | POA: Diagnosis not present

## 2015-12-04 DIAGNOSIS — L84 Corns and callosities: Secondary | ICD-10-CM | POA: Diagnosis not present

## 2015-12-04 DIAGNOSIS — I739 Peripheral vascular disease, unspecified: Secondary | ICD-10-CM | POA: Diagnosis not present

## 2015-12-04 DIAGNOSIS — L603 Nail dystrophy: Secondary | ICD-10-CM | POA: Diagnosis not present

## 2016-02-02 ENCOUNTER — Encounter: Payer: Self-pay | Admitting: Adult Health

## 2016-02-02 ENCOUNTER — Ambulatory Visit (INDEPENDENT_AMBULATORY_CARE_PROVIDER_SITE_OTHER): Payer: Medicare Other | Admitting: Adult Health

## 2016-02-02 DIAGNOSIS — Z23 Encounter for immunization: Secondary | ICD-10-CM | POA: Diagnosis not present

## 2016-02-02 DIAGNOSIS — E7800 Pure hypercholesterolemia, unspecified: Secondary | ICD-10-CM

## 2016-02-02 DIAGNOSIS — Z0000 Encounter for general adult medical examination without abnormal findings: Secondary | ICD-10-CM | POA: Diagnosis not present

## 2016-02-02 DIAGNOSIS — E1165 Type 2 diabetes mellitus with hyperglycemia: Secondary | ICD-10-CM | POA: Diagnosis not present

## 2016-02-02 DIAGNOSIS — Z794 Long term (current) use of insulin: Secondary | ICD-10-CM

## 2016-02-02 DIAGNOSIS — I10 Essential (primary) hypertension: Secondary | ICD-10-CM | POA: Diagnosis not present

## 2016-02-02 DIAGNOSIS — IMO0001 Reserved for inherently not codable concepts without codable children: Secondary | ICD-10-CM

## 2016-02-02 LAB — HEPATIC FUNCTION PANEL
ALT: 12 U/L (ref 0–35)
AST: 13 U/L (ref 0–37)
Albumin: 3.8 g/dL (ref 3.5–5.2)
Alkaline Phosphatase: 73 U/L (ref 39–117)
BILIRUBIN DIRECT: 0.1 mg/dL (ref 0.0–0.3)
Total Bilirubin: 0.2 mg/dL (ref 0.2–1.2)
Total Protein: 7 g/dL (ref 6.0–8.3)

## 2016-02-02 LAB — CBC WITH DIFFERENTIAL/PLATELET
BASOS ABS: 0 10*3/uL (ref 0.0–0.1)
Basophils Relative: 0.4 % (ref 0.0–3.0)
Eosinophils Absolute: 0.1 10*3/uL (ref 0.0–0.7)
Eosinophils Relative: 1.5 % (ref 0.0–5.0)
HCT: 34.9 % — ABNORMAL LOW (ref 36.0–46.0)
Hemoglobin: 11.4 g/dL — ABNORMAL LOW (ref 12.0–15.0)
LYMPHS ABS: 1.9 10*3/uL (ref 0.7–4.0)
Lymphocytes Relative: 38.7 % (ref 12.0–46.0)
MCHC: 32.6 g/dL (ref 30.0–36.0)
MCV: 87.5 fl (ref 78.0–100.0)
Monocytes Absolute: 0.4 10*3/uL (ref 0.1–1.0)
Monocytes Relative: 8.9 % (ref 3.0–12.0)
NEUTROS ABS: 2.4 10*3/uL (ref 1.4–7.7)
NEUTROS PCT: 50.5 % (ref 43.0–77.0)
PLATELETS: 217 10*3/uL (ref 150.0–400.0)
RBC: 3.99 Mil/uL (ref 3.87–5.11)
RDW: 16.1 % — ABNORMAL HIGH (ref 11.5–15.5)
WBC: 4.8 10*3/uL (ref 4.0–10.5)

## 2016-02-02 LAB — POCT URINALYSIS DIPSTICK
BILIRUBIN UA: NEGATIVE
Blood, UA: NEGATIVE
Glucose, UA: NEGATIVE
KETONES UA: NEGATIVE
Leukocytes, UA: NEGATIVE
Nitrite, UA: NEGATIVE
Protein, UA: NEGATIVE
Spec Grav, UA: 1.02
Urobilinogen, UA: 0.2
pH, UA: 7

## 2016-02-02 LAB — LIPID PANEL
Cholesterol: 154 mg/dL (ref 0–200)
HDL: 42.6 mg/dL (ref >39.00)
LDL Cholesterol: 97 mg/dL (ref 0–99)
NonHDL: 111.71
TRIGLYCERIDES: 76 mg/dL (ref 0.0–149.0)
Total CHOL/HDL Ratio: 4
VLDL: 15.2 mg/dL (ref 0.0–40.0)

## 2016-02-02 LAB — BASIC METABOLIC PANEL
BUN: 14 mg/dL (ref 6–23)
CALCIUM: 9.1 mg/dL (ref 8.4–10.5)
CO2: 30 meq/L (ref 19–32)
Chloride: 110 mEq/L (ref 96–112)
Creatinine, Ser: 0.64 mg/dL (ref 0.40–1.20)
GFR: 116.09 mL/min (ref >60.00)
Glucose, Bld: 67 mg/dL — ABNORMAL LOW (ref 70–99)
Potassium: 4.4 mEq/L (ref 3.5–5.1)
Sodium: 144 mEq/L (ref 135–145)

## 2016-02-02 LAB — HEMOGLOBIN A1C: HEMOGLOBIN A1C: 6.1 % (ref 4.6–6.5)

## 2016-02-02 LAB — TSH: TSH: 1.13 u[IU]/mL (ref 0.35–4.50)

## 2016-02-02 NOTE — Patient Instructions (Signed)
It was great seeing you again!  I will follow up with you regarding your blood work.   The most important thing you can do for your health right now, is to go on a diabetic diet.   Follow up with me in 6 months.

## 2016-02-02 NOTE — Progress Notes (Signed)
Pre visit review using our clinic review tool, if applicable. No additional management support is needed unless otherwise documented below in the visit note. 

## 2016-02-02 NOTE — Progress Notes (Signed)
Subjective:  Patient presents today for their annual wellness visit. She has a  has a past medical history of Hypertension; Glaucoma; Diabetes mellitus; and HSV (herpes simplex virus) infection. Her daughter presents with her to this visit.  She feels as though her diabetes is well managed and reports that her blood sugars are continuously below 140. He has not changed her diet, continues to follow diet, eats a lot of carbs as well as a lot of sweets. She lost 4 pounds since last visit  Blood pressure is well controlled with current medication regimen  BP Readings from Last 3 Encounters:  02/02/16 110/72  10/11/15 118/70  08/23/15 108/74     Preventive Screening-Counseling & Management  Smoking Status: Never Smoker Second Hand Smoking status: No smokers in home  Risk Factors Regular exercise: Does not exercise regularly Diet: Does not follow a diabetic diet Fall Risk: None   Cardiac risk factors:  advanced age (older than 76 for men, 51 for women)  Hyperlipidemia Diabetes. Family History: Diabetes - Mother  Depression Screen None. PHQ2 0   Activities of Daily Living - He needs assistance with driving,  as well as bathing, and managing money and cooking meals  Hearing Difficulties: patient declines  Cognitive Testing No reported trouble.   Normal 3 word recall  List the Names of Other Physician/Practitioners you currently use: 1.Dr. Sherlynn Stalls - Retina 2. Dr. Heather [place] - Eye 3. Dr. Harlow Mares - Foot 4. Dr. Marylynn Pearson - OBGYN   Immunization History  Administered Date(s) Administered  . Influenza Split 07/10/2012  . Influenza, High Dose Seasonal PF 09/27/2013, 10/05/2014  . Pneumococcal Conjugate-13 10/05/2014  . Td 01/25/2014   Required Immunizations needed today Pneumovax 23.  Screening tests- up to date Health Maintenance Due  Topic Date Due  . COLONOSCOPY  04/17/1990  . ZOSTAVAX  04/17/2000  . OPHTHALMOLOGY EXAM  10/06/2015   . PNA vac Low Risk Adult (2 of 2 - PPSV23) 10/06/2015    ROS- No pertinent positives discovered in course of AWV  The following were reviewed and entered/updated in epic: Past Medical History  Diagnosis Date  . Hypertension   . Glaucoma   . Diabetes mellitus   . HSV (herpes simplex virus) infection    Patient Active Problem List   Diagnosis Date Noted  . Diabetes mellitus type II, uncontrolled ([place]) 12/24/2012  . Essential hypertension, benign 12/24/2012  . Obesity, unspecified 12/24/2012  . Hypercholesteremia 12/24/2012   Past Surgical History  Procedure Laterality Date  . Abdominal hysterectomy      Family History  Problem Relation Age of Onset  . Diabetes Mother     Medications- reviewed and updated Current Outpatient Prescriptions  Medication Sig Dispense Refill  . amLODipine (NORVASC) 5 MG tablet TAKE 1 TABLET (5 MG TOTAL) BY MOUTH DAILY. 90 tablet 3  . carvedilol (COREG) 12.5 MG tablet TAKE 1 TABLET BY MOUTH TWICE A DAY WITH A MEAL 180 tablet 3  . dorzolamide (TRUSOPT) 2 % ophthalmic solution PLACE 1 DROP IN BOTH EYES TWICE A DAY 10 mL 1  . glucose blood (TRUETRACK TEST) test strip USE 1 TWO TIMES DAILY AS INSTRUCTED DX/250.02 300 each 1  . insulin glargine (LANTUS) 100 UNIT/ML injection INJECT 75 UNITS INTO THE SKIN AT BEDTIME. 30 mL 11  . Insulin Syringe-Needle U-100 (B-D INS SYR ULTRAFINE 1CC/30G) 30G X 1/2" 1 ML MISC INJECT 20 UNITS INTO THE SKIN DAILY. 100 each 11  . LUMIGAN 0.01 % SOLN PLACE 1  DROP INTO BOTH EYES AT BEDTIME. 7.5 mL 3  . omeprazole (PRILOSEC) 20 MG capsule TAKE 1 CAPSULE (20 MG TOTAL) BY MOUTH DAILY. 90 capsule 3  . pioglitazone (ACTOS) 15 MG tablet TAKE 1 TABLET (15 MG TOTAL) BY MOUTH DAILY. 90 tablet 3  . potassium chloride SA (KLOR-CON M20) 20 MEQ tablet TAKE 1 TABLET (20 MEQ TOTAL) BY MOUTH 2 (TWO) TIMES DAILY. 180 tablet 3  . simvastatin (ZOCOR) 20 MG tablet Take 1 tablet (20 mg total) by mouth daily. 90 tablet 3  . sitaGLIPtin (JANUVIA)  100 MG tablet TAKE 1 TABLET (100 MG TOTAL) BY MOUTH DAILY. 90 tablet 3  . valsartan-hydrochlorothiazide (DIOVAN-HCT) 160-12.5 MG tablet Take 1 tablet by mouth daily. 90 tablet 3   No current facility-administered medications for this visit.    Allergies-reviewed and updated No Known Allergies  Social History   Social History  . Marital Status: Widowed    Spouse Name: N/A  . Number of Children: N/A  . Years of Education: N/A   Social History Main Topics  . Smoking status: Never Smoker   . Smokeless tobacco: Former Systems developer    Types: Snuff  . Alcohol Use: No  . Drug Use: No  . Sexual Activity: Not Asked   Other Topics Concern  . None   Social History Narrative   Retired    Married, husband deceased in 2003/02/28 with kidney failure   -Three daughters, one lives in [place], one in [place], one [place]. Three sons, one is [place], one is in [place], one passes away.    Lives with daughter   No pets.    Likes to walk and go to the park.           Objective: BP 110/72 mmHg  Temp(Src) 98.1 F (36.7 C) (Oral)  Ht 5' (1.524 m)  Wt 200 lb 11.2 oz (91.037 kg)  BMI 39.20 kg/m2 GENERAL: vitals reviewed and listed above, alert, oriented, appears well hydrated and in no acute distress. Obese  HEENT: atraumatic, conjunttiva clear, pupils equal round and reactive, has glaucoma, no obvious abnormalities on inspection of external nose and ears.   NECK: no obvious masses on inspection. Trachea midline, no masses felt on thyroid  LUNGS: clear to auscultation bilaterally, no wheezes, rales or rhonchi, good air movement  CV: HRRR, +1 pitting edema lower extremities. No carotid bruit  MS: moves all extremities without noticeable abnormality  ABD: No tenderness, distention, rebound, or masses. Bowel sounds normoactive in all 4 quadrants  PSYCH: pleasant and cooperative, no obvious depression or anxiety  Assessment/Plan: 1. Medicare annual wellness visit, subsequent - Follow-up in 1  year for next Medicare wellness exam - Follow-up sooner if needed -Stressed importance of diabetic diet as well as exercise  2. Essential hypertension, benign - EKG 12-Lead- SR, poor R wave progression, rate 63. This is consistent with prior EKG - Basic metabolic panel - CBC with Differential/Platelet - Hepatic function panel - Lipid panel - POCT urinalysis dipstick - TSH - Her blood pressure is controlled on current medication 3. Uncontrolled type 2 diabetes mellitus without complication, with long-term current use of insulin (HCC) - EKG XX123456 - Basic metabolic panel - CBC with Differential/Platelet - Hemoglobin A1c - Hepatic function panel - Lipid panel - POCT urinalysis dipstick - TSH - Eats to follow diabetic diet, cut out carbs and sweets 4. Hypercholesteremia - EKG XX123456 - Basic metabolic panel - CBC with Differential/Platelet - Hemoglobin A1c - Hepatic function panel - Lipid panel - POCT  urinalysis dipstick - TSH  5. Need for 23-polyvalent pneumococcal polysaccharide vaccine - Pneumococcal polysaccharide vaccine 23-valent greater than or equal to 2yo subcutaneous/IM   Return precautions advised.    Dorothyann Peng, NP

## 2016-02-15 DIAGNOSIS — E1151 Type 2 diabetes mellitus with diabetic peripheral angiopathy without gangrene: Secondary | ICD-10-CM | POA: Diagnosis not present

## 2016-02-15 DIAGNOSIS — I739 Peripheral vascular disease, unspecified: Secondary | ICD-10-CM | POA: Diagnosis not present

## 2016-02-15 DIAGNOSIS — L603 Nail dystrophy: Secondary | ICD-10-CM | POA: Diagnosis not present

## 2016-02-22 ENCOUNTER — Telehealth: Payer: Self-pay | Admitting: General Practice

## 2016-02-23 NOTE — Telephone Encounter (Signed)
Left message for patient to return phone call.  

## 2016-02-23 NOTE — Telephone Encounter (Signed)
Have her call her glaucoma doctor so that we have the right presciption

## 2016-02-27 ENCOUNTER — Other Ambulatory Visit: Payer: Self-pay | Admitting: Adult Health

## 2016-02-27 MED ORDER — BRIMONIDINE TARTRATE 0.1 % OP SOLN: 1.0000 [drp] | Freq: Two times a day (BID) | OPHTHALMIC | Status: AC

## 2016-02-27 NOTE — Telephone Encounter (Signed)
Spoke with patient's daughter - she states that she needs the alphagan. She wants to know if it can be refilled for just one month supply while she get's all eye drop meds taken over by glaucoma doctor?

## 2016-02-27 NOTE — Telephone Encounter (Signed)
Thanks

## 2016-02-27 NOTE — Telephone Encounter (Signed)
Prescription for eye drop sent in.

## 2016-03-29 ENCOUNTER — Ambulatory Visit (INDEPENDENT_AMBULATORY_CARE_PROVIDER_SITE_OTHER): Payer: Medicare Other | Admitting: Adult Health

## 2016-03-29 ENCOUNTER — Encounter: Payer: Self-pay | Admitting: Adult Health

## 2016-03-29 VITALS — BP 138/82 | Temp 97.9°F | Wt 202.0 lb

## 2016-03-29 DIAGNOSIS — M79604 Pain in right leg: Secondary | ICD-10-CM | POA: Diagnosis not present

## 2016-03-29 DIAGNOSIS — R600 Localized edema: Secondary | ICD-10-CM | POA: Diagnosis not present

## 2016-03-29 MED ORDER — FUROSEMIDE 20 MG PO TABS: 20.0000 mg | ORAL_TABLET | ORAL | Status: DC

## 2016-03-29 MED ORDER — METHYLPREDNISOLONE ACETATE 80 MG/ML IJ SUSP
120.0000 mg | Freq: Once | INTRAMUSCULAR | Status: AC
  Administered 2016-03-29: 120 mg via INTRAMUSCULAR

## 2016-03-29 MED ORDER — KETOROLAC TROMETHAMINE 60 MG/2ML IM SOLN
60.0000 mg | Freq: Once | INTRAMUSCULAR | Status: AC
  Administered 2016-03-29: 60 mg via INTRAMUSCULAR

## 2016-03-29 NOTE — Patient Instructions (Addendum)
It was great seeing you again!  I have given you a shot of Toradol and a shot of Prednisone- these should help with the pain.   I have also sent in a prescription for a medication called Lasix. I want you to take this every other day for a week.   Increase your water intake and decrease the amount of salt.   Also elevate your legs above your heart when you are sitting down   You can take 600mg  of Ibuprofen every 8 hours as needed for pain    Follow up with me if the pain is not improved.

## 2016-04-01 NOTE — Progress Notes (Signed)
Subjective:    Patient ID: Lynn Swanson, female    DOB: 1941-02-21, 75 y.o.   MRN: [id number]  HPI  75 year old female, who presents to the office today with the complaint of right leg pain x 2 weeks. Her grandson presents to this appointment with her.   Lynn Swanson reports that she has had right leg pain in the area of the shin with occasional right hip pain. The pain is worse with pressure and movement. She denies any trauma to the area.   She denies any pain in her calf, redness, warmth or increased swelling.   The pain is described as " dull" she has not been taking any medication for the pain. She has found that it has been harder for her to walk as of late.   Review of Systems  Constitutional: Positive for activity change.  Respiratory: Negative.   Cardiovascular: Negative.   Musculoskeletal: Positive for myalgias and gait problem. Negative for joint swelling.  Hematological: Negative.   All other systems reviewed and are negative.  Past Medical History  Diagnosis Date  . Hypertension   . Glaucoma   . Diabetes mellitus   . HSV (herpes simplex virus) infection     Social History   Social History  . Marital Status: Widowed    Spouse Name: N/A  . Number of Children: N/A  . Years of Education: N/A   Occupational History  . Not on file.   Social History Main Topics  . Smoking status: Never Smoker   . Smokeless tobacco: Former Systems developer    Types: Snuff  . Alcohol Use: No  . Drug Use: No  . Sexual Activity: Not on file   Other Topics Concern  . Not on file   Social History Narrative   Retired    Married, husband deceased in 02-15-03 with kidney failure   -Three daughters, one lives in [place], one in [place], one [place]. Three sons, one is [place], one is in [place], one passes away.    Lives with daughter   No pets.    Likes to walk and go to the park.           Past Surgical History  Procedure Laterality Date  . Abdominal hysterectomy      Family History   Problem Relation Age of Onset  . Diabetes Mother     No Known Allergies  Current Outpatient Prescriptions on File Prior to Visit  Medication Sig Dispense Refill  . amLODipine (NORVASC) 5 MG tablet TAKE 1 TABLET (5 MG TOTAL) BY MOUTH DAILY. 90 tablet 3  . brimonidine (ALPHAGAN P) 0.1 % SOLN Place 1 drop into both eyes 2 (two) times daily. 15 mL 0  . carvedilol (COREG) 12.5 MG tablet TAKE 1 TABLET BY MOUTH TWICE A DAY WITH A MEAL 180 tablet 3  . dorzolamide (TRUSOPT) 2 % ophthalmic solution PLACE 1 DROP IN BOTH EYES TWICE A DAY 10 mL 1  . glucose blood (TRUETRACK TEST) test strip USE 1 TWO TIMES DAILY AS INSTRUCTED DX/250.02 300 each 1  . insulin glargine (LANTUS) 100 UNIT/ML injection INJECT 75 UNITS INTO THE SKIN AT BEDTIME. 30 mL 11  . Insulin Syringe-Needle U-100 (B-D INS SYR ULTRAFINE 1CC/30G) 30G X 1/2" 1 ML MISC INJECT 20 UNITS INTO THE SKIN DAILY. 100 each 11  . LUMIGAN 0.01 % SOLN PLACE 1 DROP INTO BOTH EYES AT BEDTIME. 7.5 mL 3  . omeprazole (PRILOSEC) 20 MG capsule TAKE 1 CAPSULE (20 MG TOTAL)  BY MOUTH DAILY. 90 capsule 3  . pioglitazone (ACTOS) 15 MG tablet TAKE 1 TABLET (15 MG TOTAL) BY MOUTH DAILY. 90 tablet 3  . potassium chloride SA (KLOR-CON M20) 20 MEQ tablet TAKE 1 TABLET (20 MEQ TOTAL) BY MOUTH 2 (TWO) TIMES DAILY. 180 tablet 3  . simvastatin (ZOCOR) 20 MG tablet Take 1 tablet (20 mg total) by mouth daily. 90 tablet 3  . sitaGLIPtin (JANUVIA) 100 MG tablet TAKE 1 TABLET (100 MG TOTAL) BY MOUTH DAILY. 90 tablet 3  . valsartan-hydrochlorothiazide (DIOVAN-HCT) 160-12.5 MG tablet Take 1 tablet by mouth daily. 90 tablet 3   No current facility-administered medications on file prior to visit.    BP 138/82 mmHg  Temp(Src) 97.9 F (36.6 C) (Oral)  Wt 202 lb (91.627 kg)       Objective:   Physical Exam  Constitutional: She is oriented to person, place, and time. She appears well-developed and well-nourished. No distress.  Cardiovascular: Normal rate, regular rhythm,  normal heart sounds and intact distal pulses.  Exam reveals no gallop and no friction rub.   No murmur heard. Pulmonary/Chest: Effort normal and breath sounds normal. No respiratory distress. She has no wheezes. She has no rales. She exhibits no tenderness.  Musculoskeletal: Normal range of motion. She exhibits edema. She exhibits no tenderness.  Both calfs had a diameter of 34 cm. No redness, warmth, or tenderness.  +1 pitting edema to right shin area, again there was no redness, warmth or pain with palpation.  Slight radiation pain in right hip that traveled down the outside of her right leg.   Neurological: She is alert and oriented to person, place, and time.  Skin: Skin is warm and dry. No rash noted. She is not diaphoretic. No erythema. No pallor.  Psychiatric: She has a normal mood and affect. Her behavior is normal. Judgment and thought content normal.  Nursing note and vitals reviewed.     Assessment & Plan:  1. Right leg pain - Appears to be MSK in nature. Possibly with sciatica. No concern for DVT. Pain may be due to increased edema in right lower leg.  - ketorolac (TORADOL) injection 60 mg; Inject 2 mLs (60 mg total) into the muscle once. - methylPREDNISolone acetate (DEPO-MEDROL) injection 120 mg; Inject 1.5 mLs (120 mg total) into the muscle once. - Keep leg elevated - Low sodium diet  - Drink plenty of water - Follow up if no improvement.  2. Edema of right lower extremity - furosemide (LASIX) 20 MG tablet; Take 1 tablet (20 mg total) by mouth every other day.  Dispense: 5 tablet; Refill: 0   Dorothyann Peng, NP

## 2016-04-02 ENCOUNTER — Ambulatory Visit: Payer: Medicare Other | Admitting: Adult Health

## 2016-04-07 ENCOUNTER — Encounter (HOSPITAL_BASED_OUTPATIENT_CLINIC_OR_DEPARTMENT_OTHER): Payer: Self-pay | Admitting: *Deleted

## 2016-04-07 ENCOUNTER — Emergency Department (HOSPITAL_BASED_OUTPATIENT_CLINIC_OR_DEPARTMENT_OTHER): Payer: Medicare Other

## 2016-04-07 ENCOUNTER — Emergency Department (HOSPITAL_BASED_OUTPATIENT_CLINIC_OR_DEPARTMENT_OTHER)
Admission: EM | Admit: 2016-04-07 | Discharge: 2016-04-07 | Disposition: A | Discharge status: Home / Self Care | Payer: Medicare Other | Attending: Emergency Medicine | Admitting: Emergency Medicine

## 2016-04-07 DIAGNOSIS — I10 Essential (primary) hypertension: Secondary | ICD-10-CM | POA: Diagnosis not present

## 2016-04-07 DIAGNOSIS — M1711 Unilateral primary osteoarthritis, right knee: Secondary | ICD-10-CM | POA: Insufficient documentation

## 2016-04-07 DIAGNOSIS — Z87891 Personal history of nicotine dependence: Secondary | ICD-10-CM | POA: Insufficient documentation

## 2016-04-07 DIAGNOSIS — M79604 Pain in right leg: Secondary | ICD-10-CM

## 2016-04-07 DIAGNOSIS — R52 Pain, unspecified: Secondary | ICD-10-CM

## 2016-04-07 DIAGNOSIS — E119 Type 2 diabetes mellitus without complications: Secondary | ICD-10-CM | POA: Diagnosis not present

## 2016-04-07 DIAGNOSIS — Z794 Long term (current) use of insulin: Secondary | ICD-10-CM | POA: Diagnosis not present

## 2016-04-07 DIAGNOSIS — M25551 Pain in right hip: Secondary | ICD-10-CM | POA: Diagnosis not present

## 2016-04-07 DIAGNOSIS — M25561 Pain in right knee: Secondary | ICD-10-CM | POA: Diagnosis not present

## 2016-04-07 DIAGNOSIS — Z79899 Other long term (current) drug therapy: Secondary | ICD-10-CM | POA: Diagnosis not present

## 2016-04-07 DIAGNOSIS — M79661 Pain in right lower leg: Secondary | ICD-10-CM | POA: Diagnosis not present

## 2016-04-07 MED ORDER — TRAMADOL HCL 50 MG PO TABS
50.0000 mg | ORAL_TABLET | Freq: Once | ORAL | Status: AC
  Administered 2016-04-07: 50 mg via ORAL
  Filled 2016-04-07: qty 1

## 2016-04-07 MED ORDER — TRAMADOL HCL 50 MG PO TABS: 50.0000 mg | ORAL_TABLET | Freq: Four times a day (QID) | ORAL | Status: DC | PRN

## 2016-04-07 NOTE — ED Provider Notes (Signed)
I received pt in signout from Dr. Oleta Mouse. Patient has had 4 weeks of right leg pain. Plain films of knee show osteoarthritis changes. We were awaiting ultrasound to rule out DVT. Ultrasound showed no evidence of DVT. I discussed findings with patient and instructed her to follow-up with PCP regarding her symptoms. She was well-appearing at time of discharge and discharged in satisfactory condition.  Sharlett Iles, MD 04/07/16 [phone]

## 2016-04-07 NOTE — ED Notes (Signed)
Patient transported to Ultrasound 

## 2016-04-07 NOTE — ED Notes (Signed)
Pt reports right leg pain x 4 weeks. Worsening pain with ambulating.  Denies pain with palpation.

## 2016-04-07 NOTE — Discharge Instructions (Signed)
Knee Pain Knee pain is a very common symptom and can have many causes. Knee pain often goes away when you follow your health care provider's instructions for relieving pain and discomfort at home. However, knee pain can develop into a condition that needs treatment. Some conditions may include:  Arthritis caused by wear and tear (osteoarthritis).  Arthritis caused by swelling and irritation (rheumatoid arthritis or gout).  A cyst or growth in your knee.  An infection in your knee joint.  An injury that will not heal.  Damage, swelling, or irritation of the tissues that support your knee (torn ligaments or tendinitis). If your knee pain continues, additional tests may be ordered to diagnose your condition. Tests may include X-rays or other imaging studies of your knee. You may also need to have fluid removed from your knee. Treatment for ongoing knee pain depends on the cause, but treatment may include:  Medicines to relieve pain or swelling.  Steroid injections in your knee.  Physical therapy.  Surgery. HOME CARE INSTRUCTIONS  Take medicines only as directed by your health care provider.  Rest your knee and keep it raised (elevated) while you are resting.  Do not do things that cause or worsen pain.  Avoid high-impact activities or exercises, such as running, jumping rope, or doing jumping jacks.  Apply ice to the knee area:  Put ice in a plastic bag.  Place a towel between your skin and the bag.  Leave the ice on for 20 minutes, 2-3 times a day.  Ask your health care provider if you should wear an elastic knee support.  Keep a pillow under your knee when you sleep.  Lose weight if you are overweight. Extra weight can put pressure on your knee.  Do not use any tobacco products, including cigarettes, chewing tobacco, or electronic cigarettes. If you need help quitting, ask your health care provider. Smoking may slow the healing of any bone and joint problems that you may  have. SEEK MEDICAL CARE IF:  Your knee pain continues, changes, or gets worse.  You have a fever along with knee pain.  Your knee buckles or locks up.  Your knee becomes more swollen. SEEK IMMEDIATE MEDICAL CARE IF:   Your knee joint feels hot to the touch.  You have chest pain or trouble breathing.   This information is not intended to replace advice given to you by your health care provider. Make sure you discuss any questions you have with your health care provider.   Document Released: 08/18/2007 Document Revised: 11/11/2014 Document Reviewed: 06/06/2014 Elsevier Interactive Patient Education 2016 Elsevier Inc.  Osteoarthritis Osteoarthritis is a disease that causes soreness and inflammation of a joint. It occurs when the cartilage at the affected joint wears down. Cartilage acts as a cushion, covering the ends of bones where they meet to form a joint. Osteoarthritis is the most common form of arthritis. It often occurs in older people. The joints affected most often by this condition include those in the:  Ends of the fingers.  Thumbs.  Neck.  Lower back.  Knees.  Hips. CAUSES  Over time, the cartilage that covers the ends of bones begins to wear away. This causes bone to rub on bone, producing pain and stiffness in the affected joints.  RISK FACTORS Certain factors can increase your chances of having osteoarthritis, including:  Older age.  Excessive body weight.  Overuse of joints.  Previous joint injury. SIGNS AND SYMPTOMS   Pain, swelling, and stiffness in  the joint.  Over time, the joint may lose its normal shape.  Small deposits of bone (osteophytes) may grow on the edges of the joint.  Bits of bone or cartilage can break off and float inside the joint space. This may cause more pain and damage. DIAGNOSIS  Your health care provider will do a physical exam and ask about your symptoms. Various tests may be ordered, such as:  X-rays of the affected  joint.  Blood tests to rule out other types of arthritis. Additional tests may be used to diagnose your condition. TREATMENT  Goals of treatment are to control pain and improve joint function. Treatment plans may include:  A prescribed exercise program that allows for rest and joint relief.  A weight control plan.  Pain relief techniques, such as:  Properly applied heat and cold.  Electric pulses delivered to nerve endings under the skin (transcutaneous electrical nerve stimulation [TENS]).  Massage.  Certain nutritional supplements.  Medicines to control pain, such as:  Acetaminophen.  Nonsteroidal anti-inflammatory drugs (NSAIDs), such as naproxen.  Narcotic or central-acting agents, such as tramadol.  Corticosteroids. These can be given orally or as an injection.  Surgery to reposition the bones and relieve pain (osteotomy) or to remove loose pieces of bone and cartilage. Joint replacement may be needed in advanced states of osteoarthritis. HOME CARE INSTRUCTIONS   Take medicines only as directed by your health care provider.  Maintain a healthy weight. Follow your health care provider's instructions for weight control. This may include dietary instructions.  Exercise as directed. Your health care provider can recommend specific types of exercise. These may include:  Strengthening exercises. These are done to strengthen the muscles that support joints affected by arthritis. They can be performed with weights or with exercise bands to add resistance.  Aerobic activities. These are exercises, such as brisk walking or low-impact aerobics, that get your heart pumping.  Range-of-motion activities. These keep your joints limber.  Balance and agility exercises. These help you maintain daily living skills.  Rest your affected joints as directed by your health care provider.  Keep all follow-up visits as directed by your health care provider. SEEK MEDICAL CARE IF:    Your skin turns red.  You develop a rash in addition to your joint pain.  You have worsening joint pain.  You have a fever along with joint or muscle aches. SEEK IMMEDIATE MEDICAL CARE IF:  You have a significant loss of weight or appetite.  You have night sweats. [place] of Arthritis and Musculoskeletal and Skin Diseases: www.niams.SouthExposed.es  [employer] on Aging: http://kim-miller.com/  American College of Rheumatology: www.rheumatology.org   This information is not intended to replace advice given to you by your health care provider. Make sure you discuss any questions you have with your health care provider.   Document Released: 10/21/2005 Document Revised: 11/11/2014 Document Reviewed: 06/28/2013 Elsevier Interactive Patient Education [employer].

## 2016-04-07 NOTE — ED Provider Notes (Signed)
CSN: [id number]     Arrival date & time 04/07/16  1330 History   First MD Initiated Contact with Patient 04/07/16 1449     Chief Complaint  Patient presents with  . Leg Pain     (Consider location/radiation/quality/duration/timing/severity/associated sxs/prior Treatment) HPI 75 year old female who presents with right knee pain. She has a history of diabetes and hypertension. Reports progressive right lower extremity pain ongoing for the past 4 weeks. Primarily localizes her pain to the back of her knee and to the anterior aspect of her knee. Minimal swelling noted. No overlying redness or warmth. Denies any associating trauma, fevers or chills. Primarily notices pain with ambulation. No prior history of DVT or PE, but states that normally she is not is mobile, and instead less mobile over the past 4 weeks due to pain with ambulation. No numbness or weakness. Past Medical History  Diagnosis Date  . Hypertension   . Glaucoma   . Diabetes mellitus   . HSV (herpes simplex virus) infection    Past Surgical History  Procedure Laterality Date  . Abdominal hysterectomy     Family History  Problem Relation Age of Onset  . Diabetes Mother    Social History  Substance Use Topics  . Smoking status: Never Smoker   . Smokeless tobacco: Former Systems developer    Types: Snuff  . Alcohol Use: No   OB History    No data available     Review of Systems 10/14 systems reviewed and are negative other than those stated in the HPI    Allergies  Review of patient's allergies indicates no known allergies.  Home Medications   Prior to Admission medications   Medication Sig Start Date End Date Taking? Authorizing Provider  amLODipine (NORVASC) 5 MG tablet TAKE 1 TABLET (5 MG TOTAL) BY MOUTH DAILY. 08/23/15   Dorothyann Peng, NP  brimonidine (ALPHAGAN P) 0.1 % SOLN Place 1 drop into both eyes 2 (two) times daily. 02/27/16   Dorothyann Peng, NP  carvedilol (COREG) 12.5 MG tablet TAKE 1 TABLET BY MOUTH TWICE A  DAY WITH A MEAL 08/23/15   Dorothyann Peng, NP  dorzolamide (TRUSOPT) 2 % ophthalmic solution PLACE 1 DROP IN BOTH EYES TWICE A DAY 06/07/15   Dorothyann Peng, NP  furosemide (LASIX) 20 MG tablet Take 1 tablet (20 mg total) by mouth every other day. 03/29/16   Dorothyann Peng, NP  glucose blood (TRUETRACK TEST) test strip USE 1 TWO TIMES DAILY AS INSTRUCTED DX/250.02 08/23/15   Dorothyann Peng, NP  insulin glargine (LANTUS) 100 UNIT/ML injection INJECT 75 UNITS INTO THE SKIN AT BEDTIME. 08/23/15   Dorothyann Peng, NP  Insulin Syringe-Needle U-100 (B-D INS SYR ULTRAFINE 1CC/30G) 30G X 1/2" 1 ML MISC INJECT 20 UNITS INTO THE SKIN DAILY. 08/23/15   Dorothyann Peng, NP  LUMIGAN 0.01 % SOLN PLACE 1 DROP INTO BOTH EYES AT BEDTIME. 10/18/15   Marletta Lor, MD  omeprazole (PRILOSEC) 20 MG capsule TAKE 1 CAPSULE (20 MG TOTAL) BY MOUTH DAILY. 08/23/15   Dorothyann Peng, NP  pioglitazone (ACTOS) 15 MG tablet TAKE 1 TABLET (15 MG TOTAL) BY MOUTH DAILY. 08/23/15   Dorothyann Peng, NP  potassium chloride SA (KLOR-CON M20) 20 MEQ tablet TAKE 1 TABLET (20 MEQ TOTAL) BY MOUTH 2 (TWO) TIMES DAILY. 08/23/15   Dorothyann Peng, NP  simvastatin (ZOCOR) 20 MG tablet Take 1 tablet (20 mg total) by mouth daily. 08/23/15   Dorothyann Peng, NP  sitaGLIPtin (JANUVIA) 100 MG tablet TAKE 1 TABLET (  100 MG TOTAL) BY MOUTH DAILY. 08/23/15   Dorothyann Peng, NP  traMADol (ULTRAM) 50 MG tablet Take 1 tablet (50 mg total) by mouth every 6 (six) hours as needed for severe pain. 04/07/16   Sharlett Iles, MD  valsartan-hydrochlorothiazide (DIOVAN-HCT) 160-12.5 MG tablet Take 1 tablet by mouth daily. 08/23/15   Dorothyann Peng, NP   BP 168/76 mmHg  Pulse 68  Temp(Src) 98 F (36.7 C) (Oral)  Resp 18  Ht 5\' 1"  (1.549 m)  Wt 200 lb (90.719 kg)  BMI 37.81 kg/m2  SpO2 100% Physical Exam Physical Exam  Nursing note and vitals reviewed. Constitutional: Well developed, well nourished, non-toxic, and in no acute distress Head: Normocephalic and  atraumatic.  Mouth/Throat: Oropharynx is clear and moist.  Neck: Normal range of motion. Neck supple.  Cardiovascular: Normal rate and regular rhythm.   +2 DP pulses bilaterally. Pulmonary/Chest: Effort normal and breath sounds normal.  Abdominal: Soft. There is no tenderness. There is no rebound and no guarding.  Musculoskeletal: Minimal right knee swelling with full ROM of hip and knee of RLE. No overlying skin changes or warmth.  Neurological: Alert, no facial droop, fluent speech, moves all extremities symmetrically, full strength ankle dorsi and plantar flexion. Sensation to light touch intact throughout bilateral lower extremities. Skin: Skin is warm and dry.  Psychiatric: Cooperative  ED Course  Procedures (including critical care time) Labs Review Labs Reviewed - No data to display  Imaging Review US Venous Img Lower Unilateral Right  04/07/2016  CLINICAL DATA:  Right lower extremity pain EXAM: RIGHT LOWER EXTREMITY VENOUS DUPLEX ULTRASOUND TECHNIQUE: Gray-scale sonography with graded compression, as well as color Doppler and duplex ultrasound were performed to evaluate the right lower extremity deep venous system from the level of the common femoral vein and including the common femoral, femoral, profunda femoral, popliteal and calf veins including the posterior tibial, peroneal and gastrocnemius veins when visible. The superficial great saphenous vein was also interrogated. Spectral Doppler was utilized to evaluate flow at rest and with distal augmentation maneuvers in the common femoral, femoral and popliteal veins. COMPARISON:  None. FINDINGS: Contralateral Common Femoral Vein: Respiratory phasicity is normal and symmetric with the symptomatic side. No evidence of thrombus. Normal compressibility. Common Femoral Vein: No evidence of thrombus. Normal compressibility, respiratory phasicity and response to augmentation. Saphenofemoral Junction: No evidence of thrombus. Normal  compressibility and flow on color Doppler imaging. Profunda Femoral Vein: No evidence of thrombus. Normal compressibility and flow on color Doppler imaging. Femoral Vein: No evidence of thrombus. Normal compressibility, respiratory phasicity and response to augmentation. Popliteal Vein: No evidence of thrombus. Normal compressibility, respiratory phasicity and response to augmentation. Calf Veins: No evidence of thrombus. Normal compressibility and flow on color Doppler imaging. Superficial Great Saphenous Vein: No evidence of thrombus. Normal compressibility and flow on color Doppler imaging. Venous Reflux:  None. Other Findings:  None. IMPRESSION: No evidence of right lower extremity deep venous thrombosis. Left common femoral vein also patent. Electronically Signed   By: Lowella Grip III M.D.   On: 04/07/2016 16:54   Dg Knee Complete 4 Views Right  04/07/2016  CLINICAL DATA:  Right knee pain, primarily medially. No known injury EXAM: RIGHT KNEE - COMPLETE 4+ VIEW COMPARISON:  None. FINDINGS: Frontal, lateral, and bilateral oblique views were obtained. There is no appreciable fracture or dislocation. There is a small joint effusion. There is mild narrowing medially and in the patellofemoral joint regions. There is mild spurring medially. There are foci of arterial  vascular calcification. IMPRESSION: Areas of osteoarthritic change. Small joint effusion. No fracture or dislocation. Electronically Signed   By: Lowella Grip III M.D.   On: 04/07/2016 16:06   Dg Hip Unilat With Pelvis 2-3 Views Right  04/07/2016  CLINICAL DATA:  Right hip pain.  No known injury. EXAM: DG HIP (WITH OR WITHOUT PELVIS) 2-3V RIGHT COMPARISON:  None. FINDINGS: Frontal pelvis as well as frontal and lateral right hip images were obtained. There is no acute fracture or dislocation. There is mild symmetric narrowing of both hip joints. No erosive change. There are foci of vascular calcification bilaterally. IMPRESSION: Mild  symmetric narrowing both hip joints. No fracture or dislocation. No erosive change. Electronically Signed   By: Lowella Grip III M.D.   On: 04/07/2016 16:07   I have personally reviewed and evaluated these images and lab results as part of my medical decision-making.   EKG Interpretation None      MDM   Final diagnoses:  Right knee pain  Primary osteoarthritis of right knee    75 year old female with history of diabetes and hypertension who presents with progressive right knee pain over the past 4 weeks. On presentation, she is afebrile hemodynamically stable. Without systemic signs or symptoms of illness. She is well-appearing and in no acute distress. Mild swelling of the right knee, with normal range of motion. Right lower extremity is neurovascularly intact. With some tenderness behind the right knee as well. Suspect osteoarthritis. We'll obtain x-rays of the right hip and knee and ultrasound to evaluate for DVT in the right lower extremity. If overall suggestive of osteoarthritis we'll discharge with PCP follow-up and ongoing management.    Forde Dandy, MD 04/08/16 [phone]

## 2016-04-10 ENCOUNTER — Ambulatory Visit (INDEPENDENT_AMBULATORY_CARE_PROVIDER_SITE_OTHER): Payer: Medicare Other | Admitting: Adult Health

## 2016-04-10 VITALS — BP 110/72 | HR 66 | Ht 61.0 in | Wt 198.2 lb

## 2016-04-10 DIAGNOSIS — M25561 Pain in right knee: Secondary | ICD-10-CM

## 2016-04-10 MED ORDER — METHYLPREDNISOLONE ACETATE 80 MG/ML IJ SUSP
80.0000 mg | Freq: Once | INTRAMUSCULAR | Status: AC
  Administered 2016-04-10: 80 mg via INTRA_ARTICULAR

## 2016-04-10 NOTE — Patient Instructions (Signed)
It was great seeing you again.   I am sorry you are still in pain.   The steroid shot can take a day or two to fully start working.   Continue to use the Tramadol for pain relief.   Someone from orthopedics will call to schedule your appointment.   Follow up as needed

## 2016-04-10 NOTE — Progress Notes (Signed)
Subjective:    Patient ID: Lynn Swanson, female    DOB: Jun 18, 1941, 75 y.o.   MRN: [id number]  HPI 75 year old female who presents to the office today for continued right knee pain. She was seen in the ER three days ago for this pain and she has had the pain for at least 4 weeks. Prior to going to the ER she was seen by this provider for right hip and leg pain. She was given an IM injection of Toradol 60mg  and Depo-Medrol 120 mg   [place] was negative for DVT in the ER  X ray of the right knee showed:  FINDINGS: Frontal, lateral, and bilateral oblique views were obtained. There is no appreciable fracture or dislocation. There is a small joint effusion. There is mild narrowing medially and in the patellofemoral joint regions. There is mild spurring medially. There are foci of arterial vascular calcification.  IMPRESSION: Areas of osteoarthritic change. Small joint effusion. No fracture or Dislocation.  X ray of the pelvis showed:   FINDINGS: Frontal pelvis as well as frontal and lateral right hip images were obtained. There is no acute fracture or dislocation. There is mild symmetric narrowing of both hip joints. No erosive change. There are foci of vascular calcification bilaterally.  IMPRESSION: Mild symmetric narrowing both hip joints. No fracture or dislocation. No erosive change.  Today in the office she reports that the injections she was given at the last office visit worked for about 3 days and then she started having pain again.   Currently, she is having a harder time ambulating and is having increased pain. She denies any trauma, redness, warmth, or increased swelling. She denies any pain currently in the back of the knee, most of her pain is located in the distal knee  Review of Systems  Constitutional: Positive for activity change.  Respiratory: Negative.   Cardiovascular: Negative.   Musculoskeletal: Positive for myalgias, joint swelling, arthralgias and gait  problem.  Skin: Negative.   Neurological: Negative.   Psychiatric/Behavioral: Negative.   All other systems reviewed and are negative.  Past Medical History  Diagnosis Date  . Hypertension   . Glaucoma   . Diabetes mellitus   . HSV (herpes simplex virus) infection     Social History   Social History  . Marital Status: Widowed    Spouse Name: N/A  . Number of Children: N/A  . Years of Education: N/A   Occupational History  . Not on file.   Social History Main Topics  . Smoking status: Never Smoker   . Smokeless tobacco: Former Systems developer    Types: Snuff  . Alcohol Use: No  . Drug Use: No  . Sexual Activity: Not on file   Other Topics Concern  . Not on file   Social History Narrative   Retired    Married, husband deceased in March 04, 2003 with kidney failure   -Three daughters, one lives in [place], one in [place], one [place]. Three sons, one is [place], one is in [place], one passes away.    Lives with daughter   No pets.    Likes to walk and go to the park.           Past Surgical History  Procedure Laterality Date  . Abdominal hysterectomy      Family History  Problem Relation Age of Onset  . Diabetes Mother     No Known Allergies  Current Outpatient Prescriptions on File Prior to Visit  Medication Sig  Dispense Refill  . amLODipine (NORVASC) 5 MG tablet TAKE 1 TABLET (5 MG TOTAL) BY MOUTH DAILY. 90 tablet 3  . brimonidine (ALPHAGAN P) 0.1 % SOLN Place 1 drop into both eyes 2 (two) times daily. 15 mL 0  . carvedilol (COREG) 12.5 MG tablet TAKE 1 TABLET BY MOUTH TWICE A DAY WITH A MEAL 180 tablet 3  . dorzolamide (TRUSOPT) 2 % ophthalmic solution PLACE 1 DROP IN BOTH EYES TWICE A DAY 10 mL 1  . furosemide (LASIX) 20 MG tablet Take 1 tablet (20 mg total) by mouth every other day. 5 tablet 0  . glucose blood (TRUETRACK TEST) test strip USE 1 TWO TIMES DAILY AS INSTRUCTED DX/250.02 300 each 1  . insulin glargine (LANTUS) 100 UNIT/ML injection INJECT 75 UNITS INTO  THE SKIN AT BEDTIME. 30 mL 11  . Insulin Syringe-Needle U-100 (B-D INS SYR ULTRAFINE 1CC/30G) 30G X 1/2" 1 ML MISC INJECT 20 UNITS INTO THE SKIN DAILY. 100 each 11  . LUMIGAN 0.01 % SOLN PLACE 1 DROP INTO BOTH EYES AT BEDTIME. 7.5 mL 3  . omeprazole (PRILOSEC) 20 MG capsule TAKE 1 CAPSULE (20 MG TOTAL) BY MOUTH DAILY. 90 capsule 3  . pioglitazone (ACTOS) 15 MG tablet TAKE 1 TABLET (15 MG TOTAL) BY MOUTH DAILY. 90 tablet 3  . potassium chloride SA (KLOR-CON M20) 20 MEQ tablet TAKE 1 TABLET (20 MEQ TOTAL) BY MOUTH 2 (TWO) TIMES DAILY. 180 tablet 3  . simvastatin (ZOCOR) 20 MG tablet Take 1 tablet (20 mg total) by mouth daily. 90 tablet 3  . sitaGLIPtin (JANUVIA) 100 MG tablet TAKE 1 TABLET (100 MG TOTAL) BY MOUTH DAILY. 90 tablet 3  . traMADol (ULTRAM) 50 MG tablet Take 1 tablet (50 mg total) by mouth every 6 (six) hours as needed for severe pain. 8 tablet 0  . valsartan-hydrochlorothiazide (DIOVAN-HCT) 160-12.5 MG tablet Take 1 tablet by mouth daily. 90 tablet 3   No current facility-administered medications on file prior to visit.    BP 86/60 mmHg  Pulse 66  Ht 5\' 1"  (1.549 m)  Wt 198 lb 3.2 oz (89.903 kg)  BMI 37.47 kg/m2  SpO2 98%       Objective:   Physical Exam  Constitutional: She is oriented to person, place, and time. She appears well-developed and well-nourished. No distress.  Cardiovascular: Normal rate, regular rhythm, normal heart sounds and intact distal pulses.  Exam reveals no gallop and no friction rub.   No murmur heard. Pulmonary/Chest: Effort normal and breath sounds normal. No respiratory distress. She has no wheezes. She has no rales. She exhibits no tenderness.  Musculoskeletal: Normal range of motion. She exhibits edema and tenderness.       Right knee: She exhibits swelling and effusion. She exhibits no deformity, no erythema and no bony tenderness. Tenderness found. Medial joint line and lateral joint line tenderness noted. No MCL, no LCL and no patellar tendon  tenderness noted.  No crepitus felt  Neurological: She is alert and oriented to person, place, and time. She has normal reflexes. She displays normal reflexes. No cranial nerve deficit. She exhibits normal muscle tone. Coordination normal.  Skin: Skin is warm and dry. No rash noted. She is not diaphoretic. No erythema. No pallor.  Psychiatric: She has a normal mood and affect. Her behavior is normal. Judgment and thought content normal.  Nursing note and vitals reviewed.     Assessment & Plan:  1. Right knee pain   Discussed risks and benefits of  corticosteroid injection and patient consented.  After prepping skin with betadine, injected 80 mg depomedrol and 2 cc of plain xylocaine with 22 gauge one and one half inch needle using anterolateral approach and pt tolerated well. - methylPREDNISolone acetate (DEPO-MEDROL) injection 80 mg; Inject 1 mL (80 mg total) into the articular space once. - AMB referral to orthopedics - Follow up as needed  Dorothyann Peng, NP

## 2016-04-15 DIAGNOSIS — M25561 Pain in right knee: Secondary | ICD-10-CM | POA: Diagnosis not present

## 2016-04-23 DIAGNOSIS — M25561 Pain in right knee: Secondary | ICD-10-CM | POA: Diagnosis not present

## 2016-04-24 ENCOUNTER — Other Ambulatory Visit: Payer: Self-pay

## 2016-04-24 DIAGNOSIS — IMO0001 Reserved for inherently not codable concepts without codable children: Secondary | ICD-10-CM

## 2016-04-24 DIAGNOSIS — E1165 Type 2 diabetes mellitus with hyperglycemia: Principal | ICD-10-CM

## 2016-04-24 DIAGNOSIS — Z794 Long term (current) use of insulin: Principal | ICD-10-CM

## 2016-04-24 DIAGNOSIS — Z760 Encounter for issue of repeat prescription: Secondary | ICD-10-CM

## 2016-04-24 MED ORDER — GLUCOSE BLOOD VI STRP: ORAL_STRIP | Status: DC

## 2016-04-25 DIAGNOSIS — M25561 Pain in right knee: Secondary | ICD-10-CM | POA: Diagnosis not present

## 2016-05-17 DIAGNOSIS — L84 Corns and callosities: Secondary | ICD-10-CM | POA: Diagnosis not present

## 2016-05-17 DIAGNOSIS — L603 Nail dystrophy: Secondary | ICD-10-CM | POA: Diagnosis not present

## 2016-05-17 DIAGNOSIS — I739 Peripheral vascular disease, unspecified: Secondary | ICD-10-CM | POA: Diagnosis not present

## 2016-05-17 DIAGNOSIS — E1151 Type 2 diabetes mellitus with diabetic peripheral angiopathy without gangrene: Secondary | ICD-10-CM | POA: Diagnosis not present

## 2016-05-22 ENCOUNTER — Ambulatory Visit (INDEPENDENT_AMBULATORY_CARE_PROVIDER_SITE_OTHER): Payer: Medicare Other | Admitting: Adult Health

## 2016-05-22 ENCOUNTER — Ambulatory Visit: Payer: Medicare Other | Admitting: Adult Health

## 2016-05-22 ENCOUNTER — Encounter: Payer: Self-pay | Admitting: Adult Health

## 2016-05-22 VITALS — BP 108/70 | Ht 61.0 in | Wt 198.5 lb

## 2016-05-22 DIAGNOSIS — E1165 Type 2 diabetes mellitus with hyperglycemia: Secondary | ICD-10-CM

## 2016-05-22 DIAGNOSIS — Z794 Long term (current) use of insulin: Secondary | ICD-10-CM | POA: Diagnosis not present

## 2016-05-22 DIAGNOSIS — I10 Essential (primary) hypertension: Secondary | ICD-10-CM

## 2016-05-22 DIAGNOSIS — IMO0001 Reserved for inherently not codable concepts without codable children: Secondary | ICD-10-CM

## 2016-05-22 LAB — POCT GLYCOSYLATED HEMOGLOBIN (HGB A1C): Hemoglobin A1C: 5.8

## 2016-05-22 NOTE — Progress Notes (Signed)
Subjective:    Patient ID: Lynn Swanson, female    DOB: Jun 12, 1941, 75 y.o.   MRN: [id number]  HPI  75 year old female who presents to the office today for follow up regarding diabetes and hypertension. Her last A1c was 6.1. She is largely non compliant with her diet but does take her medication as directed.   Today in the office she reports that she is " feeling good"   She has aches and pains in her right knee but the discomfort has much improved since the steroid injection in June.  Overall she has no complaints.   Review of Systems  Constitutional: Negative.   Respiratory: Negative.   Cardiovascular: Negative.   Genitourinary: Negative.   Musculoskeletal: Negative.   Neurological: Negative.   All other systems reviewed and are negative.  Past Medical History  Diagnosis Date  . Hypertension   . Glaucoma   . Diabetes mellitus   . HSV (herpes simplex virus) infection     Social History   Social History  . Marital Status: Widowed    Spouse Name: N/A  . Number of Children: N/A  . Years of Education: N/A   Occupational History  . Not on file.   Social History Main Topics  . Smoking status: Never Smoker   . Smokeless tobacco: Former Systems developer    Types: Snuff  . Alcohol Use: No  . Drug Use: No  . Sexual Activity: Not on file   Other Topics Concern  . Not on file   Social History Narrative   Retired    Married, husband deceased in Feb 18, 2003 with kidney failure   -Three daughters, one lives in [place], one in [place], one [place]. Three sons, one is [place], one is in [place], one passes away.    Lives with daughter   No pets.    Likes to walk and go to the park.           Past Surgical History  Procedure Laterality Date  . Abdominal hysterectomy      Family History  Problem Relation Age of Onset  . Diabetes Mother     No Known Allergies  Current Outpatient Prescriptions on File Prior to Visit  Medication Sig Dispense Refill  . amLODipine (NORVASC)  5 MG tablet TAKE 1 TABLET (5 MG TOTAL) BY MOUTH DAILY. 90 tablet 3  . brimonidine (ALPHAGAN P) 0.1 % SOLN Place 1 drop into both eyes 2 (two) times daily. 15 mL 0  . carvedilol (COREG) 12.5 MG tablet TAKE 1 TABLET BY MOUTH TWICE A DAY WITH A MEAL 180 tablet 3  . dorzolamide (TRUSOPT) 2 % ophthalmic solution PLACE 1 DROP IN BOTH EYES TWICE A DAY 10 mL 1  . furosemide (LASIX) 20 MG tablet Take 1 tablet (20 mg total) by mouth every other day. 5 tablet 0  . glucose blood (TRUETRACK TEST) test strip USE 1 TWO TIMES DAILY AS INSTRUCTED DX/250.02 300 each 1  . insulin glargine (LANTUS) 100 UNIT/ML injection INJECT 75 UNITS INTO THE SKIN AT BEDTIME. 30 mL 11  . Insulin Syringe-Needle U-100 (B-D INS SYR ULTRAFINE 1CC/30G) 30G X 1/2" 1 ML MISC INJECT 20 UNITS INTO THE SKIN DAILY. 100 each 11  . LUMIGAN 0.01 % SOLN PLACE 1 DROP INTO BOTH EYES AT BEDTIME. 7.5 mL 3  . omeprazole (PRILOSEC) 20 MG capsule TAKE 1 CAPSULE (20 MG TOTAL) BY MOUTH DAILY. 90 capsule 3  . pioglitazone (ACTOS) 15 MG tablet TAKE 1 TABLET (15 MG  TOTAL) BY MOUTH DAILY. 90 tablet 3  . potassium chloride SA (KLOR-CON M20) 20 MEQ tablet TAKE 1 TABLET (20 MEQ TOTAL) BY MOUTH 2 (TWO) TIMES DAILY. 180 tablet 3  . simvastatin (ZOCOR) 20 MG tablet Take 1 tablet (20 mg total) by mouth daily. 90 tablet 3  . sitaGLIPtin (JANUVIA) 100 MG tablet TAKE 1 TABLET (100 MG TOTAL) BY MOUTH DAILY. 90 tablet 3  . traMADol (ULTRAM) 50 MG tablet Take 1 tablet (50 mg total) by mouth every 6 (six) hours as needed for severe pain. 8 tablet 0  . valsartan-hydrochlorothiazide (DIOVAN-HCT) 160-12.5 MG tablet Take 1 tablet by mouth daily. 90 tablet 3   No current facility-administered medications on file prior to visit.    BP 108/70 mmHg  Ht 5\' 1"  (1.549 m)  Wt 198 lb 8 oz (90.039 kg)  BMI 37.53 kg/m2      Objective:   Physical Exam  Constitutional: She is oriented to person, place, and time. She appears well-developed and well-nourished. No distress.  HENT:    Head: Normocephalic and atraumatic.  Right Ear: External ear normal.  Left Ear: External ear normal.  Nose: Nose normal.  Mouth/Throat: Oropharynx is clear and moist. No oropharyngeal exudate.  Cardiovascular: Normal rate, regular rhythm, normal heart sounds and intact distal pulses.  Exam reveals no gallop and no friction rub.   No murmur heard. Pulmonary/Chest: Effort normal and breath sounds normal. No respiratory distress. She has no wheezes. She has no rales. She exhibits no tenderness.  Musculoskeletal: Normal range of motion.  Neurological: She is alert and oriented to person, place, and time.  Skin: Skin is warm and dry. No rash noted. She is not diaphoretic. No erythema. No pallor.  Psychiatric: She has a normal mood and affect. Her behavior is normal. Judgment and thought content normal.  Nursing note and vitals reviewed.     Assessment & Plan:  1. Essential hypertension, benign - Well controlled - No change in medications - Follow up in 6 months   2. Uncontrolled type 2 diabetes mellitus without complication, with long-term current use of insulin (HCC) - POC HgB A1c 5.8  - Well controlled, no change at this time - Will consider decreasing medication in the future.  - Follow up in 6 months  Dorothyann Peng, NP

## 2016-06-24 DIAGNOSIS — E113593 Type 2 diabetes mellitus with proliferative diabetic retinopathy without macular edema, bilateral: Secondary | ICD-10-CM | POA: Diagnosis not present

## 2016-07-28 ENCOUNTER — Other Ambulatory Visit: Payer: Self-pay | Admitting: Adult Health

## 2016-07-28 DIAGNOSIS — Z760 Encounter for issue of repeat prescription: Secondary | ICD-10-CM

## 2016-07-28 DIAGNOSIS — IMO0001 Reserved for inherently not codable concepts without codable children: Secondary | ICD-10-CM

## 2016-07-28 DIAGNOSIS — E1165 Type 2 diabetes mellitus with hyperglycemia: Principal | ICD-10-CM

## 2016-07-28 DIAGNOSIS — Z794 Long term (current) use of insulin: Principal | ICD-10-CM

## 2016-08-14 DIAGNOSIS — E1051 Type 1 diabetes mellitus with diabetic peripheral angiopathy without gangrene: Secondary | ICD-10-CM | POA: Diagnosis not present

## 2016-08-14 DIAGNOSIS — L603 Nail dystrophy: Secondary | ICD-10-CM | POA: Diagnosis not present

## 2016-08-14 DIAGNOSIS — I739 Peripheral vascular disease, unspecified: Secondary | ICD-10-CM | POA: Diagnosis not present

## 2016-08-21 ENCOUNTER — Encounter: Payer: Self-pay | Admitting: Adult Health

## 2016-08-21 ENCOUNTER — Other Ambulatory Visit: Payer: Self-pay | Admitting: Adult Health

## 2016-08-21 ENCOUNTER — Telehealth: Payer: Self-pay | Admitting: Adult Health

## 2016-08-21 MED ORDER — VALACYCLOVIR HCL 1 G PO TABS: 1000.0000 mg | ORAL_TABLET | Freq: Two times a day (BID) | ORAL | 4 refills | Status: AC

## 2016-08-21 NOTE — Telephone Encounter (Signed)
Please advise 

## 2016-08-21 NOTE — Telephone Encounter (Signed)
Pt is having a herpes outbreak and needs valtrex . [facility] [address]

## 2016-10-01 ENCOUNTER — Other Ambulatory Visit: Payer: Self-pay | Admitting: Adult Health

## 2016-10-01 DIAGNOSIS — Z760 Encounter for issue of repeat prescription: Secondary | ICD-10-CM

## 2016-10-01 DIAGNOSIS — E1165 Type 2 diabetes mellitus with hyperglycemia: Secondary | ICD-10-CM

## 2016-10-01 DIAGNOSIS — IMO0001 Reserved for inherently not codable concepts without codable children: Secondary | ICD-10-CM

## 2016-10-01 DIAGNOSIS — Z794 Long term (current) use of insulin: Secondary | ICD-10-CM

## 2016-11-13 ENCOUNTER — Telehealth: Payer: Self-pay

## 2016-11-13 NOTE — Telephone Encounter (Signed)
Received fax from insurance company that Lantus is no longer a preferred medication. The preferred are: [employer].  Would you like to switch the medication or do you want to try a prior auth?

## 2016-11-13 NOTE — Telephone Encounter (Signed)
Ok to switch to [employer]. Dose is the same as Lantus

## 2016-11-13 NOTE — Telephone Encounter (Signed)
Please advise 

## 2016-11-14 ENCOUNTER — Other Ambulatory Visit: Payer: Self-pay

## 2016-11-14 MED ORDER — BASAGLAR KWIKPEN 100 UNIT/ML ~~LOC~~ SOPN: PEN_INJECTOR | SUBCUTANEOUS | 3 refills | Status: DC

## 2016-11-14 NOTE — Telephone Encounter (Signed)
Rx for Nancee Liter has been sent in.

## 2016-12-26 ENCOUNTER — Encounter: Payer: Self-pay | Admitting: Adult Health

## 2016-12-26 DIAGNOSIS — Z124 Encounter for screening for malignant neoplasm of cervix: Secondary | ICD-10-CM | POA: Diagnosis not present

## 2016-12-26 DIAGNOSIS — N958 Other specified menopausal and perimenopausal disorders: Secondary | ICD-10-CM | POA: Diagnosis not present

## 2016-12-26 DIAGNOSIS — M8588 Other specified disorders of bone density and structure, other site: Secondary | ICD-10-CM | POA: Diagnosis not present

## 2016-12-26 DIAGNOSIS — Z1231 Encounter for screening mammogram for malignant neoplasm of breast: Secondary | ICD-10-CM | POA: Diagnosis not present

## 2016-12-26 DIAGNOSIS — Z6841 Body Mass Index (BMI) 40.0 and over, adult: Secondary | ICD-10-CM | POA: Diagnosis not present

## 2016-12-27 ENCOUNTER — Encounter: Payer: Self-pay | Admitting: Adult Health

## 2016-12-31 ENCOUNTER — Encounter: Payer: Self-pay | Admitting: Adult Health

## 2017-02-04 ENCOUNTER — Encounter: Payer: Self-pay | Admitting: Adult Health

## 2017-02-04 ENCOUNTER — Ambulatory Visit (INDEPENDENT_AMBULATORY_CARE_PROVIDER_SITE_OTHER): Payer: Medicare Other | Admitting: Adult Health

## 2017-02-04 VITALS — BP 132/74 | Temp 99.3°F | Ht 61.0 in | Wt 195.8 lb

## 2017-02-04 DIAGNOSIS — E7800 Pure hypercholesterolemia, unspecified: Secondary | ICD-10-CM | POA: Diagnosis not present

## 2017-02-04 DIAGNOSIS — E1165 Type 2 diabetes mellitus with hyperglycemia: Secondary | ICD-10-CM | POA: Diagnosis not present

## 2017-02-04 DIAGNOSIS — I10 Essential (primary) hypertension: Secondary | ICD-10-CM | POA: Diagnosis not present

## 2017-02-04 DIAGNOSIS — IMO0001 Reserved for inherently not codable concepts without codable children: Secondary | ICD-10-CM

## 2017-02-04 DIAGNOSIS — Z794 Long term (current) use of insulin: Secondary | ICD-10-CM | POA: Diagnosis not present

## 2017-02-04 LAB — LIPID PANEL
CHOLESTEROL: 179 mg/dL (ref 0–200)
HDL: 39.4 mg/dL (ref >39.00)
LDL CALC: 123 mg/dL — AB (ref 0–99)
NonHDL: 140.03
TRIGLYCERIDES: 85 mg/dL (ref 0.0–149.0)
Total CHOL/HDL Ratio: 5
VLDL: 17 mg/dL (ref 0.0–40.0)

## 2017-02-04 LAB — CBC WITH DIFFERENTIAL/PLATELET
BASOS PCT: 0.2 % (ref 0.0–3.0)
Basophils Absolute: 0 10*3/uL (ref 0.0–0.1)
EOS PCT: 1.6 % (ref 0.0–5.0)
Eosinophils Absolute: 0.1 10*3/uL (ref 0.0–0.7)
HEMATOCRIT: 36.5 % (ref 36.0–46.0)
HEMOGLOBIN: 12 g/dL (ref 12.0–15.0)
Lymphocytes Relative: 32.6 % (ref 12.0–46.0)
Lymphs Abs: 1.8 10*3/uL (ref 0.7–4.0)
MCHC: 32.7 g/dL (ref 30.0–36.0)
MCV: 89.2 fl (ref 78.0–100.0)
MONO ABS: 0.6 10*3/uL (ref 0.1–1.0)
Monocytes Relative: 11.2 % (ref 3.0–12.0)
Neutro Abs: 3.1 10*3/uL (ref 1.4–7.7)
Neutrophils Relative %: 54.4 % (ref 43.0–77.0)
Platelets: 208 10*3/uL (ref 150.0–400.0)
RBC: 4.09 Mil/uL (ref 3.87–5.11)
RDW: 15.3 % (ref 11.5–15.5)
WBC: 5.6 10*3/uL (ref 4.0–10.5)

## 2017-02-04 LAB — HEPATIC FUNCTION PANEL
ALT: 12 U/L (ref 0–35)
AST: 14 U/L (ref 0–37)
Albumin: 3.7 g/dL (ref 3.5–5.2)
Alkaline Phosphatase: 78 U/L (ref 39–117)
BILIRUBIN TOTAL: 0.3 mg/dL (ref 0.2–1.2)
Bilirubin, Direct: 0.1 mg/dL (ref 0.0–0.3)
Total Protein: 6.9 g/dL (ref 6.0–8.3)

## 2017-02-04 LAB — POC URINALSYSI DIPSTICK (AUTOMATED)
Bilirubin, UA: NEGATIVE
Glucose, UA: NEGATIVE
Ketones, UA: NEGATIVE
Leukocytes, UA: NEGATIVE
Nitrite, UA: NEGATIVE
PH UA: 6 (ref 5.0–8.0)
PROTEIN UA: NEGATIVE
RBC UA: NEGATIVE
Spec Grav, UA: 1.02 (ref 1.030–1.035)
UROBILINOGEN UA: 0.2 (ref ?–2.0)

## 2017-02-04 LAB — BASIC METABOLIC PANEL
BUN: 19 mg/dL (ref 6–23)
CALCIUM: 9.1 mg/dL (ref 8.4–10.5)
CO2: 30 mEq/L (ref 19–32)
CREATININE: 0.65 mg/dL (ref 0.40–1.20)
Chloride: 108 mEq/L (ref 96–112)
GFR: 113.73 mL/min (ref >60.00)
Glucose, Bld: 95 mg/dL (ref 70–99)
Potassium: 4 mEq/L (ref 3.5–5.1)
Sodium: 143 mEq/L (ref 135–145)

## 2017-02-04 LAB — HEMOGLOBIN A1C: HEMOGLOBIN A1C: 6.1 % (ref 4.6–6.5)

## 2017-02-04 LAB — TSH: TSH: 1.85 u[IU]/mL (ref 0.35–4.50)

## 2017-02-04 NOTE — Progress Notes (Signed)
Subjective:    Patient ID: Lynn Swanson, female    DOB: 10/20/1941, 76 y.o.   MRN: [id number]  HPI  Patient presents for yearly follow up exam. She is a pleasant 76 year old female who  has a past medical history of Diabetes mellitus; Glaucoma; HSV (herpes simplex virus) infection; and Hypertension.  All immunizations and health maintenance protocols were reviewed with the patient and needed orders were placed. She is up to date on all of her vaccinations.   Appropriate screening laboratory values were ordered for the patient including screening of hyperlipidemia, renal function and hepatic function.  Medication reconciliation,  past medical history, social history, problem list and allergies were reviewed in detail with the patient  Goals were established with regard to weight loss, exercise, and  diet in compliance with medications. She does not follow a diabetic diet and does not exercise on a regular basis   End of life planning was discussed.  Hypertension - Well controlled on medications. She takes Coreg, Norvasc, and Diovan.  - Denies any headaches, blurred vision, or dizziness.   Diabetes - She reports uncontrolled ranges from 100-300. Her last A1c in July 2017 was 5.8. Current regimen includes basilar 75 units at night. Actos 15 mg and Januvia 100mg  - Diet is not well controlled. She does not follow a diabetic diet and eats whatever she wants.   - Does not exercise as much as she wants too. If she does it is walking.   Her last mammogram was in 2011-02-09- she does not want to have one   Review of Systems  Constitutional: Negative.   HENT: Negative.   Eyes: Negative.   Respiratory: Negative.   Cardiovascular: Negative.   Gastrointestinal: Negative.   Endocrine: Negative.   Genitourinary: Negative.   Musculoskeletal: Negative.   Skin: Negative.   Allergic/Immunologic: Negative.   Neurological: Negative.   Hematological: Negative.   Psychiatric/Behavioral: Negative.     All other systems reviewed and are negative.  Past Medical History:  Diagnosis Date  . Diabetes mellitus   . Glaucoma   . HSV (herpes simplex virus) infection   . Hypertension     Social History   Social History  . Marital status: Widowed    Spouse name: N/A  . Number of children: N/A  . Years of education: N/A   Occupational History  . Not on file.   Social History Main Topics  . Smoking status: Never Smoker  . Smokeless tobacco: Former Systems developer    Types: Snuff  . Alcohol use No  . Drug use: No  . Sexual activity: Not on file   Other Topics Concern  . Not on file   Social History Narrative   Retired    Married, husband deceased in 2003-02-09 with kidney failure   -Three daughters, one lives in [place], one in [place], one [place]. Three sons, one is [place], one is in [place], one passes away.    Lives with daughter   No pets.    Likes to walk and go to the park.           Past Surgical History:  Procedure Laterality Date  . ABDOMINAL HYSTERECTOMY      Family History  Problem Relation Age of Onset  . Diabetes Mother     No Known Allergies  Current Outpatient Prescriptions on File Prior to Visit  Medication Sig Dispense Refill  . amLODipine (NORVASC) 5 MG tablet TAKE 1 TABLET (5 MG TOTAL) BY MOUTH DAILY. [place]  tablet 3  . B-D INS SYR ULTRAFINE 1CC/30G 30G X 1/2" 1 ML MISC INJECT 20 UNITS INTO THE SKIN DAILY. 100 each 3  . brimonidine (ALPHAGAN P) 0.1 % SOLN Place 1 drop into both eyes 2 (two) times daily. 15 mL 0  . carvedilol (COREG) 12.5 MG tablet TAKE 1 TABLET BY MOUTH TWICE A DAY WITH A MEAL 180 tablet 3  . dorzolamide (TRUSOPT) 2 % ophthalmic solution PLACE 1 DROP IN BOTH EYES TWICE A DAY 10 mL 1  . furosemide (LASIX) 20 MG tablet Take 1 tablet (20 mg total) by mouth every other day. 5 tablet 0  . Insulin Glargine (BASAGLAR KWIKPEN) 100 UNIT/ML SOPN Inject 75 units into the skin at bedtime 3 mL 3  . JANUVIA 100 MG tablet TAKE 1 TABLET (100 MG TOTAL) BY  MOUTH DAILY. 90 tablet 3  . KLOR-CON M20 20 MEQ tablet TAKE 1 TABLET (20 MEQ TOTAL) BY MOUTH 2 (TWO) TIMES DAILY. 180 tablet 3  . LANTUS 100 UNIT/ML injection INJECT 75 UNITS INTO THE SKIN AT BEDTIME. 30 mL 5  . LUMIGAN 0.01 % SOLN PLACE 1 DROP INTO BOTH EYES AT BEDTIME. 7.5 mL 3  . omeprazole (PRILOSEC) 20 MG capsule TAKE 1 CAPSULE (20 MG TOTAL) BY MOUTH DAILY. 90 capsule 3  . pioglitazone (ACTOS) 15 MG tablet TAKE 1 TABLET (15 MG TOTAL) BY MOUTH DAILY. 90 tablet 3  . simvastatin (ZOCOR) 20 MG tablet TAKE 1 TABLET (20 MG TOTAL) BY MOUTH DAILY. 90 tablet 3  . traMADol (ULTRAM) 50 MG tablet Take 1 tablet (50 mg total) by mouth every 6 (six) hours as needed for severe pain. 8 tablet 0  . TRUETRACK TEST test strip USE TO TEST BLOOD SUGAR TWICE A DAY AS DIRECTED (DX 250.02) 100 each 1  . valsartan-hydrochlorothiazide (DIOVAN-HCT) 160-12.5 MG tablet TAKE 1 TABLET BY MOUTH DAILY. 90 tablet 3   No current facility-administered medications on file prior to visit.     There were no vitals taken for this visit.      Objective:   Physical Exam  Constitutional: She is oriented to person, place, and time. She appears well-developed and well-nourished. No distress.  HENT:  Head: Normocephalic and atraumatic.  Right Ear: External ear normal.  Left Ear: External ear normal.  Nose: Nose normal.  Mouth/Throat: Oropharynx is clear and moist. No oropharyngeal exudate.  Eyes: Conjunctivae and EOM are normal. Pupils are equal, round, and reactive to light. Right eye exhibits no discharge. Left eye exhibits no discharge. No scleral icterus.  Neck: Trachea normal and normal range of motion. Neck supple. No JVD present. No tracheal tenderness present. Carotid bruit is not present. No tracheal deviation present. No thyroid mass and no thyromegaly present.  Cardiovascular: Normal rate, regular rhythm, normal heart sounds and intact distal pulses.  Exam reveals no gallop and no friction rub.   No murmur  heard. Pulmonary/Chest: Effort normal and breath sounds normal. No stridor. No respiratory distress. She has no wheezes. She has no rales. She exhibits no tenderness.  Abdominal: Soft. Normal appearance and bowel sounds are normal. She exhibits no distension and no mass. There is no hepatosplenomegaly, splenomegaly or hepatomegaly. There is no tenderness. There is no rebound, no guarding and no CVA tenderness.  Genitourinary:  Genitourinary Comments: Deferred   Musculoskeletal: Normal range of motion. She exhibits no edema, tenderness or deformity.  Lymphadenopathy:    She has no cervical adenopathy.  Neurological: She is alert and oriented to person, place, and  time. She has normal reflexes. She displays normal reflexes. No cranial nerve deficit. She exhibits normal muscle tone. Coordination normal.  Skin: Skin is warm and dry. No rash noted. She is not diaphoretic. No erythema. No pallor.  Psychiatric: She has a normal mood and affect. Her behavior is normal. Judgment and thought content normal.  Nursing note and vitals reviewed.     Assessment & Plan:  1. Essential hypertension, benign - Well controlled. No change in medications  - Basic metabolic panel - Hemoglobin A1c - Hepatic function panel - Lipid panel - TSH - CBC with Differential/Platelet - POCT Urinalysis Dipstick (Automated)  2. Uncontrolled type 2 diabetes mellitus without complication, with long-term current use of insulin (HCC)  - Basic metabolic panel - Hemoglobin A1c - Hepatic function panel - Lipid panel - TSH - CBC with Differential/Platelet - POCT Urinalysis Dipstick (Automated)  3. Hypercholesteremia  - Basic metabolic panel - Hemoglobin A1c - Hepatic function panel - Lipid panel - TSH - CBC with Differential/Platelet - POCT Urinalysis Dipstick (Automated)   Dorothyann Peng, NP

## 2017-02-04 NOTE — Patient Instructions (Addendum)
It was great seeing you today!  I will follow up about your blood work.   Of course, follow a diabetic diet and participate in frequent exercise  Health Maintenance, Female Adopting a healthy lifestyle and getting preventive care can go a long way to promote health and wellness. Talk with your health care provider about what schedule of regular examinations is right for you. This is a good chance for you to check in with your provider about disease prevention and staying healthy. In between checkups, there are plenty of things you can do on your own. Experts have done a lot of research about which lifestyle changes and preventive measures are most likely to keep you healthy. Ask your health care provider for more information. Weight and diet Eat a healthy diet  Be sure to include plenty of vegetables, fruits, low-fat dairy products, and lean protein.  Do not eat a lot of foods high in solid fats, added sugars, or salt.  Get regular exercise. This is one of the most important things you can do for your health.  Most adults should exercise for at least 150 minutes each week. The exercise should increase your heart rate and make you sweat (moderate-intensity exercise).  Most adults should also do strengthening exercises at least twice a week. This is in addition to the moderate-intensity exercise. Maintain a healthy weight  Body mass index (BMI) is a measurement that can be used to identify possible weight problems. It estimates body fat based on height and weight. Your health care provider can help determine your BMI and help you achieve or maintain a healthy weight.  For females 25 years of age and older:  A BMI below 18.5 is considered underweight.  A BMI of 18.5 to 24.9 is normal.  A BMI of 25 to 29.9 is considered overweight.  A BMI of 30 and above is considered obese. Watch levels of cholesterol and blood lipids  You should start having your blood tested for lipids and  cholesterol at 77 years of age, then have this test every 5 years.  You may need to have your cholesterol levels checked more often if:  Your lipid or cholesterol levels are high.  You are older than 77 years of age.  You are at high risk for heart disease. Cancer screening Lung Cancer  Lung cancer screening is recommended for adults 59-39 years old who are at high risk for lung cancer because of a history of smoking.  A yearly low-dose CT scan of the lungs is recommended for people who:  Currently smoke.  Have quit within the past 15 years.  Have at least a 30-pack-year history of smoking. A pack year is smoking an average of one pack of cigarettes a day for 1 year.  Yearly screening should continue until it has been 15 years since you quit.  Yearly screening should stop if you develop a health problem that would prevent you from having lung cancer treatment. Breast Cancer  Practice breast self-awareness. This means understanding how your breasts normally appear and feel.  It also means doing regular breast self-exams. Let your health care provider know about any changes, no matter how small.  If you are in your 20s or 30s, you should have a clinical breast exam (CBE) by a health care provider every 1-3 years as part of a regular health exam.  If you are 79 or older, have a CBE every year. Also consider having a breast X-ray (mammogram) every year.  If you have a family history of breast cancer, talk to your health care provider about genetic screening.  If you are at high risk for breast cancer, talk to your health care provider about having an MRI and a mammogram every year.  Breast cancer gene (BRCA) assessment is recommended for women who have family members with BRCA-related cancers. BRCA-related cancers include:  Breast.  Ovarian.  Tubal.  Peritoneal cancers.  Results of the assessment will determine the need for genetic counseling and BRCA1 and BRCA2  testing. Cervical Cancer  Your health care provider may recommend that you be screened regularly for cancer of the pelvic organs (ovaries, uterus, and vagina). This screening involves a pelvic examination, including checking for microscopic changes to the surface of your cervix (Pap test). You may be encouraged to have this screening done every 3 years, beginning at age 7.  For women ages 48-65, health care providers may recommend pelvic exams and Pap testing every 3 years, or they may recommend the Pap and pelvic exam, combined with testing for human papilloma virus (HPV), every 5 years. Some types of HPV increase your risk of cervical cancer. Testing for HPV may also be done on women of any age with unclear Pap test results.  Other health care providers may not recommend any screening for nonpregnant women who are considered low risk for pelvic cancer and who do not have symptoms. Ask your health care provider if a screening pelvic exam is right for you.  If you have had past treatment for cervical cancer or a condition that could lead to cancer, you need Pap tests and screening for cancer for at least 20 years after your treatment. If Pap tests have been discontinued, your risk factors (such as having a new sexual partner) need to be reassessed to determine if screening should resume. Some women have medical problems that increase the chance of getting cervical cancer. In these cases, your health care provider may recommend more frequent screening and Pap tests. Colorectal Cancer  This type of cancer can be detected and often prevented.  Routine colorectal cancer screening usually begins at 77 years of age and continues through 77 years of age.  Your health care provider may recommend screening at an earlier age if you have risk factors for colon cancer.  Your health care provider may also recommend using home test kits to check for hidden blood in the stool.  A small camera at the end of a  tube can be used to examine your colon directly (sigmoidoscopy or colonoscopy). This is done to check for the earliest forms of colorectal cancer.  Routine screening usually begins at age 23.  Direct examination of the colon should be repeated every 5-10 years through 77 years of age. However, you may need to be screened more often if early forms of precancerous polyps or small growths are found. Skin Cancer  Check your skin from head to toe regularly.  Tell your health care provider about any new moles or changes in moles, especially if there is a change in a mole's shape or color.  Also tell your health care provider if you have a mole that is larger than the size of a pencil eraser.  Always use sunscreen. Apply sunscreen liberally and repeatedly throughout the day.  Protect yourself by wearing long sleeves, pants, a wide-brimmed hat, and sunglasses whenever you are outside. Heart disease, diabetes, and high blood pressure  High blood pressure causes heart disease and increases the risk  of stroke. High blood pressure is more likely to develop in:  People who have blood pressure in the high end of the normal range (130-139/85-89 mm Hg).  People who are overweight or obese.  People who are African American.  If you are 70-79 years of age, have your blood pressure checked every 3-5 years. If you are 68 years of age or older, have your blood pressure checked every year. You should have your blood pressure measured twice-once when you are at a hospital or clinic, and once when you are not at a hospital or clinic. Record the average of the two measurements. To check your blood pressure when you are not at a hospital or clinic, you can use:  An automated blood pressure machine at a pharmacy.  A home blood pressure monitor.  If you are between 69 years and 66 years old, ask your health care provider if you should take aspirin to prevent strokes.  Have regular diabetes screenings. This  involves taking a blood sample to check your fasting blood sugar level.  If you are at a normal weight and have a low risk for diabetes, have this test once every three years after 77 years of age.  If you are overweight and have a high risk for diabetes, consider being tested at a younger age or more often. Preventing infection Hepatitis B  If you have a higher risk for hepatitis B, you should be screened for this virus. You are considered at high risk for hepatitis B if:  You were born in a country where hepatitis B is common. Ask your health care provider which countries are considered high risk.  Your parents were born in a high-risk country, and you have not been immunized against hepatitis B (hepatitis B vaccine).  You have HIV or AIDS.  You use needles to inject street drugs.  You live with someone who has hepatitis B.  You have had sex with someone who has hepatitis B.  You get hemodialysis treatment.  You take certain medicines for conditions, including cancer, organ transplantation, and autoimmune conditions. Hepatitis C  Blood testing is recommended for:  Everyone born from [birth year] through 1966.  Anyone with known risk factors for hepatitis C. Sexually transmitted infections (STIs)  You should be screened for sexually transmitted infections (STIs) including gonorrhea and chlamydia if:  You are sexually active and are younger than 76 years of age.  You are older than 77 years of age and your health care provider tells you that you are at risk for this type of infection.  Your sexual activity has changed since you were last screened and you are at an increased risk for chlamydia or gonorrhea. Ask your health care provider if you are at risk.  If you do not have HIV, but are at risk, it may be recommended that you take a prescription medicine daily to prevent HIV infection. This is called pre-exposure prophylaxis (PrEP). You are considered at risk if:  You are  sexually active and do not regularly use condoms or know the HIV status of your partner(s).  You take drugs by injection.  You are sexually active with a partner who has HIV. Talk with your health care provider about whether you are at high risk of being infected with HIV. If you choose to begin PrEP, you should first be tested for HIV. You should then be tested every 3 months for as long as you are taking PrEP. Pregnancy  If you are  premenopausal and you may become pregnant, ask your health care provider about preconception counseling.  If you may become pregnant, take 400 to 800 micrograms (mcg) of folic acid every day.  If you want to prevent pregnancy, talk to your health care provider about birth control (contraception). Osteoporosis and menopause  Osteoporosis is a disease in which the bones lose minerals and strength with aging. This can result in serious bone fractures. Your risk for osteoporosis can be identified using a bone density scan.  If you are 45 years of age or older, or if you are at risk for osteoporosis and fractures, ask your health care provider if you should be screened.  Ask your health care provider whether you should take a calcium or vitamin D supplement to lower your risk for osteoporosis.  Menopause may have certain physical symptoms and risks.  Hormone replacement therapy may reduce some of these symptoms and risks. Talk to your health care provider about whether hormone replacement therapy is right for you. Follow these instructions at home:  Schedule regular health, dental, and eye exams.  Stay current with your immunizations.  Do not use any tobacco products including cigarettes, chewing tobacco, or electronic cigarettes.  If you are pregnant, do not drink alcohol.  If you are breastfeeding, limit how much and how often you drink alcohol.  Limit alcohol intake to no more than 1 drink per day for nonpregnant women. One drink equals 12 ounces of  beer, 5 ounces of wine, or 1 ounces of hard liquor.  Do not use street drugs.  Do not share needles.  Ask your health care provider for help if you need support or information about quitting drugs.  Tell your health care provider if you often feel depressed.  Tell your health care provider if you have ever been abused or do not feel safe at home. This information is not intended to replace advice given to you by your health care provider. Make sure you discuss any questions you have with your health care provider. Document Released: 05/06/2011 Document Revised: 03/28/2016 Document Reviewed: 07/25/2015 Elsevier Interactive Patient Education  2017 [employer].

## 2017-02-05 ENCOUNTER — Encounter: Payer: Medicare Other | Admitting: Adult Health

## 2017-02-10 ENCOUNTER — Other Ambulatory Visit: Payer: Self-pay

## 2017-02-10 DIAGNOSIS — Z760 Encounter for issue of repeat prescription: Secondary | ICD-10-CM

## 2017-02-10 DIAGNOSIS — E1165 Type 2 diabetes mellitus with hyperglycemia: Principal | ICD-10-CM

## 2017-02-10 DIAGNOSIS — IMO0001 Reserved for inherently not codable concepts without codable children: Secondary | ICD-10-CM

## 2017-02-10 DIAGNOSIS — Z794 Long term (current) use of insulin: Principal | ICD-10-CM

## 2017-02-10 MED ORDER — INSULIN SYRINGE-NEEDLE U-100 30G X 1/2" 1 ML MISC
3 refills | Status: DC
Start: 2017-02-10 — End: 2018-11-12

## 2017-02-10 MED ORDER — INSULIN GLARGINE 100 UNIT/ML ~~LOC~~ SOLN
SUBCUTANEOUS | 2 refills | Status: DC
Start: 2017-02-10 — End: 2017-08-26

## 2017-02-10 MED ORDER — GLUCOSE BLOOD VI STRP
ORAL_STRIP | 3 refills | Status: DC
Start: 2017-02-10 — End: 2021-02-07

## 2017-02-13 ENCOUNTER — Other Ambulatory Visit: Payer: Self-pay

## 2017-02-13 ENCOUNTER — Telehealth: Payer: Self-pay | Admitting: Adult Health

## 2017-02-13 MED ORDER — BASAGLAR KWIKPEN 100 UNIT/ML ~~LOC~~ SOPN: 75.0000 [IU] | PEN_INJECTOR | Freq: Every day | SUBCUTANEOUS | 11 refills | Status: DC

## 2017-02-13 NOTE — Telephone Encounter (Signed)
New Rx has been sent in

## 2017-02-13 NOTE — Telephone Encounter (Signed)
Pharmacy is calling stating that the Rx for LANTUS need to state substitute this Rx for insurance purpose like the previous one that was sent in.

## 2017-02-19 DIAGNOSIS — E113522 Type 2 diabetes mellitus with proliferative diabetic retinopathy with traction retinal detachment involving the macula, left eye: Secondary | ICD-10-CM | POA: Diagnosis not present

## 2017-02-19 DIAGNOSIS — E119 Type 2 diabetes mellitus without complications: Secondary | ICD-10-CM | POA: Diagnosis not present

## 2017-02-19 DIAGNOSIS — E113511 Type 2 diabetes mellitus with proliferative diabetic retinopathy with macular edema, right eye: Secondary | ICD-10-CM | POA: Diagnosis not present

## 2017-02-19 DIAGNOSIS — H43812 Vitreous degeneration, left eye: Secondary | ICD-10-CM | POA: Diagnosis not present

## 2017-02-27 DIAGNOSIS — L03031 Cellulitis of right toe: Secondary | ICD-10-CM | POA: Diagnosis not present

## 2017-03-13 DIAGNOSIS — E1140 Type 2 diabetes mellitus with diabetic neuropathy, unspecified: Secondary | ICD-10-CM | POA: Diagnosis not present

## 2017-03-13 DIAGNOSIS — E1051 Type 1 diabetes mellitus with diabetic peripheral angiopathy without gangrene: Secondary | ICD-10-CM | POA: Diagnosis not present

## 2017-03-13 DIAGNOSIS — L720 Epidermal cyst: Secondary | ICD-10-CM | POA: Diagnosis not present

## 2017-03-13 DIAGNOSIS — L84 Corns and callosities: Secondary | ICD-10-CM | POA: Diagnosis not present

## 2017-03-13 DIAGNOSIS — L603 Nail dystrophy: Secondary | ICD-10-CM | POA: Diagnosis not present

## 2017-03-13 DIAGNOSIS — I739 Peripheral vascular disease, unspecified: Secondary | ICD-10-CM | POA: Diagnosis not present

## 2017-03-13 DIAGNOSIS — E1151 Type 2 diabetes mellitus with diabetic peripheral angiopathy without gangrene: Secondary | ICD-10-CM | POA: Diagnosis not present

## 2017-03-13 DIAGNOSIS — D485 Neoplasm of uncertain behavior of skin: Secondary | ICD-10-CM | POA: Diagnosis not present

## 2017-03-13 DIAGNOSIS — G9009 Other idiopathic peripheral autonomic neuropathy: Secondary | ICD-10-CM | POA: Diagnosis not present

## 2017-03-13 DIAGNOSIS — L03031 Cellulitis of right toe: Secondary | ICD-10-CM | POA: Diagnosis not present

## 2017-03-27 ENCOUNTER — Encounter: Payer: Self-pay | Admitting: Family Medicine

## 2017-06-18 DIAGNOSIS — L84 Corns and callosities: Secondary | ICD-10-CM | POA: Diagnosis not present

## 2017-06-18 DIAGNOSIS — E1151 Type 2 diabetes mellitus with diabetic peripheral angiopathy without gangrene: Secondary | ICD-10-CM | POA: Diagnosis not present

## 2017-06-18 DIAGNOSIS — I739 Peripheral vascular disease, unspecified: Secondary | ICD-10-CM | POA: Diagnosis not present

## 2017-06-18 DIAGNOSIS — L603 Nail dystrophy: Secondary | ICD-10-CM | POA: Diagnosis not present

## 2017-06-25 ENCOUNTER — Telehealth: Payer: Self-pay | Admitting: Adult Health

## 2017-06-25 MED ORDER — INSULIN PEN NEEDLE 31G X 5 MM MISC: 3 refills | Status: DC

## 2017-06-25 NOTE — Telephone Encounter (Signed)
Pt needs the needles that go with the [facility] pen she has.  The needles that she used to have do not work with the [employer] she has now. Daughter states this is urgent because pt did not get her shot last night.  Daughter would like a call back when done.  [id number] - [place], [place]

## 2017-06-25 NOTE — Telephone Encounter (Signed)
Sent to the pharmacy by e-scribe. 

## 2017-07-25 ENCOUNTER — Encounter: Payer: Self-pay | Admitting: Adult Health

## 2017-08-20 DIAGNOSIS — E113511 Type 2 diabetes mellitus with proliferative diabetic retinopathy with macular edema, right eye: Secondary | ICD-10-CM | POA: Diagnosis not present

## 2017-08-20 DIAGNOSIS — H43812 Vitreous degeneration, left eye: Secondary | ICD-10-CM | POA: Diagnosis not present

## 2017-08-20 DIAGNOSIS — E113522 Type 2 diabetes mellitus with proliferative diabetic retinopathy with traction retinal detachment involving the macula, left eye: Secondary | ICD-10-CM | POA: Diagnosis not present

## 2017-08-26 ENCOUNTER — Encounter (HOSPITAL_BASED_OUTPATIENT_CLINIC_OR_DEPARTMENT_OTHER): Payer: Self-pay | Admitting: Emergency Medicine

## 2017-08-26 ENCOUNTER — Emergency Department (HOSPITAL_BASED_OUTPATIENT_CLINIC_OR_DEPARTMENT_OTHER): Payer: Medicare Other

## 2017-08-26 ENCOUNTER — Inpatient Hospital Stay (HOSPITAL_BASED_OUTPATIENT_CLINIC_OR_DEPARTMENT_OTHER)
Admission: EM | Admit: 2017-08-26 | Discharge: 2017-08-29 | DRG: 872 | Disposition: A | Discharge status: Home Health | Payer: Medicare Other | Attending: Internal Medicine | Admitting: Internal Medicine

## 2017-08-26 DIAGNOSIS — I10 Essential (primary) hypertension: Secondary | ICD-10-CM | POA: Diagnosis present

## 2017-08-26 DIAGNOSIS — J811 Chronic pulmonary edema: Secondary | ICD-10-CM | POA: Diagnosis present

## 2017-08-26 DIAGNOSIS — I878 Other specified disorders of veins: Secondary | ICD-10-CM | POA: Diagnosis present

## 2017-08-26 DIAGNOSIS — I340 Nonrheumatic mitral (valve) insufficiency: Secondary | ICD-10-CM | POA: Diagnosis not present

## 2017-08-26 DIAGNOSIS — K2970 Gastritis, unspecified, without bleeding: Secondary | ICD-10-CM | POA: Diagnosis not present

## 2017-08-26 DIAGNOSIS — N390 Urinary tract infection, site not specified: Secondary | ICD-10-CM

## 2017-08-26 DIAGNOSIS — I471 Supraventricular tachycardia: Secondary | ICD-10-CM | POA: Diagnosis not present

## 2017-08-26 DIAGNOSIS — I472 Ventricular tachycardia: Secondary | ICD-10-CM | POA: Diagnosis present

## 2017-08-26 DIAGNOSIS — I493 Ventricular premature depolarization: Secondary | ICD-10-CM | POA: Diagnosis present

## 2017-08-26 DIAGNOSIS — Z90710 Acquired absence of both cervix and uterus: Secondary | ICD-10-CM | POA: Diagnosis not present

## 2017-08-26 DIAGNOSIS — K5900 Constipation, unspecified: Secondary | ICD-10-CM | POA: Diagnosis present

## 2017-08-26 DIAGNOSIS — E7800 Pure hypercholesterolemia, unspecified: Secondary | ICD-10-CM | POA: Diagnosis present

## 2017-08-26 DIAGNOSIS — I447 Left bundle-branch block, unspecified: Secondary | ICD-10-CM | POA: Diagnosis present

## 2017-08-26 DIAGNOSIS — I248 Other forms of acute ischemic heart disease: Secondary | ICD-10-CM | POA: Diagnosis not present

## 2017-08-26 DIAGNOSIS — N10 Acute tubulo-interstitial nephritis: Secondary | ICD-10-CM | POA: Diagnosis not present

## 2017-08-26 DIAGNOSIS — Z87891 Personal history of nicotine dependence: Secondary | ICD-10-CM | POA: Diagnosis not present

## 2017-08-26 DIAGNOSIS — R109 Unspecified abdominal pain: Secondary | ICD-10-CM | POA: Diagnosis not present

## 2017-08-26 DIAGNOSIS — Z87440 Personal history of urinary (tract) infections: Secondary | ICD-10-CM | POA: Diagnosis not present

## 2017-08-26 DIAGNOSIS — R0602 Shortness of breath: Secondary | ICD-10-CM | POA: Diagnosis not present

## 2017-08-26 DIAGNOSIS — E876 Hypokalemia: Secondary | ICD-10-CM | POA: Diagnosis present

## 2017-08-26 DIAGNOSIS — Z833 Family history of diabetes mellitus: Secondary | ICD-10-CM

## 2017-08-26 DIAGNOSIS — R011 Cardiac murmur, unspecified: Secondary | ICD-10-CM | POA: Diagnosis present

## 2017-08-26 DIAGNOSIS — E669 Obesity, unspecified: Secondary | ICD-10-CM | POA: Diagnosis present

## 2017-08-26 DIAGNOSIS — Z6835 Body mass index (BMI) 35.0-35.9, adult: Secondary | ICD-10-CM

## 2017-08-26 DIAGNOSIS — K219 Gastro-esophageal reflux disease without esophagitis: Secondary | ICD-10-CM | POA: Diagnosis present

## 2017-08-26 DIAGNOSIS — Z79899 Other long term (current) drug therapy: Secondary | ICD-10-CM

## 2017-08-26 DIAGNOSIS — A419 Sepsis, unspecified organism: Principal | ICD-10-CM | POA: Diagnosis present

## 2017-08-26 DIAGNOSIS — N179 Acute kidney failure, unspecified: Secondary | ICD-10-CM | POA: Diagnosis not present

## 2017-08-26 DIAGNOSIS — E118 Type 2 diabetes mellitus with unspecified complications: Secondary | ICD-10-CM | POA: Diagnosis not present

## 2017-08-26 DIAGNOSIS — R112 Nausea with vomiting, unspecified: Secondary | ICD-10-CM | POA: Diagnosis not present

## 2017-08-26 DIAGNOSIS — E785 Hyperlipidemia, unspecified: Secondary | ICD-10-CM | POA: Diagnosis present

## 2017-08-26 DIAGNOSIS — Z794 Long term (current) use of insulin: Secondary | ICD-10-CM | POA: Diagnosis not present

## 2017-08-26 DIAGNOSIS — R748 Abnormal levels of other serum enzymes: Secondary | ICD-10-CM | POA: Diagnosis not present

## 2017-08-26 DIAGNOSIS — B9620 Unspecified Escherichia coli [E. coli] as the cause of diseases classified elsewhere: Secondary | ICD-10-CM | POA: Diagnosis present

## 2017-08-26 DIAGNOSIS — H409 Unspecified glaucoma: Secondary | ICD-10-CM | POA: Diagnosis present

## 2017-08-26 DIAGNOSIS — I2489 Other forms of acute ischemic heart disease: Secondary | ICD-10-CM

## 2017-08-26 DIAGNOSIS — R1110 Vomiting, unspecified: Secondary | ICD-10-CM | POA: Diagnosis not present

## 2017-08-26 DIAGNOSIS — E860 Dehydration: Secondary | ICD-10-CM

## 2017-08-26 DIAGNOSIS — R1030 Lower abdominal pain, unspecified: Secondary | ICD-10-CM | POA: Diagnosis not present

## 2017-08-26 LAB — COMPREHENSIVE METABOLIC PANEL
ALT: 27 U/L (ref 14–54)
ANION GAP: 8 (ref 5–15)
AST: 26 U/L (ref 15–41)
Albumin: 3 g/dL — ABNORMAL LOW (ref 3.5–5.0)
Alkaline Phosphatase: 75 U/L (ref 38–126)
BUN: 27 mg/dL — AB (ref 6–20)
CO2: 25 mmol/L (ref 22–32)
Calcium: 8.5 mg/dL — ABNORMAL LOW (ref 8.9–10.3)
Chloride: 104 mmol/L (ref 101–111)
Creatinine, Ser: 1.53 mg/dL — ABNORMAL HIGH (ref 0.44–1.00)
GFR, EST AFRICAN AMERICAN: 37 mL/min — AB (ref >60)
GFR, EST NON AFRICAN AMERICAN: 32 mL/min — AB (ref >60)
Glucose, Bld: 94 mg/dL (ref 65–99)
POTASSIUM: 3.6 mmol/L (ref 3.5–5.1)
Sodium: 137 mmol/L (ref 135–145)
TOTAL PROTEIN: 6.8 g/dL (ref 6.5–8.1)
Total Bilirubin: 0.7 mg/dL (ref 0.3–1.2)

## 2017-08-26 LAB — URINALYSIS, MICROSCOPIC (REFLEX)

## 2017-08-26 LAB — CBC
HEMATOCRIT: 31.8 % — AB (ref 36.0–46.0)
Hemoglobin: 10.2 g/dL — ABNORMAL LOW (ref 12.0–15.0)
MCH: 29.1 pg (ref 26.0–34.0)
MCHC: 32.1 g/dL (ref 30.0–36.0)
MCV: 90.6 fL (ref 78.0–100.0)
Platelets: 180 10*3/uL (ref 150–400)
RBC: 3.51 MIL/uL — ABNORMAL LOW (ref 3.87–5.11)
RDW: 14.3 % (ref 11.5–15.5)
WBC: 14.3 10*3/uL — AB (ref 4.0–10.5)

## 2017-08-26 LAB — URINALYSIS, ROUTINE W REFLEX MICROSCOPIC
Bilirubin Urine: NEGATIVE
Glucose, UA: NEGATIVE mg/dL
KETONES UR: NEGATIVE mg/dL
NITRITE: NEGATIVE
PH: 6 (ref 5.0–8.0)
PROTEIN: 100 mg/dL — AB
Specific Gravity, Urine: 1.015 (ref 1.005–1.030)

## 2017-08-26 LAB — GLUCOSE, CAPILLARY
GLUCOSE-CAPILLARY: 105 mg/dL — AB (ref 65–99)
GLUCOSE-CAPILLARY: 122 mg/dL — AB (ref 65–99)

## 2017-08-26 LAB — LIPASE, BLOOD: Lipase: 15 U/L (ref 11–51)

## 2017-08-26 LAB — MRSA PCR SCREENING: MRSA BY PCR: NEGATIVE

## 2017-08-26 LAB — TROPONIN I
TROPONIN I: 0.71 ng/mL — AB (ref <0.03)
Troponin I: 0.58 ng/mL (ref <0.03)
Troponin I: 0.49 ng/mL (ref <0.03)

## 2017-08-26 LAB — LACTIC ACID, PLASMA
Lactic Acid, Venous: 0.8 mmol/L (ref 0.5–1.9)
Lactic Acid, Venous: 0.9 mmol/L (ref 0.5–1.9)

## 2017-08-26 LAB — MAGNESIUM: MAGNESIUM: 1.7 mg/dL (ref 1.7–2.4)

## 2017-08-26 LAB — TSH: TSH: 1.336 u[IU]/mL (ref 0.350–4.500)

## 2017-08-26 LAB — BRAIN NATRIURETIC PEPTIDE: B Natriuretic Peptide: 311.4 pg/mL — ABNORMAL HIGH (ref 0.0–100.0)

## 2017-08-26 MED ORDER — SIMVASTATIN 20 MG PO TABS
20.0000 mg | ORAL_TABLET | Freq: Every day | ORAL | Status: DC
  Administered 2017-08-27 – 2017-08-29 (×3): 20 mg via ORAL
  Filled 2017-08-26: qty 2
  Filled 2017-08-26 (×2): qty 1

## 2017-08-26 MED ORDER — ONDANSETRON HCL 4 MG/2ML IJ SOLN
INTRAMUSCULAR | Status: AC
  Filled 2017-08-26: qty 2

## 2017-08-26 MED ORDER — TRAMADOL HCL 50 MG PO TABS
50.0000 mg | ORAL_TABLET | Freq: Four times a day (QID) | ORAL | Status: DC | PRN
  Administered 2017-08-28: 50 mg via ORAL
  Filled 2017-08-26: qty 1

## 2017-08-26 MED ORDER — ACETAMINOPHEN 325 MG PO TABS
650.0000 mg | ORAL_TABLET | Freq: Four times a day (QID) | ORAL | Status: DC | PRN
  Administered 2017-08-27 – 2017-08-29 (×3): 650 mg via ORAL
  Filled 2017-08-26 (×3): qty 2

## 2017-08-26 MED ORDER — ACETAMINOPHEN 325 MG PO TABS
ORAL_TABLET | ORAL | Status: AC
  Filled 2017-08-26: qty 2

## 2017-08-26 MED ORDER — ACETAMINOPHEN 325 MG PO TABS
650.0000 mg | ORAL_TABLET | Freq: Once | ORAL | Status: AC
  Administered 2017-08-26: 650 mg via ORAL

## 2017-08-26 MED ORDER — DEXTROSE 5 % IV SOLN
1.0000 g | INTRAVENOUS | Status: DC
  Administered 2017-08-27 – 2017-08-29 (×3): 1 g via INTRAVENOUS
  Filled 2017-08-26 (×3): qty 10

## 2017-08-26 MED ORDER — INSULIN GLARGINE 100 UNIT/ML ~~LOC~~ SOLN
30.0000 [IU] | Freq: Every day | SUBCUTANEOUS | Status: DC
  Administered 2017-08-26 – 2017-08-28 (×3): 30 [IU] via SUBCUTANEOUS
  Filled 2017-08-26 (×4): qty 0.3

## 2017-08-26 MED ORDER — CARVEDILOL 3.125 MG PO TABS
3.1250 mg | ORAL_TABLET | Freq: Two times a day (BID) | ORAL | Status: DC
  Administered 2017-08-27 – 2017-08-28 (×2): 3.125 mg via ORAL
  Filled 2017-08-26 (×2): qty 1

## 2017-08-26 MED ORDER — ACETAMINOPHEN 325 MG PO TABS: 650.0000 mg | ORAL_TABLET | Freq: Once | ORAL | Status: DC

## 2017-08-26 MED ORDER — INSULIN ASPART 100 UNIT/ML ~~LOC~~ SOLN: 0.0000 [IU] | Freq: Every day | SUBCUTANEOUS | Status: DC

## 2017-08-26 MED ORDER — FENTANYL CITRATE (PF) 100 MCG/2ML IJ SOLN
INTRAMUSCULAR | Status: AC
  Administered 2017-08-26: 25 ug via INTRAVENOUS
  Filled 2017-08-26: qty 2

## 2017-08-26 MED ORDER — FENTANYL CITRATE (PF) 100 MCG/2ML IJ SOLN
25.0000 ug | Freq: Once | INTRAMUSCULAR | Status: AC
  Administered 2017-08-26: 25 ug via INTRAVENOUS

## 2017-08-26 MED ORDER — DORZOLAMIDE HCL 2 % OP SOLN
1.0000 [drp] | Freq: Two times a day (BID) | OPHTHALMIC | Status: DC
  Administered 2017-08-26 – 2017-08-29 (×6): 1 [drp] via OPHTHALMIC
  Filled 2017-08-26: qty 10

## 2017-08-26 MED ORDER — ONDANSETRON HCL 4 MG/2ML IJ SOLN
4.0000 mg | Freq: Four times a day (QID) | INTRAMUSCULAR | Status: DC | PRN
  Administered 2017-08-27: 4 mg via INTRAVENOUS
  Filled 2017-08-26: qty 2

## 2017-08-26 MED ORDER — PANTOPRAZOLE SODIUM 40 MG PO TBEC
40.0000 mg | DELAYED_RELEASE_TABLET | Freq: Every day | ORAL | Status: DC
  Administered 2017-08-26 – 2017-08-29 (×4): 40 mg via ORAL
  Filled 2017-08-26 (×4): qty 1

## 2017-08-26 MED ORDER — DEXTROSE 5 % IV SOLN
1.0000 g | Freq: Once | INTRAVENOUS | Status: AC
  Administered 2017-08-26: 1 g via INTRAVENOUS
  Filled 2017-08-26: qty 10

## 2017-08-26 MED ORDER — ONDANSETRON HCL 4 MG/2ML IJ SOLN
4.0000 mg | Freq: Once | INTRAMUSCULAR | Status: AC
  Administered 2017-08-26: 4 mg via INTRAVENOUS

## 2017-08-26 MED ORDER — LATANOPROST 0.005 % OP SOLN
1.0000 [drp] | Freq: Every day | OPHTHALMIC | Status: DC
  Administered 2017-08-26 – 2017-08-28 (×3): 1 [drp] via OPHTHALMIC
  Filled 2017-08-26: qty 2.5

## 2017-08-26 MED ORDER — ACETAMINOPHEN 650 MG RE SUPP: 650.0000 mg | Freq: Four times a day (QID) | RECTAL | Status: DC | PRN

## 2017-08-26 MED ORDER — ENOXAPARIN SODIUM 30 MG/0.3ML ~~LOC~~ SOLN
30.0000 mg | SUBCUTANEOUS | Status: DC
  Administered 2017-08-26: 30 mg via SUBCUTANEOUS
  Filled 2017-08-26: qty 0.3

## 2017-08-26 MED ORDER — METOPROLOL TARTRATE 5 MG/5ML IV SOLN
2.5000 mg | Freq: Once | INTRAVENOUS | Status: AC
  Administered 2017-08-26: 2.5 mg via INTRAVENOUS
  Filled 2017-08-26: qty 5

## 2017-08-26 MED ORDER — SODIUM CHLORIDE 0.9 % IV BOLUS (SEPSIS)
500.0000 mL | Freq: Once | INTRAVENOUS | Status: AC
  Administered 2017-08-26: 500 mL via INTRAVENOUS

## 2017-08-26 MED ORDER — ONDANSETRON HCL 4 MG PO TABS: 4.0000 mg | ORAL_TABLET | Freq: Four times a day (QID) | ORAL | Status: DC | PRN

## 2017-08-26 MED ORDER — IOPAMIDOL (ISOVUE-300) INJECTION 61%
100.0000 mL | Freq: Once | INTRAVENOUS | Status: AC | PRN
  Administered 2017-08-26: 80 mL via INTRAVENOUS

## 2017-08-26 MED ORDER — INSULIN ASPART 100 UNIT/ML ~~LOC~~ SOLN
0.0000 [IU] | Freq: Three times a day (TID) | SUBCUTANEOUS | Status: DC
  Administered 2017-08-27: 3 [IU] via SUBCUTANEOUS
  Administered 2017-08-27: 2 [IU] via SUBCUTANEOUS
  Administered 2017-08-28: 1 [IU] via SUBCUTANEOUS
  Administered 2017-08-28 (×2): 2 [IU] via SUBCUTANEOUS

## 2017-08-26 NOTE — ED Provider Notes (Signed)
[facility]-ICU/STEPDOWN Provider Note   CSN: [id number] Arrival date & time: 08/26/17  7408     History   Chief Complaint Chief Complaint  Patient presents with  . Emesis    HPI Callan Yontz is a 76 y.o. female.  The history is provided by the patient and a relative. No language interpreter was used.  Emesis     Dawne Casali is a 76 y.o. female who presents to the Emergency Department complaining of vomiting.  She presents via EMS for evaluation of vomiting that started 2 days ago.  She reports to numerous to count episodes of vomiting with associated waxing and waning generalized abdominal pain.  No reports of fevers, diarrhea, constipation, dysuria.  No known sick contacts.  No prior similar symptoms.  Symptoms are moderate, constant, worsening. Past Medical History:  Diagnosis Date  . Diabetes mellitus   . Glaucoma   . HSV (herpes simplex virus) infection   . Hypertension     Patient Active Problem List   Diagnosis Date Noted  . Vomiting 08/26/2017  . Acute pyelonephritis 08/26/2017  . Demand ischemia ([place])   . AKI (acute kidney injury) ([place])   . Type 2 diabetes mellitus with complication, with long-term current use of insulin ([place]) 12/24/2012  . Essential hypertension, benign 12/24/2012  . Obesity 12/24/2012  . Hypercholesteremia 12/24/2012    Past Surgical History:  Procedure Laterality Date  . ABDOMINAL HYSTERECTOMY      OB History    No data available       Home Medications    Prior to Admission medications   Medication Sig Start Date End Date Taking? Authorizing Provider  amLODipine (NORVASC) 5 MG tablet TAKE 1 TABLET (5 MG TOTAL) BY MOUTH DAILY. 10/01/16  Yes Nafziger, Tommi Rumps, NP  brimonidine (ALPHAGAN P) 0.1 % SOLN Place 1 drop into both eyes 2 (two) times daily. 02/27/16  Yes Nafziger, Tommi Rumps, NP  carvedilol (COREG) 12.5 MG tablet TAKE 1 TABLET BY MOUTH TWICE A DAY WITH A MEAL 10/01/16  Yes Nafziger, Tommi Rumps, NP  dorzolamide  (TRUSOPT) 2 % ophthalmic solution PLACE 1 DROP IN BOTH EYES TWICE A DAY 06/07/15  Yes Nafziger, Tommi Rumps, NP  Insulin Glargine (BASAGLAR KWIKPEN) 100 UNIT/ML SOPN Inject 0.75 mLs (75 Units total) into the skin at bedtime. 02/13/17  Yes Nafziger, Tommi Rumps, NP  JANUVIA 100 MG tablet TAKE 1 TABLET (100 MG TOTAL) BY MOUTH DAILY. 10/01/16  Yes Nafziger, Cory, NP  KLOR-CON M20 20 MEQ tablet TAKE 1 TABLET (20 MEQ TOTAL) BY MOUTH 2 (TWO) TIMES DAILY. 10/01/16  Yes Nafziger, Tommi Rumps, NP  LUMIGAN 0.01 % SOLN PLACE 1 DROP INTO BOTH EYES AT BEDTIME. 10/18/15  Yes Marletta Lor, MD  omeprazole (PRILOSEC) 20 MG capsule TAKE 1 CAPSULE (20 MG TOTAL) BY MOUTH DAILY. 10/01/16  Yes Nafziger, Tommi Rumps, NP  pioglitazone (ACTOS) 15 MG tablet TAKE 1 TABLET (15 MG TOTAL) BY MOUTH DAILY. 10/01/16  Yes Nafziger, Tommi Rumps, NP  simvastatin (ZOCOR) 20 MG tablet TAKE 1 TABLET (20 MG TOTAL) BY MOUTH DAILY. 10/01/16  Yes Nafziger, Tommi Rumps, NP  valsartan-hydrochlorothiazide (DIOVAN-HCT) 160-12.5 MG tablet TAKE 1 TABLET BY MOUTH DAILY. 10/01/16  Yes Nafziger, Tommi Rumps, NP  glucose blood (TRUETRACK TEST) test strip USE TO TEST BLOOD SUGAR TWICE A DAY AS DIRECTED (DX 250.02) 02/10/17   Nafziger, Tommi Rumps, NP  Insulin Pen Needle 31G X 5 MM MISC USE TO INJECT BASAGLAR ONCE DAILY 06/25/17   Nafziger, Tommi Rumps, NP  Insulin Syringe-Needle U-100 (B-D INS SYR ULTRAFINE 1CC/30G)  30G X 1/2" 1 ML MISC INJECT 66 UNITS INTO THE SKIN DAILY. 02/10/17   Dorothyann Peng, NP    Family History Family History  Problem Relation Age of Onset  . Diabetes Mother     Social History Social History  Substance Use Topics  . Smoking status: Never Smoker  . Smokeless tobacco: Former Systems developer    Types: Snuff  . Alcohol use No     Allergies   Patient has no known allergies.   Review of Systems Review of Systems  Gastrointestinal: Positive for vomiting.  All other systems reviewed and are negative.    Physical Exam Updated Vital Signs BP (!) 115/45 (BP Location: Right Arm)    Pulse 82   Temp 99 F (37.2 C) (Oral)   Resp 15   Ht 5\' 2"  (1.575 m)   Wt 88.4 kg (194 lb 14.2 oz)   SpO2 99%   BMI 35.65 kg/m   Physical Exam  Constitutional: She is oriented to person, place, and time. She appears well-developed and well-nourished.  HENT:  Head: Normocephalic and atraumatic.  Cardiovascular: Normal rate and regular rhythm.   No murmur heard. Pulmonary/Chest: Effort normal and breath sounds normal. No respiratory distress.  Abdominal: Soft.  Mild upper abdominal distention.  Moderate right upper quadrant tenderness with mild generalized abdominal tenderness.  Musculoskeletal: She exhibits no edema or tenderness.  Neurological: She is alert and oriented to person, place, and time.  Skin: Skin is warm and dry.  Psychiatric: She has a normal mood and affect. Her behavior is normal.  Nursing note and vitals reviewed.    ED Treatments / Results  Labs (all labs ordered are listed, but only abnormal results are displayed) Labs Reviewed  COMPREHENSIVE METABOLIC PANEL - Abnormal; Notable for the following:       Result Value   BUN 27 (*)    Creatinine, Ser 1.53 (*)    Calcium 8.5 (*)    Albumin 3.0 (*)    GFR calc non Af Amer 32 (*)    GFR calc Af Amer 37 (*)    All other components within normal limits  CBC - Abnormal; Notable for the following:    WBC 14.3 (*)    RBC 3.51 (*)    Hemoglobin 10.2 (*)    HCT 31.8 (*)    All other components within normal limits  URINALYSIS, ROUTINE W REFLEX MICROSCOPIC - Abnormal; Notable for the following:    APPearance CLOUDY (*)    Hgb urine dipstick MODERATE (*)    Protein, ur 100 (*)    Leukocytes, UA LARGE (*)    All other components within normal limits  URINALYSIS, MICROSCOPIC (REFLEX) - Abnormal; Notable for the following:    Bacteria, UA MANY (*)    Squamous Epithelial / LPF 0-5 (*)    All other components within normal limits  TROPONIN I - Abnormal; Notable for the following:    Troponin I 0.58 (*)    All  other components within normal limits  TROPONIN I - Abnormal; Notable for the following:    Troponin I 0.71 (*)    All other components within normal limits  BRAIN NATRIURETIC PEPTIDE - Abnormal; Notable for the following:    B Natriuretic Peptide 311.4 (*)    All other components within normal limits  GLUCOSE, CAPILLARY - Abnormal; Notable for the following:    Glucose-Capillary 105 (*)    All other components within normal limits  MRSA PCR SCREENING  URINE CULTURE  CULTURE, BLOOD (ROUTINE X 2)  CULTURE, BLOOD (ROUTINE X 2)  LIPASE, BLOOD  TSH  LACTIC ACID, PLASMA  LACTIC ACID, PLASMA  MAGNESIUM  TROPONIN I  TROPONIN I  HEMOGLOBIN O8C  BASIC METABOLIC PANEL  CBC    EKG  EKG Interpretation  Date/Time:  Tuesday August 26 2017 08:26:37 EDT Ventricular Rate:  74 PR Interval:    QRS Duration: 107 QT Interval:  399 QTC Calculation: 443 R Axis:   62 Text Interpretation:  Sinus rhythm Ventricular premature complex Incomplete left bundle branch block no prior available for comparison Confirmed by Quintella Reichert [phone]) on 08/26/2017 8:30:05 AM Also confirmed by Quintella Reichert [phone]), editor Philomena Doheny [phone])  on 08/26/2017 9:18:06 AM       Radiology Ct Abdomen Pelvis W Contrast  Result Date: 08/26/2017 CLINICAL DATA:  Abdominal pain.  Nausea and vomiting for 2 days EXAM: CT ABDOMEN AND PELVIS WITH CONTRAST TECHNIQUE: Multidetector CT imaging of the abdomen and pelvis was performed using the standard protocol following bolus administration of intravenous contrast. CONTRAST:  27mL ISOVUE-300 IOPAMIDOL (ISOVUE-300) INJECTION 61% COMPARISON:  None FINDINGS: Lower chest: Lung bases are clear. Hepatobiliary: No focal hepatic lesion. No biliary duct dilatation. Gallbladder is normal. Common bile duct is normal. Pancreas: Pancreas is normal. No ductal dilatation. No pancreatic inflammation. Spleen: Normal spleen Adrenals/urinary tract: Adrenal glands are normal. There is mild  perinephric stranding bilateral. There is subtle enhancement of the epithelium of the LEFT and RIGHT renal pelves (image 36, series 2 for example). Ureters and bladder normal. Stomach/Bowel: Stomach, small-bowel, cecum normal. Appendix not identified. Colon rectosigmoid colon are normal. Vascular/Lymphatic: Abdominal aorta is normal caliber with atherosclerotic calcification. There is no retroperitoneal or periportal lymphadenopathy. No pelvic lymphadenopathy. Reproductive: Post hysterectomy anatomy Other: No free fluid. Musculoskeletal: No aggressive osseous lesion. IMPRESSION: 1. Enhancement of the epithelium of the LEFT and RIGHT renal pelves. Recommend clinical correlation for urinary tract infections. 2. No additional evidence of infection in the abdomen pelvis. 3.  Aortic Atherosclerosis (ICD10-I70.0). Electronically Signed   By: Suzy Bouchard M.D.   On: 08/26/2017 10:07   Dg Chest Port 1 View  Result Date: 08/26/2017 CLINICAL DATA:  Short of breath EXAM: PORTABLE CHEST 1 VIEW COMPARISON:  None. FINDINGS: The stable large cardiac silhouette. There is central venous pulmonary congestion. Mild peripheral interstitial edema. Vascular shadow projecting over the medial aspect of the RIGHT upper lobe. Airways normal. No pneumothorax. IMPRESSION: Central venous congestion and mild interstitial edema. Electronically Signed   By: Suzy Bouchard M.D.   On: 08/26/2017 11:13    Procedures Procedures (including critical care time) CRITICAL CARE Performed by: Quintella Reichert   Total critical care time: 35 minutes  Critical care time was exclusive of separately billable procedures and treating other patients.  Critical care was necessary to treat or prevent imminent or life-threatening deterioration.  Critical care was time spent personally by me on the following activities: development of treatment plan with patient and/or surrogate as well as nursing, discussions with consultants, evaluation of  patient's response to treatment, examination of patient, obtaining history from patient or surrogate, ordering and performing treatments and interventions, ordering and review of laboratory studies, ordering and review of radiographic studies, pulse oximetry and re-evaluation of patient's condition.  Medications Ordered in ED Medications  pantoprazole (PROTONIX) EC tablet 40 mg (40 mg Oral Given 08/26/17 1704)  simvastatin (ZOCOR) tablet 20 mg (0 mg Oral Hold 08/26/17 1649)  carvedilol (COREG) tablet 3.125 mg (0 mg Oral Hold 08/26/17 1700)  traMADol (ULTRAM) tablet 50 mg (not administered)  dorzolamide (TRUSOPT) 2 % ophthalmic solution 1 drop (0 drops Both Eyes Hold 08/26/17 1648)  latanoprost (XALATAN) 0.005 % ophthalmic solution 1 drop (not administered)  enoxaparin (LOVENOX) injection 30 mg (not administered)  acetaminophen (TYLENOL) tablet 650 mg (not administered)    Or  acetaminophen (TYLENOL) suppository 650 mg (not administered)  ondansetron (ZOFRAN) tablet 4 mg (not administered)    Or  ondansetron (ZOFRAN) injection 4 mg (not administered)  cefTRIAXone (ROCEPHIN) 1 g in dextrose 5 % 50 mL IVPB (not administered)  insulin glargine (LANTUS) injection 30 Units (not administered)  insulin aspart (novoLOG) injection 0-9 Units (0 Units Subcutaneous Not Given 08/26/17 1700)  insulin aspart (novoLOG) injection 0-5 Units (not administered)  sodium chloride 0.9 % bolus 500 mL (0 mLs Intravenous Stopped 08/26/17 1031)  cefTRIAXone (ROCEPHIN) 1 g in dextrose 5 % 50 mL IVPB (0 g Intravenous Stopped 08/26/17 1006)  iopamidol (ISOVUE-300) 61 % injection 100 mL (80 mLs Intravenous Contrast Given 08/26/17 0937)  ondansetron (ZOFRAN) injection 4 mg (4 mg Intravenous Given 08/26/17 1034)  fentaNYL (SUBLIMAZE) injection 25 mcg (25 mcg Intravenous Given 08/26/17 1055)  metoprolol tartrate (LOPRESSOR) injection 2.5 mg (2.5 mg Intravenous Given 08/26/17 1108)  acetaminophen (TYLENOL) tablet 650 mg (650  mg Oral Given 08/26/17 1225)     Initial Impression / Assessment and Plan / ED Course  I have reviewed the triage vital signs and the nursing notes.  Pertinent labs & imaging results that were available during my care of the patient were reviewed by me and considered in my medical decision making (see chart for details).     Pt here with two days of vomiting.  UA is c/w UTI - treated with IVF, IV abx.  BMP c/w dehydration and mild AKI.  CT abd obtained given abdominal pain and vomiting - concerning for UTI.  Pt without chest pain or sob, EKG with nonspecific changes, troponin elevated at 0.5.  During ED stay she developed restlessness, recurrent vomiting and was given 4 mg of IV zofran.  A few minutes after medication administration she had a run of nonsustained vtach.  Following the episode she had transient hypoxia to the mid 80s as well as sinus tachycardia 110s-120s.  D/w Dr. Harrington Challenger with Cardiology - recommends medicine admission with Cardiology consult, [facility]le is appropriate.  Discussed case with Hospitalist at [facility] who accepted the patient in transfer.  Patient and daughter updated of findings of studies and are in agreement with treatment plan.  Pt did develop fever at time of transfer to [facility] - treated with tylenol.    Final Clinical Impressions(s) / ED Diagnoses   Final diagnoses:  None    New Prescriptions Current Discharge Medication List       Quintella Reichert, MD 08/26/17 1737

## 2017-08-26 NOTE — ED Notes (Signed)
Date and time results received: 08/26/17 0959 (use smartphrase ".now" to insert current time)  Test: trp Critical Value: 0.58  Name of Provider Notified: Ralene Bathe  Orders Received? Or Actions Taken?: no new order given

## 2017-08-26 NOTE — ED Notes (Signed)
[place], pt daughter.

## 2017-08-26 NOTE — ED Notes (Addendum)
Pt appears anxious, trying to get out of bed. Pt c/o increasing lower abd pain. Pt repositioned in bed and explained to pt the need to stay in bed. Pt bed lowered with side rails up. Daughter at bedside.

## 2017-08-26 NOTE — ED Notes (Signed)
Pt with 20+ beat run of Vtach. EDP made aware.

## 2017-08-26 NOTE — Progress Notes (Signed)
CRITICAL VALUE ALERT  Critical Value:  Trop 0.71  Date & Time Notied:  08/26/17 1624  Provider Notified: Carolin Sicks  Orders Received/Actions taken: MD notified. No new orders at this time. Will continue to monitor pt closely.

## 2017-08-26 NOTE — H&P (Signed)
History and Physical    Lynn Swanson ZOX:[id number] DOB: 02-29-1941 DOA: 08/26/2017  PCP: Dorothyann Peng, NP  Patient coming from:Home  [place]. Vomiting, abdomen pain, back pain and dysuria for 2 days  HPI: Lynn Swanson is a 76 y.o. female with medical history significant of type 2 diabetes on insulin, hypertension, abdominal hysterectomy presented with nausea vomiting, generalized abdominal pain, dysuria for 2 days. Patient was initially presented to [facility] clinic and transfer to [facility] for admission. Patient is poor historian. As per patient, sister and feeling crampy intermittent abdominal pain 2 days ago associated with nausea, dysuria and back pain. The pain was intermittent and could not characterize. Denied fever but reported chills at home. Reported intermittent headache. No chest pain or shortness of breath. She had UTI in the past. She lives with her daughter. Denied smoking or drinking. She is able to ambulate without any help. Patient's daughter was at bedside in the stepdown. The patient was hungry and asking for food. ED Course: In the ER patient was found to have elevated troponin of 0.58, cardiology was consulted and recommended for admission. Treated with ceftriaxone for urinary tract infection. Chest x-ray with vascular congestion.  Review of Systems: As per HPI otherwise 10 point review of systems negative.    Past Medical History:  Diagnosis Date  . Diabetes mellitus   . Glaucoma   . HSV (herpes simplex virus) infection   . Hypertension     Past Surgical History:  Procedure Laterality Date  . ABDOMINAL HYSTERECTOMY      Social History: reports that she has never smoked. She has quit using smokeless tobacco. Her smokeless tobacco use included Snuff. She reports that she does not drink alcohol or use drugs.  No Known Allergies no known drug allergies.  Family History  Problem Relation Age of Onset  . Diabetes Mother      Prior to Admission  medications   Medication Sig Start Date End Date Taking? Authorizing Provider  amLODipine (NORVASC) 5 MG tablet TAKE 1 TABLET (5 MG TOTAL) BY MOUTH DAILY. 10/01/16   Nafziger, Tommi Rumps, NP  brimonidine (ALPHAGAN P) 0.1 % SOLN Place 1 drop into both eyes 2 (two) times daily. 02/27/16   Nafziger, Tommi Rumps, NP  carvedilol (COREG) 12.5 MG tablet TAKE 1 TABLET BY MOUTH TWICE A DAY WITH A MEAL 10/01/16   Nafziger, Tommi Rumps, NP  dorzolamide (TRUSOPT) 2 % ophthalmic solution PLACE 1 DROP IN BOTH EYES TWICE A DAY 06/07/15   Nafziger, Tommi Rumps, NP  furosemide (LASIX) 20 MG tablet Take 1 tablet (20 mg total) by mouth every other day. 03/29/16   Nafziger, Tommi Rumps, NP  glucose blood (TRUETRACK TEST) test strip USE TO TEST BLOOD SUGAR TWICE A DAY AS DIRECTED (DX 250.02) 02/10/17   Nafziger, Tommi Rumps, NP  Insulin Glargine (BASAGLAR KWIKPEN) 100 UNIT/ML SOPN Inject 0.75 mLs (75 Units total) into the skin at bedtime. 02/13/17   Nafziger, Tommi Rumps, NP  insulin glargine (LANTUS) 100 UNIT/ML injection INJECT 75 UNITS INTO THE SKIN AT BEDTIME. 02/10/17   Nafziger, Tommi Rumps, NP  Insulin Pen Needle 31G X 5 MM MISC USE TO INJECT BASAGLAR ONCE DAILY 06/25/17   Nafziger, Tommi Rumps, NP  Insulin Syringe-Needle U-100 (B-D INS SYR ULTRAFINE 1CC/30G) 30G X 1/2" 1 ML MISC INJECT 75 UNITS INTO THE SKIN DAILY. 02/10/17   Nafziger, Tommi Rumps, NP  JANUVIA 100 MG tablet TAKE 1 TABLET (100 MG TOTAL) BY MOUTH DAILY. 10/01/16   Nafziger, Tommi Rumps, NP  KLOR-CON M20 20 MEQ tablet TAKE 1 TABLET (20  MEQ TOTAL) BY MOUTH 2 (TWO) TIMES DAILY. 10/01/16   Nafziger, Tommi Rumps, NP  LUMIGAN 0.01 % SOLN PLACE 1 DROP INTO BOTH EYES AT BEDTIME. 10/18/15   Marletta Lor, MD  omeprazole (PRILOSEC) 20 MG capsule TAKE 1 CAPSULE (20 MG TOTAL) BY MOUTH DAILY. 10/01/16   Nafziger, Tommi Rumps, NP  pioglitazone (ACTOS) 15 MG tablet TAKE 1 TABLET (15 MG TOTAL) BY MOUTH DAILY. 10/01/16   Nafziger, Tommi Rumps, NP  simvastatin (ZOCOR) 20 MG tablet TAKE 1 TABLET (20 MG TOTAL) BY MOUTH DAILY. 10/01/16   Nafziger, Tommi Rumps, NP  traMADol  (ULTRAM) 50 MG tablet Take 1 tablet (50 mg total) by mouth every 6 (six) hours as needed for severe pain. 04/07/16   Little, Wenda Overland, MD  valsartan-hydrochlorothiazide (DIOVAN-HCT) 160-12.5 MG tablet TAKE 1 TABLET BY MOUTH DAILY. 10/01/16   Dorothyann Peng, NP    Physical Exam: Vitals:   08/26/17 1100 08/26/17 1130 08/26/17 1200 08/26/17 1221  BP: (!) 153/74 (!) 115/45 (!) 103/46   Pulse: (!) 103 86 90   Resp: (!) 24 18 17    Temp:    (!) 102.5 F (39.2 C)  TempSrc:      SpO2: 100% 97% 100%   Weight:          Constitutional: NAD, calm, comfortable Vitals:   08/26/17 1100 08/26/17 1130 08/26/17 1200 08/26/17 1221  BP: (!) 153/74 (!) 115/45 (!) 103/46   Pulse: (!) 103 86 90   Resp: (!) 24 18 17    Temp:    (!) 102.5 F (39.2 C)  TempSrc:      SpO2: 100% 97% 100%   Weight:       Eyes: PERRL, lids and conjunctivae normal ENMT: Mucous membranes are moist. Normal dentition.  Neck: normal, supple Respiratory: Bibasal crackles, no wheezing Cardiovascular: Regular rate and rhythm, S1S2 Normal. no murmurs / rubs / gallops. No extremity edema.   Abdomen: Soft, Non-tender, non-distended. Bowel sounds positive.  Musculoskeletal: no clubbing / cyanosis. No joint deformity upper and lower extremities.  Skin: no rashes, lesions, ulcers. No induration Neurologic:  Sensation intact.  Strength 5/5 in all 4.  Psychiatric: Normal judgment and insight. Normal mood.    Labs on Admission: I have personally reviewed following labs and imaging studies  CBC:  Recent Labs Lab 08/26/17 0803  WBC 14.3*  HGB 10.2*  HCT 31.8*  MCV 90.6  PLT 778   Basic Metabolic Panel:  Recent Labs Lab 08/26/17 0803  NA 137  K 3.6  CL 104  CO2 25  GLUCOSE 94  BUN 27*  CREATININE 1.53*  CALCIUM 8.5*   GFR: CrCl cannot be calculated (Unknown ideal weight.). Liver Function Tests:  Recent Labs Lab 08/26/17 0803  AST 26  ALT 27  ALKPHOS 75  BILITOT 0.7  PROT 6.8  ALBUMIN 3.0*     Recent Labs Lab 08/26/17 0803  LIPASE 15   No results for input(s): AMMONIA in the last 168 hours. Coagulation Profile: No results for input(s): INR, PROTIME in the last 168 hours. Cardiac Enzymes:  Recent Labs Lab 08/26/17 0803  TROPONINI 0.58*   BNP (last 3 results) No results for input(s): PROBNP in the last 8760 hours. HbA1C: No results for input(s): HGBA1C in the last 72 hours. CBG: No results for input(s): GLUCAP in the last 168 hours. Lipid Profile: No results for input(s): CHOL, HDL, LDLCALC, TRIG, CHOLHDL, LDLDIRECT in the last 72 hours. Thyroid Function Tests: No results for input(s): TSH, T4TOTAL, FREET4, T3FREE, THYROIDAB in the last  72 hours. Anemia Panel: No results for input(s): VITAMINB12, FOLATE, FERRITIN, TIBC, IRON, RETICCTPCT in the last 72 hours. Urine analysis:    Component Value Date/Time   COLORURINE YELLOW 08/26/2017 0751   APPEARANCEUR CLOUDY (A) 08/26/2017 0751   LABSPEC 1.015 08/26/2017 0751   PHURINE 6.0 08/26/2017 0751   GLUCOSEU NEGATIVE 08/26/2017 0751   HGBUR MODERATE (A) 08/26/2017 0751   BILIRUBINUR NEGATIVE 08/26/2017 0751   BILIRUBINUR N 02/04/2017 0922   KETONESUR NEGATIVE 08/26/2017 0751   PROTEINUR 100 (A) 08/26/2017 0751   UROBILINOGEN 0.2 02/04/2017 0922   UROBILINOGEN 1.0 04/25/2015 1900   NITRITE NEGATIVE 08/26/2017 0751   LEUKOCYTESUR LARGE (A) 08/26/2017 0751   Sepsis Labs: !!!!!!!!!!!!!!!!!!!!!!!!!!!!!!!!!!!!!!!!!!!! @LABRCNTIP (procalcitonin:4,lacticidven:4) )No results found for this or any previous visit (from the past 240 hour(s)).   Radiological Exams on Admission: Ct Abdomen Pelvis W Contrast  Result Date: 08/26/2017 CLINICAL DATA:  Abdominal pain.  Nausea and vomiting for 2 days EXAM: CT ABDOMEN AND PELVIS WITH CONTRAST TECHNIQUE: Multidetector CT imaging of the abdomen and pelvis was performed using the standard protocol following bolus administration of intravenous contrast. CONTRAST:  3mL ISOVUE-300  IOPAMIDOL (ISOVUE-300) INJECTION 61% COMPARISON:  None FINDINGS: Lower chest: Lung bases are clear. Hepatobiliary: No focal hepatic lesion. No biliary duct dilatation. Gallbladder is normal. Common bile duct is normal. Pancreas: Pancreas is normal. No ductal dilatation. No pancreatic inflammation. Spleen: Normal spleen Adrenals/urinary tract: Adrenal glands are normal. There is mild perinephric stranding bilateral. There is subtle enhancement of the epithelium of the LEFT and RIGHT renal pelves (image 36, series 2 for example). Ureters and bladder normal. Stomach/Bowel: Stomach, small-bowel, cecum normal. Appendix not identified. Colon rectosigmoid colon are normal. Vascular/Lymphatic: Abdominal aorta is normal caliber with atherosclerotic calcification. There is no retroperitoneal or periportal lymphadenopathy. No pelvic lymphadenopathy. Reproductive: Post hysterectomy anatomy Other: No free fluid. Musculoskeletal: No aggressive osseous lesion. IMPRESSION: 1. Enhancement of the epithelium of the LEFT and RIGHT renal pelves. Recommend clinical correlation for urinary tract infections. 2. No additional evidence of infection in the abdomen pelvis. 3.  Aortic Atherosclerosis (ICD10-I70.0). Electronically Signed   By: Suzy Bouchard M.D.   On: 08/26/2017 10:07   Dg Chest Port 1 View  Result Date: 08/26/2017 CLINICAL DATA:  Short of breath EXAM: PORTABLE CHEST 1 VIEW COMPARISON:  None. FINDINGS: The stable large cardiac silhouette. There is central venous pulmonary congestion. Mild peripheral interstitial edema. Vascular shadow projecting over the medial aspect of the RIGHT upper lobe. Airways normal. No pneumothorax. IMPRESSION: Central venous congestion and mild interstitial edema. Electronically Signed   By: Suzy Bouchard M.D.   On: 08/26/2017 11:13    EKG: Independently reviewed. Sinus tachycardia and diffuse nonspecific ST depression.  Assessment/Plan Active Problems:   Vomiting   Acute  pyelonephritis  # Non-intractable nausea, vomiting associated with abdominal pain, dysuria, back pain and abnormal CT scan finding concerning for possible acute pyelonephritis: -Urine culture was sent from ER and treated with IV cefazolin. -Patient with 102.5 temperature with leukocytosis concerning for sepsis. I will order lactic acid level, 2 sets of blood culture. The blood culture may be negative because patient already received antibiotics. -Continue supportive care, Zofran, Tylenol  #Elevated troponin concerning for demand ischemia in the setting up possible sepsis and reduced GFR: -Patient denied chest pain or shortness of breath. She has EKG finding of nonspecific ST depression and a chest x-ray consistent with central venous congestion and mild pulmonary edema. -I have discussed above finding with Dr. Harrington Challenger from cardiology for the consult.  No IV heparin and treat  as demand ischemia, per Dr. Harrington Challenger. -No IV fluid, unable to order diuretics because of borderline low blood pressure and sepsis. Check BNP -Echocardiogram ordered -Serial troponin  #Nonsustained V. tach: Check magnesium level, echo and cardiology evaluation ongoing. Resume low-dose Coreg. Telemetry monitoring in the stepdown unit.  #Type 2 diabetes on chronic insulin: Continue Lantus and sliding scale. I resume lower dose of Lantus because patient with decreased oral intake and on clear liquid diet. His A1c level. Monitor blood sugar level.  # History of hypertension: Patient with borderline low blood pressure likely because of infection. Holding antihypertensive medication. Continue monitor.  # Acute kidney injury likely due to acute pyelonephritis: Monitor BMP. Treat infection. I'll avoid nephrotoxins. Holding diuretics and Diovan.  DVT prophylaxis: Lovenox subcutaneous, lower dose Code Status: Full code Family Communication: Patient's daughter at bedside Disposition Plan: Stepdown Consults called: Cardiologist Admission  status: Inpatient   Dron Tanna Furry MD [facility] Pager [phone]  If 7PM-7AM, please contact night-coverage [email or web address] Password [email or web address]  08/26/2017, 2:15 PM

## 2017-08-26 NOTE — Progress Notes (Signed)
76 year old who was admitted with left abdominal discomfort and vomiting. She was diagnosed with UTI and was started on treatment. She had nonspecific EKG changes and troponin was elevated at 0.58. During his stay had nonsustained V. tach. Discussed with cardiology who accepted the patient here.

## 2017-08-26 NOTE — ED Triage Notes (Signed)
Per EMS, pt reports abd pain and N/V x 2 days, none today. Daughter reported pt was difficult to arouse this morning. Pt CAO x 4 at this time.

## 2017-08-27 ENCOUNTER — Inpatient Hospital Stay (HOSPITAL_COMMUNITY): Payer: Medicare Other

## 2017-08-27 DIAGNOSIS — E7800 Pure hypercholesterolemia, unspecified: Secondary | ICD-10-CM

## 2017-08-27 DIAGNOSIS — I340 Nonrheumatic mitral (valve) insufficiency: Secondary | ICD-10-CM

## 2017-08-27 DIAGNOSIS — I10 Essential (primary) hypertension: Secondary | ICD-10-CM

## 2017-08-27 LAB — ECHOCARDIOGRAM COMPLETE
Height: 62 in
Weight: 3118.19 oz

## 2017-08-27 LAB — BASIC METABOLIC PANEL
Anion gap: 10 (ref 5–15)
BUN: 26 mg/dL — ABNORMAL HIGH (ref 6–20)
CHLORIDE: 104 mmol/L (ref 101–111)
CO2: 23 mmol/L (ref 22–32)
CREATININE: 1.24 mg/dL — AB (ref 0.44–1.00)
Calcium: 8.5 mg/dL — ABNORMAL LOW (ref 8.9–10.3)
GFR calc non Af Amer: 41 mL/min — ABNORMAL LOW (ref >60)
GFR, EST AFRICAN AMERICAN: 47 mL/min — AB (ref >60)
Glucose, Bld: 118 mg/dL — ABNORMAL HIGH (ref 65–99)
POTASSIUM: 3.2 mmol/L — AB (ref 3.5–5.1)
SODIUM: 137 mmol/L (ref 135–145)

## 2017-08-27 LAB — CBC
HEMATOCRIT: 30.9 % — AB (ref 36.0–46.0)
Hemoglobin: 10.1 g/dL — ABNORMAL LOW (ref 12.0–15.0)
MCH: 29.2 pg (ref 26.0–34.0)
MCHC: 32.7 g/dL (ref 30.0–36.0)
MCV: 89.3 fL (ref 78.0–100.0)
Platelets: 177 10*3/uL (ref 150–400)
RBC: 3.46 MIL/uL — AB (ref 3.87–5.11)
RDW: 14.9 % (ref 11.5–15.5)
WBC: 15 10*3/uL — AB (ref 4.0–10.5)

## 2017-08-27 LAB — MAGNESIUM: Magnesium: 1.8 mg/dL (ref 1.7–2.4)

## 2017-08-27 LAB — ALBUMIN: ALBUMIN: 2.7 g/dL — AB (ref 3.5–5.0)

## 2017-08-27 LAB — GLUCOSE, CAPILLARY
GLUCOSE-CAPILLARY: 170 mg/dL — AB (ref 65–99)
GLUCOSE-CAPILLARY: 221 mg/dL — AB (ref 65–99)
GLUCOSE-CAPILLARY: 200 mg/dL — AB (ref 65–99)
Glucose-Capillary: 83 mg/dL (ref 65–99)

## 2017-08-27 LAB — HEMOGLOBIN A1C
Hgb A1c MFr Bld: 5.8 % — ABNORMAL HIGH (ref 4.8–5.6)
MEAN PLASMA GLUCOSE: 120 mg/dL

## 2017-08-27 LAB — TROPONIN I: Troponin I: 0.42 ng/mL (ref <0.03)

## 2017-08-27 MED ORDER — POTASSIUM CHLORIDE CRYS ER 20 MEQ PO TBCR
20.0000 meq | EXTENDED_RELEASE_TABLET | Freq: Two times a day (BID) | ORAL | Status: DC
  Administered 2017-08-27 – 2017-08-29 (×4): 20 meq via ORAL
  Filled 2017-08-27 (×4): qty 1

## 2017-08-27 MED ORDER — POTASSIUM CHLORIDE CRYS ER 20 MEQ PO TBCR
40.0000 meq | EXTENDED_RELEASE_TABLET | Freq: Once | ORAL | Status: AC
  Administered 2017-08-27: 40 meq via ORAL
  Filled 2017-08-27: qty 2

## 2017-08-27 MED ORDER — ENOXAPARIN SODIUM 40 MG/0.4ML ~~LOC~~ SOLN
40.0000 mg | SUBCUTANEOUS | Status: DC
  Administered 2017-08-27 – 2017-08-28 (×2): 40 mg via SUBCUTANEOUS
  Filled 2017-08-27 (×2): qty 0.4

## 2017-08-27 NOTE — Progress Notes (Addendum)
PROGRESS NOTE    Lynn Swanson  HYI:[id number] DOB: May 23, 1941 DOA: 08/26/2017 PCP: Dorothyann Peng, NP   Brief Narrative:  Lynn Swanson is Lynn Swanson 76 y.o. female with medical history significant of type 2 diabetes on insulin, hypertension, abdominal hysterectomy presented with nausea vomiting, generalized abdominal pain, dysuria for 2 days.  She was found to have imaging findings c/w pyelonephritis as well as elevated troponin felt consistent with demand ischemia.    Assessment & Plan:   Active Problems:   Vomiting   Acute pyelonephritis   Demand ischemia (HCC)   AKI (acute kidney injury) ([place])   Sepsis 2/2 Urinary tract infection: CT findings with evidence of pyelo (enhancement of epithelium of L and R renal pelves) and UA with many bacteria, WBC's, and + LE.  Fever this AM.    [ ]  urine culture pending [ ]  bcx pending (after abx) Ceftriaxone APAP, tramadol, zofran prn Hemodynamically stable, will transfer to tele bed.  Elevated troponin:  Likely demand ischemia.  Discussed with Dr. Harrington Challenger by admitting provider. Peaked at 0.71, now downtrending.  EKG with nonspecific ST depression, CXR with mild edema, central venous congestion.   She denied any CP or SOB. ProBNP elevated to 311.4 Echo pending [ ]  Cards c/s   Nonsustained Vtach: telemetry with sinus, PVC's overnight.  Continue coreg at lower dose Echo pending  #Type 2 diabetes on chronic insulin: Continue Lantus and sliding scale (lantus at lower dose 30 units instead of 75). I resume lower dose of Lantus because patient with decreased oral intake and on clear liquid diet. Hemoglobin A1c 5.8. Monitor blood sugar level.  # History of hypertension: Patient with borderline low blood pressure likely because of infection. Holding amlodipine, valsartan-HCTZ.  Carvedilol at decreased dose as noted above. Continue monitor.  # Acute kidney injury likely due to acute pyelonephritis: Improving, holding valsartan/HCTZ as above.  Continue to  monitor.  # Hypokalemia: replete prn  # HLD: statin # Gerd: protonix # Glaucoma: latanoprost, dorzolamide  DVT prophylaxis: lovenox Code Status: full  Family Communication: none in room Disposition Plan: pending improvement   Consultants:   Cardiology  Procedures: (Don't include imaging studies which can be auto populated. Include things that cannot be auto populated i.e. Echo, Carotid and venous dopplers, Foley, Bipap, HD, tubes/drains, wound vac, central lines etc)  Echo with normal LV systolic function, mild LVH, mild diastolic dysfunction, mild MR, mod RAE, mild TR with mildly elevated pulmonary pressure  Antimicrobials: (specify start and planned stop date. Auto populated tables are space occupying and do not give end dates)  ceftriaxone 10/23 -    Subjective: Feeling better, feels like she may be coming around.  Denies abdominal pain, nausea.    Objective: Vitals:   08/27/17 0330 08/27/17 0400 08/27/17 0500 08/27/17 0600  BP:  (!) 129/49 (!) 142/48 94/65  Pulse:  93 95 95  Resp:  18 17 16   Temp: (!) 100.8 F (38.2 C) (!) 100.8 F (38.2 C)    TempSrc: Oral Oral    SpO2:  98% 98% 100%  Weight:      Height:        Intake/Output Summary (Last 24 hours) at 08/27/17 0746 Last data filed at 08/26/17 2000  Gross per 24 hour  Intake              550 ml  Output              250 ml  Net  300 ml   Filed Weights   08/26/17 0743 08/26/17 1500  Weight: 88.5 kg (195 lb) 88.4 kg (194 lb 14.2 oz)    Examination:  General exam: Appears calm and comfortable  Respiratory system: Clear to auscultation. Respiratory effort normal. Cardiovascular system: S1 & S2 heard, RRR. 2/6 systolic murmur.  No rubs, gallops or clicks. No pedal edema. Gastrointestinal system: Abdomen is nondistended, soft and nontender. No organomegaly or masses felt. Normal bowel sounds heard.  No CVA tenderness. Central nervous system: Alert and oriented. No focal neurological  deficits. Extremities: Moves extremities equally.  Skin: No rashes, lesions or ulcers Psychiatry: Judgement and insight appear normal. Mood & affect appropriate.     Data Reviewed: I have personally reviewed following labs and imaging studies  CBC:  Recent Labs Lab 08/26/17 0803 08/27/17 0232  WBC 14.3* 15.0*  HGB 10.2* 10.1*  HCT 31.8* 30.9*  MCV 90.6 89.3  PLT 180 151   Basic Metabolic Panel:  Recent Labs Lab 08/26/17 0803 08/26/17 1455 08/27/17 0232  NA 137  --  137  K 3.6  --  3.2*  CL 104  --  104  CO2 25  --  23  GLUCOSE 94  --  118*  BUN 27*  --  26*  CREATININE 1.53*  --  1.24*  CALCIUM 8.5*  --  8.5*  MG  --  1.7  --    GFR: Estimated Creatinine Clearance: 39.2 mL/min ([name]) (by C-G formula based on SCr of 1.24 mg/dL (H)). Liver Function Tests:  Recent Labs Lab 08/26/17 0803  AST 26  ALT 27  ALKPHOS 75  BILITOT 0.7  PROT 6.8  ALBUMIN 3.0*    Recent Labs Lab 08/26/17 0803  LIPASE 15   No results for input(s): AMMONIA in the last 168 hours. Coagulation Profile: No results for input(s): INR, PROTIME in the last 168 hours. Cardiac Enzymes:  Recent Labs Lab 08/26/17 0803 08/26/17 1455 08/26/17 1940 08/27/17 0232  TROPONINI 0.58* 0.71* 0.49* 0.42*   BNP (last 3 results) No results for input(s): PROBNP in the last 8760 hours. HbA1C:  Recent Labs  08/26/17 1455  HGBA1C 5.8*   CBG:  Recent Labs Lab 08/26/17 1621 08/26/17 2114 08/27/17 0719  GLUCAP 105* 122* 83   Lipid Profile: No results for input(s): CHOL, HDL, LDLCALC, TRIG, CHOLHDL, LDLDIRECT in the last 72 hours. Thyroid Function Tests:  Recent Labs  08/26/17 1455  TSH 1.336   Anemia Panel: No results for input(s): VITAMINB12, FOLATE, FERRITIN, TIBC, IRON, RETICCTPCT in the last 72 hours. Sepsis Labs:  Recent Labs Lab 08/26/17 1502 08/26/17 1645  LATICACIDVEN 0.8 0.9    Recent Results (from the past 240 hour(s))  MRSA PCR Screening     Status: None    Collection Time: 08/26/17  1:18 PM  Result Value Ref Range Status   MRSA by PCR NEGATIVE NEGATIVE Final    Comment:        The GeneXpert MRSA Assay (FDA approved for NASAL specimens only), is one component of [name] comprehensive MRSA colonization surveillance program. It is not intended to diagnose MRSA infection nor to guide or monitor treatment for MRSA infections.          Radiology Studies: Ct Abdomen Pelvis W Contrast  Result Date: 08/26/2017 CLINICAL DATA:  Abdominal pain.  Nausea and vomiting for 2 days EXAM: CT ABDOMEN AND PELVIS WITH CONTRAST TECHNIQUE: Multidetector CT imaging of the abdomen and pelvis was performed using the standard protocol following bolus administration of  intravenous contrast. CONTRAST:  45mL ISOVUE-300 IOPAMIDOL (ISOVUE-300) INJECTION 61% COMPARISON:  None FINDINGS: Lower chest: Lung bases are clear. Hepatobiliary: No focal hepatic lesion. No biliary duct dilatation. Gallbladder is normal. Common bile duct is normal. Pancreas: Pancreas is normal. No ductal dilatation. No pancreatic inflammation. Spleen: Normal spleen Adrenals/urinary tract: Adrenal glands are normal. There is mild perinephric stranding bilateral. There is subtle enhancement of the epithelium of the LEFT and RIGHT renal pelves (image 36, series 2 for example). Ureters and bladder normal. Stomach/Bowel: Stomach, small-bowel, cecum normal. Appendix not identified. Colon rectosigmoid colon are normal. Vascular/Lymphatic: Abdominal aorta is normal caliber with atherosclerotic calcification. There is no retroperitoneal or periportal lymphadenopathy. No pelvic lymphadenopathy. Reproductive: Post hysterectomy anatomy Other: No free fluid. Musculoskeletal: No aggressive osseous lesion. IMPRESSION: 1. Enhancement of the epithelium of the LEFT and RIGHT renal pelves. Recommend clinical correlation for urinary tract infections. 2. No additional evidence of infection in the abdomen pelvis. 3.  Aortic  Atherosclerosis (ICD10-I70.0). Electronically Signed   By: Suzy Bouchard M.D.   On: 08/26/2017 10:07   Dg Chest Port 1 View  Result Date: 08/26/2017 CLINICAL DATA:  Short of breath EXAM: PORTABLE CHEST 1 VIEW COMPARISON:  None. FINDINGS: The stable large cardiac silhouette. There is central venous pulmonary congestion. Mild peripheral interstitial edema. Vascular shadow projecting over the medial aspect of the RIGHT upper lobe. Airways normal. No pneumothorax. IMPRESSION: Central venous congestion and mild interstitial edema. Electronically Signed   By: Suzy Bouchard M.D.   On: 08/26/2017 11:13        Scheduled Meds: . carvedilol  3.125 mg Oral BID WC  . dorzolamide  1 drop Both Eyes BID  . enoxaparin (LOVENOX) injection  30 mg Subcutaneous Q24H  . insulin aspart  0-5 Units Subcutaneous QHS  . insulin aspart  0-9 Units Subcutaneous TID WC  . insulin glargine  30 Units Subcutaneous QHS  . latanoprost  1 drop Both Eyes QHS  . pantoprazole  40 mg Oral Daily  . simvastatin  20 mg Oral Daily   Continuous Infusions: . cefTRIAXone (ROCEPHIN)  IV       LOS: 1 day    Time spent: over 30 minutes    Fayrene Helper, MD [facility] Pager [phone]  If 7PM-7AM, please contact night-coverage [email or web address] Password [facility] 08/27/2017, 7:46 AM

## 2017-08-27 NOTE — Care Management Note (Signed)
Case Management Note  Patient Details  Name: Lynn Swanson MRN: [id number] Date of Birth: May 07, 1941  Subjective/Objective:                  uti and elevated troponin  Action/Plan: Date:  August 27 2017 Chart reviewed for concurrent status and case management needs.  Will continue to follow patient progress.  Discharge Planning: following for needs  Expected discharge date: August 30, 2017  Velva Harman, BSN, [place], [place]   Expected Discharge Date:  08/28/17               Expected Discharge Plan:  Home/Self Care  In-House Referral:     Discharge planning Services  CM Consult  Post Acute Care Choice:    Choice offered to:     DME Arranged:    DME Agency:     HH Arranged:    HH Agency:     Status of Service:  In process, will continue to follow  If discussed at Long Length of Stay Meetings, dates discussed:    Additional Comments:  Leeroy Cha, RN 08/27/2017, 9:32 AM

## 2017-08-27 NOTE — Consult Note (Signed)
Cardiology Consultation:   Patient ID: Lynn Swanson; [id number]; 1940/12/16   Admit date: 08/26/2017 Date of Consult: 08/27/2017  Primary Care Provider: Dorothyann Peng, NP Primary Cardiologist: New- Dr. Oval Linsey   Patient Profile:   Lynn Swanson is a 76 y.o. female with a hx of DM type 2, HTN, and abdominal hysterectomy who is being seen today for the evaluation of elevated troponin at the request of Dr. Carolin Sicks.  History of Present Illness:   Lynn Swanson presented to [facility] clinic yesterday for evaluation of nausea, vomiting, generalized abdominal pain and dysuria for 2 days. She was then transferred to [facility] for continued evaluation. In the ED the patient was found to have an elevated troponin of 0.58 with follow up values of 0.71, 0.49 and 0.42. BNP was 311.4. CXR showed central venous congestion and mild interstitial edema. The patient was admitted for possible acute pyelonephritis.   On my exam the patient is resting comfortably in bed, eating lunch. She denies chest pain or shortness of breath. She says that on Sunday evening she developed nausea, vomiting, abdominal pain and urinary frequency. She had no chest pain. Her breathing "was a little funny" but she is not able to describe better than that. It is better now. She lives with her daughter and several other family members. She is not a very good historian but it seems that she is not very active at home. She has had no exertional chest discomfort or shortness of breath. She denies orthopnea, PND, edema, palpitations, lightheadedness, syncope or falls. She does not smoke or drink alcohol. She denies any previous cardiac illness or testing. She denies any know family history of heart disease.   Past Medical History:  Diagnosis Date  . Diabetes mellitus   . Glaucoma   . HSV (herpes simplex virus) infection   . Hypertension     Past Surgical History:  Procedure Laterality Date  . ABDOMINAL HYSTERECTOMY       Home  Medications:  Prior to Admission medications   Medication Sig Start Date End Date Taking? Authorizing Provider  amLODipine (NORVASC) 5 MG tablet TAKE 1 TABLET (5 MG TOTAL) BY MOUTH DAILY. 10/01/16  Yes Nafziger, Tommi Rumps, NP  brimonidine (ALPHAGAN P) 0.1 % SOLN Place 1 drop into both eyes 2 (two) times daily. 02/27/16  Yes Nafziger, Tommi Rumps, NP  carvedilol (COREG) 12.5 MG tablet TAKE 1 TABLET BY MOUTH TWICE A DAY WITH A MEAL 10/01/16  Yes Nafziger, Tommi Rumps, NP  dorzolamide (TRUSOPT) 2 % ophthalmic solution PLACE 1 DROP IN BOTH EYES TWICE A DAY 06/07/15  Yes Nafziger, Tommi Rumps, NP  Insulin Glargine (BASAGLAR KWIKPEN) 100 UNIT/ML SOPN Inject 0.75 mLs (75 Units total) into the skin at bedtime. 02/13/17  Yes Nafziger, Tommi Rumps, NP  JANUVIA 100 MG tablet TAKE 1 TABLET (100 MG TOTAL) BY MOUTH DAILY. 10/01/16  Yes Nafziger, Cory, NP  KLOR-CON M20 20 MEQ tablet TAKE 1 TABLET (20 MEQ TOTAL) BY MOUTH 2 (TWO) TIMES DAILY. 10/01/16  Yes Nafziger, Tommi Rumps, NP  LUMIGAN 0.01 % SOLN PLACE 1 DROP INTO BOTH EYES AT BEDTIME. 10/18/15  Yes Marletta Lor, MD  omeprazole (PRILOSEC) 20 MG capsule TAKE 1 CAPSULE (20 MG TOTAL) BY MOUTH DAILY. 10/01/16  Yes Nafziger, Tommi Rumps, NP  pioglitazone (ACTOS) 15 MG tablet TAKE 1 TABLET (15 MG TOTAL) BY MOUTH DAILY. 10/01/16  Yes Nafziger, Tommi Rumps, NP  simvastatin (ZOCOR) 20 MG tablet TAKE 1 TABLET (20 MG TOTAL) BY MOUTH DAILY. 10/01/16  Yes Nafziger, Tommi Rumps, NP  valsartan-hydrochlorothiazide (DIOVAN-HCT)  160-12.5 MG tablet TAKE 1 TABLET BY MOUTH DAILY. 10/01/16  Yes Nafziger, Tommi Rumps, NP  glucose blood (TRUETRACK TEST) test strip USE TO TEST BLOOD SUGAR TWICE A DAY AS DIRECTED (DX 250.02) 02/10/17   Nafziger, Tommi Rumps, NP  Insulin Pen Needle 31G X 5 MM MISC USE TO INJECT BASAGLAR ONCE DAILY 06/25/17   Nafziger, Tommi Rumps, NP  Insulin Syringe-Needle U-100 (B-D INS SYR ULTRAFINE 1CC/30G) 30G X 1/2" 1 ML MISC INJECT 75 UNITS INTO THE SKIN DAILY. 02/10/17   Dorothyann Peng, NP    Inpatient Medications: Scheduled Meds: .  carvedilol  3.125 mg Oral BID WC  . dorzolamide  1 drop Both Eyes BID  . enoxaparin (LOVENOX) injection  40 mg Subcutaneous Q24H  . insulin aspart  0-5 Units Subcutaneous QHS  . insulin aspart  0-9 Units Subcutaneous TID WC  . insulin glargine  30 Units Subcutaneous QHS  . latanoprost  1 drop Both Eyes QHS  . pantoprazole  40 mg Oral Daily  . simvastatin  20 mg Oral Daily   Continuous Infusions: . cefTRIAXone (ROCEPHIN)  IV Stopped (08/27/17 0958)   PRN Meds: acetaminophen **OR** acetaminophen, ondansetron **OR** ondansetron (ZOFRAN) IV, traMADol  Allergies:   No Known Allergies  Social History:   Social History   Social History  . Marital status: Widowed    Spouse name: N/A  . Number of children: N/A  . Years of education: N/A   Occupational History  . Not on file.   Social History Main Topics  . Smoking status: Never Smoker  . Smokeless tobacco: Former Systems developer    Types: Snuff  . Alcohol use No  . Drug use: No  . Sexual activity: Not on file   Other Topics Concern  . Not on file   Social History Narrative   Retired    Married, husband deceased in Feb 26, 2003 with kidney failure   -Three daughters, one lives in [place], one in [place], one [place]. Three sons, one is [place], one is in [place], one passes away.    Lives with daughter   No pets.    Likes to walk and go to the park.           Family History:    Family History  Problem Relation Age of Onset  . Diabetes Mother      ROS:  Please see the history of present illness.  ROS  All other ROS reviewed and negative.     Physical Exam/Data:   Vitals:   08/27/17 0500 08/27/17 0600 08/27/17 0802 08/27/17 1200  BP: (!) 142/48 94/65    Pulse: 95 95    Resp: 17 16    Temp:   99.9 F (37.7 C) 100.2 F (37.9 C)  TempSrc:   Oral Oral  SpO2: 98% 100%    Weight:      Height:        Intake/Output Summary (Last 24 hours) at 08/27/17 1252 Last data filed at 08/26/17 2000  Gross per 24 hour  Intake                 0 ml  Output              250 ml  Net             -250 ml   Filed Weights   08/26/17 0743 08/26/17 1500  Weight: 195 lb (88.5 kg) 194 lb 14.2 oz (88.4 kg)   Body mass index is 35.65 kg/m.  General:  Well  nourished, well developed, in no acute distress HEENT: normal Lymph: no adenopathy Neck: no JVD Endocrine:  No thryomegaly Vascular: No carotid bruits; FA pulses 2+ bilaterally without bruits  Cardiac:  normal S1, S2; RRR; no murmur  Lungs:  clear to auscultation bilaterally, no wheezing, rhonchi or rales  Abd: soft, nontender, no hepatomegaly  Ext: no edema Musculoskeletal:  No deformities, BUE and BLE strength normal and equal Skin: warm and dry  Neuro:  CNs 2-12 intact, no focal abnormalities noted Psych:  Normal affect   EKG:  The EKG was personally reviewed and demonstrates:  Sinus rhythm at 74 bpm, PVC and  Incomplete LBBB Telemetry:  Telemetry was personally reviewed and demonstrates:  Sinus rhythm with PVC's, rates 80's-90's. 9 beats of NCT noted at 0647.  Relevant CV Studies:  Echocardiogram 08/27/17 Study Conclusions - Left ventricle: The cavity size was normal. Wall thickness was   increased in a pattern of mild LVH. Systolic function was normal.   The estimated ejection fraction was in the range of 55% to 60%.   Wall motion was normal; there were no regional wall motion   abnormalities. Doppler parameters are consistent with abnormal   left ventricular relaxation (grade 1 diastolic dysfunction). - Mitral valve: Calcified annulus. There was mild regurgitation. - Right atrium: The atrium was moderately dilated. - Atrial septum: There was an atrial septal aneurysm. - Pulmonary arteries: Systolic pressure was mildly increased. PA   peak pressure: 49 mm Hg (S).  Impressions: - Normal LV systolic function; mild LVH; mild diastolic   dysfunction; mild MR; moderate RAE; mild TR with mildly elevated   pulmonary pressure.   Laboratory  Data:  Chemistry Recent Labs Lab 08/26/17 0803 08/27/17 0232  NA 137 137  K 3.6 3.2*  CL 104 104  CO2 25 23  GLUCOSE 94 118*  BUN 27* 26*  CREATININE 1.53* 1.24*  CALCIUM 8.5* 8.5*  GFRNONAA 32* 41*  GFRAA 37* 47*  ANIONGAP 8 10     Recent Labs Lab 08/26/17 0803 08/27/17 0232  PROT 6.8  --   ALBUMIN 3.0* 2.7*  AST 26  --   ALT 27  --   ALKPHOS 75  --   BILITOT 0.7  --    Hematology Recent Labs Lab 08/26/17 0803 08/27/17 0232  WBC 14.3* 15.0*  RBC 3.51* 3.46*  HGB 10.2* 10.1*  HCT 31.8* 30.9*  MCV 90.6 89.3  MCH 29.1 29.2  MCHC 32.1 32.7  RDW 14.3 14.9  PLT 180 177   Cardiac Enzymes Recent Labs Lab 08/26/17 0803 08/26/17 1455 08/26/17 1940 08/27/17 0232  TROPONINI 0.58* 0.71* 0.49* 0.42*   No results for input(s): TROPIPOC in the last 168 hours.  BNP Recent Labs Lab 08/26/17 1455  BNP 311.4*    DDimer No results for input(s): DDIMER in the last 168 hours.  Radiology/Studies:  Ct Abdomen Pelvis W Contrast  Result Date: 08/26/2017 CLINICAL DATA:  Abdominal pain.  Nausea and vomiting for 2 days EXAM: CT ABDOMEN AND PELVIS WITH CONTRAST TECHNIQUE: Multidetector CT imaging of the abdomen and pelvis was performed using the standard protocol following bolus administration of intravenous contrast. CONTRAST:  57mL ISOVUE-300 IOPAMIDOL (ISOVUE-300) INJECTION 61% COMPARISON:  None FINDINGS: Lower chest: Lung bases are clear. Hepatobiliary: No focal hepatic lesion. No biliary duct dilatation. Gallbladder is normal. Common bile duct is normal. Pancreas: Pancreas is normal. No ductal dilatation. No pancreatic inflammation. Spleen: Normal spleen Adrenals/urinary tract: Adrenal glands are normal. There is mild perinephric stranding bilateral. There is  subtle enhancement of the epithelium of the LEFT and RIGHT renal pelves (image 36, series 2 for example). Ureters and bladder normal. Stomach/Bowel: Stomach, small-bowel, cecum normal. Appendix not identified. Colon  rectosigmoid colon are normal. Vascular/Lymphatic: Abdominal aorta is normal caliber with atherosclerotic calcification. There is no retroperitoneal or periportal lymphadenopathy. No pelvic lymphadenopathy. Reproductive: Post hysterectomy anatomy Other: No free fluid. Musculoskeletal: No aggressive osseous lesion. IMPRESSION: 1. Enhancement of the epithelium of the LEFT and RIGHT renal pelves. Recommend clinical correlation for urinary tract infections. 2. No additional evidence of infection in the abdomen pelvis. 3.  Aortic Atherosclerosis (ICD10-I70.0). Electronically Signed   By: Suzy Bouchard M.D.   On: 08/26/2017 10:07   Dg Chest Port 1 View  Result Date: 08/26/2017 CLINICAL DATA:  Short of breath EXAM: PORTABLE CHEST 1 VIEW COMPARISON:  None. FINDINGS: The stable large cardiac silhouette. There is central venous pulmonary congestion. Mild peripheral interstitial edema. Vascular shadow projecting over the medial aspect of the RIGHT upper lobe. Airways normal. No pneumothorax. IMPRESSION: Central venous congestion and mild interstitial edema. Electronically Signed   By: Suzy Bouchard M.D.   On: 08/26/2017 11:13    Assessment and Plan:   1. Elevated troponin: Pt admitted for possible pyelonephritis/sepsis. No chest pain or shortness of breath. No previous cardiac hx. Noted to have mildly elevated troponins with peak of 0.71, most recent 0.42. EKG without significant ischemic changes. CXR with mild interstitial edema and BNP mildly elevated at 311. Echo shows normal LV function with EF 55-60%, mild LVH, no RWMA, mild diastolic dysfunction and mildly elevated PA pressure. Pt appears euvolemic. Troponins are elevated in pattern of demand ischemia in setting of sepsis. Volume appears stable, would not pursue any further cardiac testing at present and would not diurese unless she becomes symptomatic.   2. SVT: 9 beats of narrow complex tachycardia noted on tele this am. Baseline rhythm is NSR with  PVC's. K+ was 3.2 and magnesium 1.8. Was given K Dur 40 mEq. (was on 20 mEq bid at home) Would give magnesium with goal of Mg >=2. At home pt is on carvedilol 12.5 mg. Held for hypotension is setting of sepsis. Now resumed at low dose 3.125 mg. Would titrate back up as tolerated.   4. Possible pyelonephritis. Fever 102.5 and leukocytosis concerning for sepsis. Management per IM. Cultures done and IV antibiotics initiated.   5. Acute renal injury: SCr up to 1.53 on admission, improving at 1.24 today. Likely related to acute urinary infection along with recent N/V. Continue to monitor.   6. Hypertension: Home meds include carvedilol 12.5 mg, amlodipine 5 mg, and Diovan HCT 160-12.5 mg. Currently BP meds on hold in setting of sepsis except carvedilol added back at low dose for NSVT. BP is stable with no significant hypotension.   7. Hyperlipidemia: LDL 123 in 02/2017. Continue simvastatin and follow up with PCP.   For questions or updates, please contact [place] Please consult [email or web address] for contact info under Cardiology/STEMI.   Signed, Daune Perch, NP  08/27/2017 12:52 PM

## 2017-08-27 NOTE — Progress Notes (Signed)
  Echocardiogram 2D Echocardiogram has been performed.  Lynn Swanson 08/27/2017, 11:34 AM

## 2017-08-28 DIAGNOSIS — R748 Abnormal levels of other serum enzymes: Secondary | ICD-10-CM

## 2017-08-28 DIAGNOSIS — I471 Supraventricular tachycardia: Secondary | ICD-10-CM

## 2017-08-28 LAB — BASIC METABOLIC PANEL
ANION GAP: 7 (ref 5–15)
BUN: 20 mg/dL (ref 6–20)
CALCIUM: 8.6 mg/dL — AB (ref 8.9–10.3)
CO2: 24 mmol/L (ref 22–32)
Chloride: 111 mmol/L (ref 101–111)
Creatinine, Ser: 0.87 mg/dL (ref 0.44–1.00)
GFR calc Af Amer: >60 mL/min (ref >60)
GFR calc non Af Amer: >60 mL/min (ref >60)
GLUCOSE: 183 mg/dL — AB (ref 65–99)
Potassium: 4.2 mmol/L (ref 3.5–5.1)
Sodium: 142 mmol/L (ref 135–145)

## 2017-08-28 LAB — CBC
HEMATOCRIT: 30.1 % — AB (ref 36.0–46.0)
Hemoglobin: 10.1 g/dL — ABNORMAL LOW (ref 12.0–15.0)
MCH: 29.9 pg (ref 26.0–34.0)
MCHC: 33.6 g/dL (ref 30.0–36.0)
MCV: 89.1 fL (ref 78.0–100.0)
Platelets: 179 10*3/uL (ref 150–400)
RBC: 3.38 MIL/uL — ABNORMAL LOW (ref 3.87–5.11)
RDW: 14.8 % (ref 11.5–15.5)
WBC: 13.7 10*3/uL — AB (ref 4.0–10.5)

## 2017-08-28 LAB — GLUCOSE, CAPILLARY
GLUCOSE-CAPILLARY: 187 mg/dL — AB (ref 65–99)
Glucose-Capillary: 121 mg/dL — ABNORMAL HIGH (ref 65–99)
Glucose-Capillary: 184 mg/dL — ABNORMAL HIGH (ref 65–99)
Glucose-Capillary: 171 mg/dL — ABNORMAL HIGH (ref 65–99)

## 2017-08-28 MED ORDER — CARVEDILOL 12.5 MG PO TABS
12.5000 mg | ORAL_TABLET | Freq: Two times a day (BID) | ORAL | Status: DC
  Administered 2017-08-28 – 2017-08-29 (×2): 12.5 mg via ORAL
  Filled 2017-08-28 (×2): qty 1

## 2017-08-28 MED ORDER — AMLODIPINE BESYLATE 5 MG PO TABS
5.0000 mg | ORAL_TABLET | Freq: Every day | ORAL | Status: DC
  Administered 2017-08-28 – 2017-08-29 (×2): 5 mg via ORAL
  Filled 2017-08-28 (×2): qty 1

## 2017-08-28 MED ORDER — POLYETHYLENE GLYCOL 3350 17 G PO PACK
17.0000 g | PACK | Freq: Two times a day (BID) | ORAL | Status: DC
  Administered 2017-08-28 – 2017-08-29 (×3): 17 g via ORAL
  Filled 2017-08-28 (×3): qty 1

## 2017-08-28 NOTE — Progress Notes (Signed)
Spoke with pt concerning HHPT/RN needs.  Pt states that she attends [facility] everyday. A call to [facility] confirmed that pt is a client there. [facility] provides RN/PT for pt's at the Center.

## 2017-08-28 NOTE — Evaluation (Signed)
Physical Therapy Evaluation Patient Details Name: Lynn Swanson MRN: [id number] DOB: [birth year] Today's Date: 08/28/2017   History of Present Illness  Pt admitted 2* N/V and abdominal pain with acute pyelonephritis, and demand ischemia.  Pt with hx of DM  Clinical Impression  Pt admitted as above and presenting with functional mobility limitations 2* generalized weakness, limited endurance and ambulatory balance deficits.  Pt should progress to dc home with family assist.    Follow Up Recommendations Home health PT    Equipment Recommendations  Rolling walker with 5" wheels (Youth height walker)    Recommendations for Other Services       Precautions / Restrictions Precautions Precautions: Fall Restrictions Weight Bearing Restrictions: No      Mobility  Bed Mobility Overal bed mobility: Modified Independent             General bed mobility comments: Increased time but pt unassisted supine<>sit  Transfers Overall transfer level: Needs assistance Equipment used: None Transfers: Sit to/from Stand Sit to Stand: Min guard         General transfer comment: to steady with initial standing  Ambulation/Gait Ambulation/Gait assistance: Min assist;Min guard Ambulation Distance (Feet): 200 Feet Assistive device: 1 person hand held assist;Rolling walker (2 wheeled) Gait Pattern/deviations: Step-through pattern;Decreased step length - right;Decreased step length - left;Shuffle;Trunk flexed;Wide base of support Gait velocity: decr Gait velocity interpretation: Below normal speed for age/gender General Gait Details: Pt initiated ambulation but requiring HHA and reaching for furniture to stabilize.  Marked improvement with stability using RW "this is alright".  Pt ambulated 200' twice with sitting rest break between and standing rest breaks durign to complete task  Stairs            Wheelchair Mobility    Modified Rankin (Stroke Patients Only)       Balance  Overall balance assessment: Needs assistance Sitting-balance support: No upper extremity supported;Feet supported Sitting balance-Leahy Scale: Good     Standing balance support: No upper extremity supported Standing balance-Leahy Scale: Fair                               Pertinent Vitals/Pain Pain Assessment: Faces Faces Pain Scale: No hurt    Home Living Family/patient expects to be discharged to:: Private residence Living Arrangements: Children;Other relatives Available Help at Discharge: Family;Available PRN/intermittently Type of Home: House       Home Layout: One level Home Equipment: Cane - single point Additional Comments: Pt states she lives with dtr, grandson and great granddaughter.  Pt states she goes by bus to "the center on [address] in [place], sometimes every day"    Prior Function Level of Independence: Independent;Independent with assistive device(s)         Comments: Pt states she uses cane when she leaves the house and "they make me use it when I go to the center"     Hand Dominance        Extremity/Trunk Assessment   Upper Extremity Assessment Upper Extremity Assessment: Generalized weakness    Lower Extremity Assessment Lower Extremity Assessment: Generalized weakness       Communication   Communication: No difficulties  Cognition Arousal/Alertness: Awake/alert Behavior During Therapy: WFL for tasks assessed/performed Overall Cognitive Status: Within Functional Limits for tasks assessed  General Comments      Exercises     Assessment/Plan    PT Assessment Patient needs continued PT services  PT Problem List Decreased strength;Decreased range of motion;Decreased activity tolerance;Decreased mobility;Decreased knowledge of use of DME;Obesity;Decreased balance       PT Treatment Interventions DME instruction;Gait training;Stair training;Functional mobility  training;Therapeutic activities;Therapeutic exercise;Balance training;Patient/family education    PT Goals (Current goals can be found in the Care Plan section)  Acute Rehab PT Goals Patient Stated Goal: Home PT Goal Formulation: With patient Time For Goal Achievement: 09/11/17 Potential to Achieve Goals: Fair    Frequency Min 3X/week   Barriers to discharge        Co-evaluation               AM-PAC PT "6 Clicks" Daily Activity  Outcome Measure Difficulty turning over in bed (including adjusting bedclothes, sheets and blankets)?: A Little Difficulty moving from lying on back to sitting on the side of the bed? : A Lot Difficulty sitting down on and standing up from a chair with arms (e.g., wheelchair, bedside commode, etc,.)?: A Lot Help needed moving to and from a bed to chair (including a wheelchair)?: A Little Help needed walking in hospital room?: A Little Help needed climbing 3-5 steps with a railing? : A Little 6 Click Score: 16    End of Session Equipment Utilized During Treatment: Gait belt Activity Tolerance: Patient tolerated treatment well Patient left: in bed;with call bell/phone within reach;with bed alarm set Nurse Communication: Mobility status PT Visit Diagnosis: Unsteadiness on feet (R26.81);Muscle weakness (generalized) (M62.81);Difficulty in walking, not elsewhere classified (R26.2)    Time: 0935-1000 PT Time Calculation (min) (ACUTE ONLY): 25 min   Charges:   PT Evaluation $PT Eval Low Complexity: 1 Low PT Treatments $Gait Training: 8-22 mins   PT G Codes:        Pg [phone]   Janeth Terry 08/28/2017, 12:15 PM

## 2017-08-28 NOTE — Progress Notes (Signed)
I agree with the previous Nurse's assessment.

## 2017-08-28 NOTE — Progress Notes (Signed)
PROGRESS NOTE    [name]  CHE:[id number] DOB: 1941-07-10 DOA: 08/26/2017 PCP: Dorothyann Peng, NP   Brief Narrative:  Lynn Swanson is Lynn Swanson 76 y.o. female with medical history significant of type 2 diabetes on insulin, hypertension, abdominal hysterectomy presented with nausea vomiting, generalized abdominal pain, dysuria for 2 days.  She was found to have imaging findings c/w pyelonephritis as well as elevated troponin felt consistent with demand ischemia.    Assessment & Plan:   Active Problems:   Type 2 diabetes mellitus with complication, with long-term current use of insulin (HCC)   Essential hypertension, benign   Hypercholesteremia   Vomiting   Acute pyelonephritis   Demand ischemia ([place])   AKI (acute kidney injury) ([place])   Sepsis 2/2 Urinary tract infection: CT findings with evidence of pyelo (enhancement of epithelium of L and R renal pelves) and UA with many bacteria, WBC's, and + LE.  Fever this AM.    [ ]  urine culture pending -> e. Coli, sensitivities pending (discussed with micro who note being repeated as looks like there is some resistance) [ ]  bcx NGTD x 2 days (after abx) Ceftriaxone APAP, tramadol, zofran prn Hemodynamically stable, will transfer to tele bed.  Elevated troponin:  Likely demand ischemia.  Discussed with Dr. Harrington Challenger by admitting provider. Peaked at 0.71, now downtrending.  EKG with nonspecific ST depression, CXR with mild edema, central venous congestion.   She denied any CP or SOB. ProBNP elevated to 311.4 Echo - normal systolic function, mild LVH, diastolic dysfunction, mild MR, moderate RAE, mild TR with mildly elevated pulm pressure [ ]  Cards c/s  - plan for outpatient cardiology follow up, consider stress testing as outpatient  Nonsustained Vtach: Sinus overnight on tele Continue coreg at lower dose Echo pending Goal K>4, mag>2, ctm  #Type 2 diabetes on chronic insulin: Continue Lantus and sliding scale (lantus at lower dose 30 units  instead of 75). I resume lower dose of Lantus because patient with decreased oral intake and on clear liquid diet. Hemoglobin A1c 5.8. Monitor blood sugar level.  # History of hypertension: Patient with borderline low blood pressure likely because of infection. valsartan-HCTZ.  Carvedilol increased to home dose, restart amlodipine. Continue monitor.  # Acute kidney injury likely due to acute pyelonephritis: Improving, holding valsartan/HCTZ as above.  Continue to monitor.  # Hypokalemia: replete prn  # HLD: statin # Gerd: protonix # Glaucoma: latanoprost, dorzolamide  DVT prophylaxis: lovenox Code Status: full  Family Communication: none in room Disposition Plan: pending improvement   Consultants:   Cardiology  Procedures: (Don't include imaging studies which can be auto populated. Include things that cannot be auto populated i.e. Echo, Carotid and venous dopplers, Foley, Bipap, HD, tubes/drains, wound vac, central lines etc)  Echo with normal LV systolic function, mild LVH, mild diastolic dysfunction, mild MR, mod RAE, mild TR with mildly elevated pulmonary pressure  Antimicrobials: (specify start and planned stop date. Auto populated tables are space occupying and do not give end dates)  ceftriaxone 10/23 -    Subjective: No CP, abdominal pain.  Does note constipation.  Feeling better overall.  Would like to get out of here.  Objective: Vitals:   08/27/17 1600 08/27/17 1707 08/27/17 2011 08/28/17 0513  BP:  (!) 146/49 (!) 136/58 (!) 147/65  Pulse:  89 75 83  Resp:   20 20  Temp: 100.1 F (37.8 C)  98.3 F (36.8 C) 99 F (37.2 C)  TempSrc: Oral  Oral Oral  SpO2:  95% 94%  Weight:   88.6 kg (195 lb 5.2 oz)   Height:   5\' 2"  (1.575 m)     Intake/Output Summary (Last 24 hours) at 08/28/17 0854 Last data filed at 08/27/17 1300  Gross per 24 hour  Intake              290 ml  Output                0 ml  Net              290 ml   Filed Weights   08/26/17 0743  08/26/17 1500 08/27/17 2011  Weight: 88.5 kg (195 lb) 88.4 kg (194 lb 14.2 oz) 88.6 kg (195 lb 5.2 oz)    Examination:  General: No acute distress.  Sitting at edge of bed. Cardiovascular: Heart sounds show Lyda Colcord regular rate, and rhythm. No gallops or rubs. No murmurs. No JVD. Lungs: Clear to auscultation bilaterally with good air movement. No rales, rhonchi or wheezes. Abdomen: Soft, nontender, nondistended with normal active bowel sounds. No masses. No hepatosplenomegaly. Neurological: Alert and oriented 3. Moves all extremities 4 with equal strength. Cranial nerves II through XII grossly intact. Skin: Warm and dry. No rashes or lesions. Extremities: No clubbing or cyanosis. No edema. Pedal pulses 2+. Psychiatric: Mood and affect are normal. Insight and judgment are appropriate.  Data Reviewed: I have personally reviewed following labs and imaging studies  CBC:  Recent Labs Lab 08/26/17 0803 08/27/17 0232 08/28/17 0419  WBC 14.3* 15.0* 13.7*  HGB 10.2* 10.1* 10.1*  HCT 31.8* 30.9* 30.1*  MCV 90.6 89.3 89.1  PLT 180 177 332   Basic Metabolic Panel:  Recent Labs Lab 08/26/17 0803 08/26/17 1455 08/27/17 0232 08/28/17 0419  NA 137  --  137 142  K 3.6  --  3.2* 4.2  CL 104  --  104 111  CO2 25  --  23 24  GLUCOSE 94  --  118* 183*  BUN 27*  --  26* 20  CREATININE 1.53*  --  1.24* 0.87  CALCIUM 8.5*  --  8.5* 8.6*  MG  --  1.7 1.8  --    GFR: Estimated Creatinine Clearance: 56 mL/min (by C-G formula based on SCr of 0.87 mg/dL). Liver Function Tests:  Recent Labs Lab 08/26/17 0803 08/27/17 0232  AST 26  --   ALT 27  --   ALKPHOS 75  --   BILITOT 0.7  --   PROT 6.8  --   ALBUMIN 3.0* 2.7*    Recent Labs Lab 08/26/17 0803  LIPASE 15   No results for input(s): AMMONIA in the last 168 hours. Coagulation Profile: No results for input(s): INR, PROTIME in the last 168 hours. Cardiac Enzymes:  Recent Labs Lab 08/26/17 0803 08/26/17 1455 08/26/17 1940  08/27/17 0232  TROPONINI 0.58* 0.71* 0.49* 0.42*   BNP (last 3 results) No results for input(s): PROBNP in the last 8760 hours. HbA1C:  Recent Labs  08/26/17 1455  HGBA1C 5.8*   CBG:  Recent Labs Lab 08/27/17 0719 08/27/17 1138 08/27/17 1650 08/27/17 2250 08/28/17 0807  GLUCAP 83 170* 221* 200* 121*   Lipid Profile: No results for input(s): CHOL, HDL, LDLCALC, TRIG, CHOLHDL, LDLDIRECT in the last 72 hours. Thyroid Function Tests:  Recent Labs  08/26/17 1455  TSH 1.336   Anemia Panel: No results for input(s): VITAMINB12, FOLATE, FERRITIN, TIBC, IRON, RETICCTPCT in the last 72 hours. Sepsis Labs:  Recent Labs Lab  08/26/17 1502 08/26/17 1645  LATICACIDVEN 0.8 0.9    Recent Results (from the past 240 hour(s))  Urine culture     Status: Abnormal (Preliminary result)   Collection Time: 08/26/17  7:51 AM  Result Value Ref Range Status   Specimen Description URINE, CLEAN CATCH  Final   Special Requests NONE  Final   Culture (Arisa Congleton)  Final    >=100,000 COLONIES/mL ESCHERICHIA COLI REPEATING SUSCEPTIBILITIES Performed at [facility], [place] [address] [place], [place] [address]    Report Status PENDING  Incomplete  MRSA PCR Screening     Status: None   Collection Time: 08/26/17  1:18 PM  Result Value Ref Range Status   MRSA by PCR NEGATIVE NEGATIVE Final    Comment:        The GeneXpert MRSA Assay (FDA approved for NASAL specimens only), is one component of Melah Ebling comprehensive MRSA colonization surveillance program. It is not intended to diagnose MRSA infection nor to guide or monitor treatment for MRSA infections.   Culture, blood (routine x 2)     Status: None (Preliminary result)   Collection Time: 08/26/17  2:43 PM  Result Value Ref Range Status   Specimen Description BLOOD LEFT ANTECUBITAL  Final   Special Requests IN PEDIATRIC BOTTLE Blood Culture adequate volume  Final   Culture   Final    NO GROWTH < 24 HOURS Performed at [place], [place] [address] [place], [place] [address]    Report Status PENDING  Incomplete  Culture, blood (routine x 2)     Status: None (Preliminary result)   Collection Time: 08/26/17  3:00 PM  Result Value Ref Range Status   Specimen Description BLOOD LEFT HAND  Final   Special Requests   Final    BOTTLES DRAWN AEROBIC ONLY Blood Culture adequate volume   Culture   Final    NO GROWTH < 24 HOURS Performed at [facility], [place] [address], [place], [place] [address]    Report Status PENDING  Incomplete         Radiology Studies: Ct Abdomen Pelvis W Contrast  Result Date: 08/26/2017 CLINICAL DATA:  Abdominal pain.  Nausea and vomiting for 2 days EXAM: CT ABDOMEN AND PELVIS WITH CONTRAST TECHNIQUE: Multidetector CT imaging of the abdomen and pelvis was performed using the standard protocol following bolus administration of intravenous contrast. CONTRAST:  25mL ISOVUE-300 IOPAMIDOL (ISOVUE-300) INJECTION 61% COMPARISON:  None FINDINGS: Lower chest: Lung bases are clear. Hepatobiliary: No focal hepatic lesion. No biliary duct dilatation. Gallbladder is normal. Common bile duct is normal. Pancreas: Pancreas is normal. No ductal dilatation. No pancreatic inflammation. Spleen: Normal spleen Adrenals/urinary tract: Adrenal glands are normal. There is mild perinephric stranding bilateral. There is subtle enhancement of the epithelium of the LEFT and RIGHT renal pelves (image 36, series 2 for example). Ureters and bladder normal. Stomach/Bowel: Stomach, small-bowel, cecum normal. Appendix not identified. Colon rectosigmoid colon are normal. Vascular/Lymphatic: Abdominal aorta is normal caliber with atherosclerotic calcification. There is no retroperitoneal or periportal lymphadenopathy. No pelvic lymphadenopathy. Reproductive: Post hysterectomy anatomy Other: No free fluid. Musculoskeletal: No aggressive osseous lesion. IMPRESSION: 1. Enhancement of the epithelium of the LEFT and RIGHT renal pelves.  Recommend clinical correlation for urinary tract infections. 2. No additional evidence of infection in the abdomen pelvis. 3.  Aortic Atherosclerosis (ICD10-I70.0). Electronically Signed   By: Suzy Bouchard M.D.   On: 08/26/2017 10:07   Dg Chest Port 1 View  Result Date:  08/26/2017 CLINICAL DATA:  Short of breath EXAM: PORTABLE CHEST 1 VIEW COMPARISON:  None. FINDINGS: The stable large cardiac silhouette. There is central venous pulmonary congestion. Mild peripheral interstitial edema. Vascular shadow projecting over the medial aspect of the RIGHT upper lobe. Airways normal. No pneumothorax. IMPRESSION: Central venous congestion and mild interstitial edema. Electronically Signed   By: Suzy Bouchard M.D.   On: 08/26/2017 11:13        Scheduled Meds: . carvedilol  12.5 mg Oral BID WC  . dorzolamide  1 drop Both Eyes BID  . enoxaparin (LOVENOX) injection  40 mg Subcutaneous Q24H  . insulin aspart  0-5 Units Subcutaneous QHS  . insulin aspart  0-9 Units Subcutaneous TID WC  . insulin glargine  30 Units Subcutaneous QHS  . latanoprost  1 drop Both Eyes QHS  . pantoprazole  40 mg Oral Daily  . polyethylene glycol  17 g Oral BID  . potassium chloride SA  20 mEq Oral BID  . simvastatin  20 mg Oral Daily   Continuous Infusions: . cefTRIAXone (ROCEPHIN)  IV Stopped (08/27/17 0958)     LOS: 2 days    Time spent: over 25 minutes    Fayrene Helper, MD [facility] Pager [phone]  If 7PM-7AM, please contact night-coverage [email or web address] Password [facility] 08/28/2017, 8:54 AM

## 2017-08-28 NOTE — Evaluation (Signed)
Occupational Therapy Evaluation Patient Details Name: Lynn Swanson MRN: [id number] DOB: [birth year] Today's Date: 08/28/2017    History of Present Illness Pt admitted 2* N/V and abdominal pain with acute pyelonephritis, and demand ischemia.  Pt with hx of DM   Clinical Impression   This 76 year old female was admitted for the above. She was independent with adls prior to admission. She needs min A at this time. Will follow in acute setting with set up to mod I level goals.    Follow Up Recommendations  Home health OT    Equipment Recommendations  None recommended by OT    Recommendations for Other Services       Precautions / Restrictions Precautions Precautions: Fall Restrictions Weight Bearing Restrictions: No      Mobility Bed Mobility Overal bed mobility: Modified Independent             General bed mobility comments: Increased time but pt unassisted supine<>sit  Transfers Overall transfer level: Needs assistance Equipment used: Rolling walker (2 wheeled) (youth) Transfers: Sit to/from Stand Sit to Stand: Supervision         General transfer comment: cues for UE placement    Balance Overall balance assessment: Needs assistance Sitting-balance support: No upper extremity supported;Feet supported Sitting balance-Leahy Scale: Good     Standing balance support: No upper extremity supported Standing balance-Leahy Scale: Fair                             ADL either performed or assessed with clinical judgement   ADL Overall ADL's : Needs assistance/impaired Eating/Feeding: Independent   Grooming: Wash/dry hands;Wash/dry face;Supervision/safety;Standing   Upper Body Bathing: Set up;Sitting   Lower Body Bathing: Minimal assistance;Sit to/from stand   Upper Body Dressing : Set up;Sitting   Lower Body Dressing: Minimal assistance;Sit to/from stand   Toilet Transfer: Supervision/safety;Ambulation;RW   Toileting- Clothing Manipulation  and Hygiene: Supervision/safety;Sit to/from stand         General ADL Comments: used youth walker.  Pt has a tub at home. She has family assist with washing her back. Recommended they assist her stepping in and out and during bathing unless she gets a seat, which is out of pocket     Vision         Perception     Praxis      Pertinent Vitals/Pain Pain Assessment: Faces Faces Pain Scale: No hurt     Hand Dominance     Extremity/Trunk Assessment Upper Extremity Assessment Upper Extremity Assessment: Generalized weakness          Communication Communication Communication: No difficulties   Cognition Arousal/Alertness: Awake/alert Behavior During Therapy: WFL for tasks assessed/performed Overall Cognitive Status: Within Functional Limits for tasks assessed                                     General Comments       Exercises     Shoulder Instructions      Home Living Family/patient expects to be discharged to:: Private residence Living Arrangements: Children;Other relatives Available Help at Discharge: Family;Available PRN/intermittently Type of Home: House       Home Layout: One level               Home Equipment: Cane - single point   Additional Comments: Pt states she lives with dtr, grandson and great granddaughter.  Pt states she goes by bus to "the center on [address] in [place], sometimes every day"      Prior Functioning/Environment Level of Independence: Independent;Independent with assistive device(s)        Comments: Pt states she uses cane when she leaves the house and "they make me use it when I go to the center"        OT Problem List: Impaired balance (sitting and/or standing);Decreased knowledge of use of DME or AE      OT Treatment/Interventions: Self-care/ADL training;DME and/or AE instruction;Balance training;Patient/family education    OT Goals(Current goals can be found in the care plan section) Acute  Rehab OT Goals Patient Stated Goal: Home OT Goal Formulation: With patient Time For Goal Achievement: 09/04/17 Potential to Achieve Goals: Good ADL Goals Pt Will Transfer to Toilet: with modified independence;regular height toilet;ambulating Additional ADL Goal #1: pt will complete adl with set up only  OT Frequency: Min 2X/week   Barriers to D/C:            Co-evaluation              AM-PAC PT "6 Clicks" Daily Activity     Outcome Measure Help from another person eating meals?: None Help from another person taking care of personal grooming?: A Little Help from another person toileting, which includes using toliet, bedpan, or urinal?: A Little Help from another person bathing (including washing, rinsing, drying)?: A Little Help from another person to put on and taking off regular upper body clothing?: A Little Help from another person to put on and taking off regular lower body clothing?: A Little 6 Click Score: 19   End of Session    Activity Tolerance: Patient tolerated treatment well Patient left: in chair;with call bell/phone within reach;with chair alarm set;with family/visitor present  OT Visit Diagnosis: Unsteadiness on feet (R26.81)                Time: 6579-0383 OT Time Calculation (min): 23 min Charges:  OT General Charges $OT Visit: 1 Visit OT Evaluation $OT Eval Low Complexity: 1 Low G-Codes:     O'Neill, OTR/L [phone] 08/28/2017  Karle Desrosier 08/28/2017, 2:04 PM

## 2017-08-29 LAB — BASIC METABOLIC PANEL
Anion gap: 7 (ref 5–15)
BUN: 14 mg/dL (ref 6–20)
CALCIUM: 8.6 mg/dL — AB (ref 8.9–10.3)
CO2: 25 mmol/L (ref 22–32)
CREATININE: 0.85 mg/dL (ref 0.44–1.00)
Chloride: 107 mmol/L (ref 101–111)
GFR calc non Af Amer: >60 mL/min (ref >60)
GLUCOSE: 151 mg/dL — AB (ref 65–99)
Potassium: 4.5 mmol/L (ref 3.5–5.1)
Sodium: 139 mmol/L (ref 135–145)

## 2017-08-29 LAB — CBC
HEMATOCRIT: 32.2 % — AB (ref 36.0–46.0)
Hemoglobin: 10.5 g/dL — ABNORMAL LOW (ref 12.0–15.0)
MCH: 28.9 pg (ref 26.0–34.0)
MCHC: 32.6 g/dL (ref 30.0–36.0)
MCV: 88.7 fL (ref 78.0–100.0)
Platelets: 171 10*3/uL (ref 150–400)
RBC: 3.63 MIL/uL — ABNORMAL LOW (ref 3.87–5.11)
RDW: 14.6 % (ref 11.5–15.5)
WBC: 9.8 10*3/uL (ref 4.0–10.5)

## 2017-08-29 LAB — URINE CULTURE

## 2017-08-29 LAB — MAGNESIUM: MAGNESIUM: 2.1 mg/dL (ref 1.7–2.4)

## 2017-08-29 LAB — GLUCOSE, CAPILLARY: Glucose-Capillary: 99 mg/dL (ref 65–99)

## 2017-08-29 MED ORDER — AMOXICILLIN 500 MG PO TABS: 500.0000 mg | ORAL_TABLET | Freq: Two times a day (BID) | ORAL | 0 refills | Status: DC

## 2017-08-29 NOTE — Discharge Instructions (Signed)

## 2017-08-29 NOTE — Progress Notes (Signed)
Patient and family given discharge, medication, and follow up instructions, verbalized understanding, IV and telemetry monitor removed, prescription and personal belongings with patient, family to transport home

## 2017-08-29 NOTE — Progress Notes (Signed)
Patient had 7 beats of V-Tach this past evening.  PCP was notified.

## 2017-08-29 NOTE — Discharge Summary (Addendum)
Physician Discharge Summary  Lynn Swanson FTD:[id number] DOB: 11-18-1940 DOA: 08/26/2017  PCP: Dorothyann Peng, NP  Admit date: 08/26/2017 Discharge date: 08/29/2017  Recommendations for Outpatient Follow-up:  1. Pt will need to follow up with PCP in 1-2 weeks post discharge 2. Please obtain BMP to evaluate electrolytes and kidney function 3. Please also check CBC to evaluate Hg and Hct levels 4. Pt to complete therapy with Amoxicillin for 7 more days post discharge   Discharge Diagnoses:  Active Problems:   Type 2 diabetes mellitus with complication, with long-term current use of insulin (HCC)   Essential hypertension, benign   Hypercholesteremia   Vomiting  Discharge Condition: Stable  Diet recommendation: Heart healthy diet discussed in details   History of present illness:  76 y.o.femalewith medical history significant of type 2 diabetes on insulin, hypertension, abdominal hysterectomy presented with nausea vomiting, generalized abdominal pain, dysuria for 2 days.  She was found to have imaging findings c/w pyelonephritis as well as elevated troponin felt consistent with demand ischemia.   Assessment & Plan:  Sepsis 2/2 Urinary tract infection: CT findings with evidence of pyelo (enhancement of epithelium of L and R renal pelves) and UA with many bacteria, WBC's, and + LE.  Fever this AM.    - urine culture with E. Coli and pan sensitive - change ABX to Amoxicillin on discharge for 7 more days post discharge   Elevated troponin:  Likely demand ischemia.  Discussed with Dr. Harrington Challenger by admitting provider. Peaked at 0.71, now downtrending.  EKG with nonspecific ST depression, CXR with mild edema, central venous congestion.   Echo - normal systolic function, mild LVH, diastolic dysfunction, mild MR, moderate RAE, mild TR with mildly elevated pulm pressure - per cardiology, recommend outpatient follow up, outpatient stress test  - no chest pain this AM   Nonsustained Vtach:  Sinus overnight on tele - currently HR stable   #Type 2 diabetes on chronic insulin:  - resume home medical regimen  # History of hypertension:  - resume home medical regimen   # Acute kidney injury likely due to acute pyelonephritis:  - resolved   # Hypokalemia: - supplemented and WNL  # HLD: statin # Gerd: protonix # Glaucoma: latanoprost, dorzolamide  DVT prophylaxis: lovenox Code Status: full  Family Communication: pt at bedside  Disposition Plan: home today   Consultants:   Cardiology  Procedures:   Echo with normal LV systolic function, mild LVH, mild diastolic dysfunction, mild MR, mod RAE, mild TR with mildly elevated pulmonary pressure  Antimicrobials:  ceftriaxone 10/23 -   Procedures/Studies: Ct Abdomen Pelvis W Contrast  Result Date: 08/26/2017 CLINICAL DATA:  Abdominal pain.  Nausea and vomiting for 2 days EXAM: CT ABDOMEN AND PELVIS WITH CONTRAST TECHNIQUE: Multidetector CT imaging of the abdomen and pelvis was performed using the standard protocol following bolus administration of intravenous contrast. CONTRAST:  14mL ISOVUE-300 IOPAMIDOL (ISOVUE-300) INJECTION 61% COMPARISON:  None FINDINGS: Lower chest: Lung bases are clear. Hepatobiliary: No focal hepatic lesion. No biliary duct dilatation. Gallbladder is normal. Common bile duct is normal. Pancreas: Pancreas is normal. No ductal dilatation. No pancreatic inflammation. Spleen: Normal spleen Adrenals/urinary tract: Adrenal glands are normal. There is mild perinephric stranding bilateral. There is subtle enhancement of the epithelium of the LEFT and RIGHT renal pelves (image 36, series 2 for example). Ureters and bladder normal. Stomach/Bowel: Stomach, small-bowel, cecum normal. Appendix not identified. Colon rectosigmoid colon are normal. Vascular/Lymphatic: Abdominal aorta is normal caliber with atherosclerotic calcification. There is  no retroperitoneal or periportal lymphadenopathy. No pelvic  lymphadenopathy. Reproductive: Post hysterectomy anatomy Other: No free fluid. Musculoskeletal: No aggressive osseous lesion. IMPRESSION: 1. Enhancement of the epithelium of the LEFT and RIGHT renal pelves. Recommend clinical correlation for urinary tract infections. 2. No additional evidence of infection in the abdomen pelvis. 3.  Aortic Atherosclerosis (ICD10-I70.0). Electronically Signed   By: Suzy Bouchard M.D.   On: 08/26/2017 10:07   Dg Chest Port 1 View  Result Date: 08/26/2017 CLINICAL DATA:  Short of breath EXAM: PORTABLE CHEST 1 VIEW COMPARISON:  None. FINDINGS: The stable large cardiac silhouette. There is central venous pulmonary congestion. Mild peripheral interstitial edema. Vascular shadow projecting over the medial aspect of the RIGHT upper lobe. Airways normal. No pneumothorax. IMPRESSION: Central venous congestion and mild interstitial edema. Electronically Signed   By: Suzy Bouchard M.D.   On: 08/26/2017 11:13   Discharge Exam: Vitals:   08/28/17 2137 08/29/17 0500  BP: (!) 111/57 (!) 148/69  Pulse: 72 73  Resp: 18 16  Temp: 99.8 F (37.7 C) 99.6 F (37.6 C)  SpO2: 97% 97%   Vitals:   08/28/17 1443 08/28/17 1644 08/28/17 2137 08/29/17 0500  BP: (!) 141/55 (!) 143/67 (!) 111/57 (!) 148/69  Pulse: 71 73 72 73  Resp: 18  18 16   Temp: 98.4 F (36.9 C)  99.8 F (37.7 C) 99.6 F (37.6 C)  TempSrc: Oral  Oral Oral  SpO2: 98%  97% 97%  Weight:      Height:        General: Pt is alert, follows commands appropriately, not in acute distress Cardiovascular: Regular rate and rhythm, S1/S2 +, no murmurs, no rubs, no gallops Respiratory: Clear to auscultation bilaterally, no wheezing, no crackles, no rhonchi Abdominal: Soft, non tender, non distended, bowel sounds +, no guarding  Discharge Instructions  Discharge Instructions    Diet - low sodium heart healthy    Complete by:  As directed    Increase activity slowly    Complete by:  As directed      Allergies  as of 08/29/2017   No Known Allergies     Medication List    TAKE these medications   amLODipine 5 MG tablet Commonly known as:  NORVASC TAKE 1 TABLET (5 MG TOTAL) BY MOUTH DAILY.   amoxicillin 500 MG tablet Commonly known as:  AMOXIL Take 1 tablet (500 mg total) by mouth 2 (two) times daily.   BASAGLAR KWIKPEN 100 UNIT/ML Sopn Inject 0.75 mLs (75 Units total) into the skin at bedtime.   brimonidine 0.1 % Soln Commonly known as:  ALPHAGAN P Place 1 drop into both eyes 2 (two) times daily.   carvedilol 12.5 MG tablet Commonly known as:  COREG TAKE 1 TABLET BY MOUTH TWICE A DAY WITH A MEAL   dorzolamide 2 % ophthalmic solution Commonly known as:  TRUSOPT PLACE 1 DROP IN BOTH EYES TWICE A DAY   glucose blood test strip Commonly known as:  TRUETRACK TEST USE TO TEST BLOOD SUGAR TWICE A DAY AS DIRECTED (DX 250.02)   Insulin Pen Needle 31G X 5 MM Misc USE TO INJECT BASAGLAR ONCE DAILY   Insulin Syringe-Needle U-100 30G X 1/2" 1 ML Misc Commonly known as:  B-D INS SYR ULTRAFINE 1CC/30G INJECT 75 UNITS INTO THE SKIN DAILY.   JANUVIA 100 MG tablet Generic drug:  sitaGLIPtin TAKE 1 TABLET (100 MG TOTAL) BY MOUTH DAILY.   KLOR-CON M20 20 MEQ tablet Generic drug:  potassium chloride  SA TAKE 1 TABLET (20 MEQ TOTAL) BY MOUTH 2 (TWO) TIMES DAILY.   LUMIGAN 0.01 % Soln Generic drug:  bimatoprost PLACE 1 DROP INTO BOTH EYES AT BEDTIME.   omeprazole 20 MG capsule Commonly known as:  PRILOSEC TAKE 1 CAPSULE (20 MG TOTAL) BY MOUTH DAILY.   pioglitazone 15 MG tablet Commonly known as:  ACTOS TAKE 1 TABLET (15 MG TOTAL) BY MOUTH DAILY.   simvastatin 20 MG tablet Commonly known as:  ZOCOR TAKE 1 TABLET (20 MG TOTAL) BY MOUTH DAILY.   valsartan-hydrochlorothiazide 160-12.5 MG tablet Commonly known as:  DIOVAN-HCT TAKE 1 TABLET BY MOUTH DAILY.       Follow-up Information    Nafziger, Tommi Rumps, NP Follow up.   Specialty:  Family Medicine Contact information: [place] [id number] [phone]          The results of significant diagnostics from this hospitalization (including imaging, microbiology, ancillary and laboratory) are listed below for reference.    Microbiology: Recent Results (from the past 240 hour(s))  Urine culture     Status: Abnormal   Collection Time: 08/26/17  7:51 AM  Result Value Ref Range Status   Specimen Description URINE, CLEAN CATCH  Final   Special Requests NONE  Final   Culture >=100,000 COLONIES/mL ESCHERICHIA COLI (A)  Final   Report Status 08/29/2017 FINAL  Final   Organism ID, Bacteria ESCHERICHIA COLI (A)  Final      Susceptibility   Escherichia coli - MIC*    AMPICILLIN <=2 SENSITIVE Sensitive     CEFAZOLIN <=4 SENSITIVE Sensitive     CEFTRIAXONE <=1 SENSITIVE Sensitive     CIPROFLOXACIN <=0.25 SENSITIVE Sensitive     GENTAMICIN <=1 SENSITIVE Sensitive     IMIPENEM <=0.25 SENSITIVE Sensitive     NITROFURANTOIN <=16 SENSITIVE Sensitive     TRIMETH/SULFA <=20 SENSITIVE Sensitive     AMPICILLIN/SULBACTAM <=2 SENSITIVE Sensitive     PIP/TAZO <=4 SENSITIVE Sensitive     Extended ESBL NEGATIVE Sensitive     * >=100,000 COLONIES/mL ESCHERICHIA COLI  MRSA PCR Screening     Status: None   Collection Time: 08/26/17  1:18 PM  Result Value Ref Range Status   MRSA by PCR NEGATIVE NEGATIVE Final    Comment:        The GeneXpert MRSA Assay (FDA approved for NASAL specimens only), is one component of a comprehensive MRSA colonization surveillance program. It is not intended to diagnose MRSA infection nor to guide or monitor treatment for MRSA infections.   Culture, blood (routine x 2)     Status: None (Preliminary result)   Collection Time: 08/26/17  2:43 PM  Result Value Ref Range Status   Specimen Description BLOOD LEFT ANTECUBITAL  Final   Special Requests IN PEDIATRIC BOTTLE Blood Culture adequate volume  Final   Culture   Final    NO GROWTH 2 DAYS Performed at [facility],  [place] [address] [place],  [address]    Report Status PENDING  Incomplete  Culture, blood (routine x 2)     Status: None (Preliminary result)   Collection Time: 08/26/17  3:00 PM  Result Value Ref Range Status   Specimen Description BLOOD LEFT HAND  Final   Special Requests   Final    BOTTLES DRAWN AEROBIC ONLY Blood Culture adequate volume   Culture   Final    NO GROWTH 2 DAYS Performed at [facility], [place],  [place] [address]    Report Status PENDING  Incomplete     Labs: Basic Metabolic Panel:  Recent Labs Lab 08/26/17 0803 08/26/17 1455 08/27/17 0232 08/28/17 0419 08/29/17 0426  NA 137  --  137 142 139  K 3.6  --  3.2* 4.2 4.5  CL 104  --  104 111 107  CO2 25  --  23 24 25   GLUCOSE 94  --  118* 183* 151*  BUN 27*  --  26* 20 14  CREATININE 1.53*  --  1.24* 0.87 0.85  CALCIUM 8.5*  --  8.5* 8.6* 8.6*  MG  --  1.7 1.8  --  2.1   Liver Function Tests:  Recent Labs Lab 08/26/17 0803 08/27/17 0232  AST 26  --   ALT 27  --   ALKPHOS 75  --   BILITOT 0.7  --   PROT 6.8  --   ALBUMIN 3.0* 2.7*    Recent Labs Lab 08/26/17 0803  LIPASE 15   CBC:  Recent Labs Lab 08/26/17 0803 08/27/17 0232 08/28/17 0419 08/29/17 0426  WBC 14.3* 15.0* 13.7* 9.8  HGB 10.2* 10.1* 10.1* 10.5*  HCT 31.8* 30.9* 30.1* 32.2*  MCV 90.6 89.3 89.1 88.7  PLT 180 177 179 171   Cardiac Enzymes:  Recent Labs Lab 08/26/17 0803 08/26/17 1455 08/26/17 1940 08/27/17 0232  TROPONINI 0.58* 0.71* 0.49* 0.42*   BNP (last 3 results)  Recent Labs  08/26/17 1455  BNP 311.4*   CBG:  Recent Labs Lab 08/27/17 2250 08/28/17 0807 08/28/17 1158 08/28/17 1638 08/28/17 2140  GLUCAP 200* 121* 187* 184* 171*   SIGNED: Time coordinating discharge: 60 minutes  Faye Ramsay, MD  [facility] 08/29/2017, 10:06 AM Pager [phone]  If 7PM-7AM, please contact night-coverage [email or web address] Password [email or web address]

## 2017-08-29 NOTE — Care Management Important Message (Signed)
Important Message  Patient Details  Name: Lynn Swanson MRN: [id number] Date of Birth: [birth year]   Medicare Important Message Given:  Yes    Kerin Salen 08/29/2017, 11:31 AMImportant Message  Patient Details  Name: Lynn Swanson MRN: [id number] Date of Birth: [birth year]   Medicare Important Message Given:  Yes    Kerin Salen 08/29/2017, 11:30 AM

## 2017-08-31 LAB — CULTURE, BLOOD (ROUTINE X 2)
Culture: NO GROWTH
Culture: NO GROWTH
Special Requests: ADEQUATE
Special Requests: ADEQUATE

## 2017-09-01 ENCOUNTER — Telehealth: Payer: Self-pay | Admitting: *Deleted

## 2017-09-01 NOTE — Telephone Encounter (Signed)
Transition Care Management Follow-up Telephone Call  Call completed w/ patient's caregiver, Orbie Hurst (on [place]).  Per discharge summary: Admit date: 08/26/2017 Discharge date: 08/29/2017  Recommendations for Outpatient Follow-up:  1. Pt will need to follow up with PCP in 1-2 weeks post discharge 2. Please obtain BMP to evaluate electrolytes and kidney function 3. Please also check CBC to evaluate Hg and Hct levels 4. Pt to complete therapy with Amoxicillin for 7 more days post discharge   Discharge Diagnoses:  Active Problems:   Type 2 diabetes mellitus with complication, with long-term current use of insulin (HCC)   Essential hypertension, benign   Hypercholesteremia   Vomiting  Discharge Condition: Stable  Diet recommendation: Heart healthy diet discussed in details   --   How have you been since you were released from the hospital? "She had diarrhea. She's better today. She's eating. When she first came home, she didn't have an appetite and she was weak, but today she is eating."   Do you understand why you were in the hospital? No, discharge summary reviewed w/ caregiver   Do you understand the discharge instructions? yes   Where were you discharged to? Home   Items Reviewed:  Medications reviewed: yes  Allergies reviewed: yes  Dietary changes reviewed: yes  Referrals reviewed: yes, none made per patient   Functional Questionnaire:   Activities of Daily Living (ADLs):   She states they are independent in the following: ambulation, feeding and grooming. She is ambulating w/ walker. States they require assistance with the following: bathing and hygiene, continence, toileting and dressing   Any transportation issues/concerns?: no   Any patient concerns? yes, hospital recommended that patient be checked for "early dementia" and caregiver wondering if patient should see a neurologist. She also requested a copy of ED physician note and physician discharge  summary for patient's social worked. I printed these and placed on Misty's laptop.   Confirmed importance and date/time of follow-up visits scheduled yes  Provider Appointment booked with Dorothyann Peng, NP 09/02/2017 @ 11:00am  Confirmed with patient if condition begins to worsen call PCP or go to the ER.  Patient was given the office number and encouraged to call back with question or concerns.  : yes

## 2017-09-02 ENCOUNTER — Encounter: Payer: Self-pay | Admitting: Psychology

## 2017-09-02 ENCOUNTER — Other Ambulatory Visit: Payer: Self-pay | Admitting: Adult Health

## 2017-09-02 ENCOUNTER — Encounter: Payer: Self-pay | Admitting: Family Medicine

## 2017-09-02 ENCOUNTER — Encounter: Payer: Self-pay | Admitting: Adult Health

## 2017-09-02 ENCOUNTER — Ambulatory Visit (INDEPENDENT_AMBULATORY_CARE_PROVIDER_SITE_OTHER): Payer: Medicare Other | Admitting: Adult Health

## 2017-09-02 VITALS — BP 144/72 | Temp 99.2°F | Ht 62.0 in | Wt 191.0 lb

## 2017-09-02 DIAGNOSIS — Z23 Encounter for immunization: Secondary | ICD-10-CM | POA: Diagnosis not present

## 2017-09-02 DIAGNOSIS — N10 Acute tubulo-interstitial nephritis: Secondary | ICD-10-CM

## 2017-09-02 DIAGNOSIS — R778 Other specified abnormalities of plasma proteins: Secondary | ICD-10-CM

## 2017-09-02 DIAGNOSIS — R109 Unspecified abdominal pain: Secondary | ICD-10-CM | POA: Diagnosis not present

## 2017-09-02 DIAGNOSIS — R748 Abnormal levels of other serum enzymes: Secondary | ICD-10-CM

## 2017-09-02 DIAGNOSIS — R7989 Other specified abnormal findings of blood chemistry: Secondary | ICD-10-CM

## 2017-09-02 DIAGNOSIS — R4189 Other symptoms and signs involving cognitive functions and awareness: Secondary | ICD-10-CM | POA: Diagnosis not present

## 2017-09-02 LAB — BASIC METABOLIC PANEL
BUN: 15 mg/dL (ref 6–23)
CHLORIDE: 104 meq/L (ref 96–112)
CO2: 29 meq/L (ref 19–32)
CREATININE: 0.78 mg/dL (ref 0.40–1.20)
Calcium: 8.8 mg/dL (ref 8.4–10.5)
GFR: 92.01 mL/min (ref >60.00)
Glucose, Bld: 191 mg/dL — ABNORMAL HIGH (ref 70–99)
Potassium: 4.6 mEq/L (ref 3.5–5.1)
SODIUM: 140 meq/L (ref 135–145)

## 2017-09-02 LAB — CBC WITH DIFFERENTIAL/PLATELET
BASOS ABS: 0 10*3/uL (ref 0.0–0.1)
Basophils Relative: 0.5 % (ref 0.0–3.0)
Eosinophils Absolute: 0.1 10*3/uL (ref 0.0–0.7)
Eosinophils Relative: 0.8 % (ref 0.0–5.0)
HCT: 32.6 % — ABNORMAL LOW (ref 36.0–46.0)
Hemoglobin: 10.4 g/dL — ABNORMAL LOW (ref 12.0–15.0)
LYMPHS ABS: 1.4 10*3/uL (ref 0.7–4.0)
Lymphocytes Relative: 21 % (ref 12.0–46.0)
MCHC: 31.8 g/dL (ref 30.0–36.0)
MCV: 90.4 fl (ref 78.0–100.0)
MONOS PCT: 11.8 % (ref 3.0–12.0)
Monocytes Absolute: 0.8 10*3/uL (ref 0.1–1.0)
NEUTROS PCT: 65.9 % (ref 43.0–77.0)
Neutro Abs: 4.5 10*3/uL (ref 1.4–7.7)
Platelets: 323 10*3/uL (ref 150.0–400.0)
RBC: 3.61 Mil/uL — ABNORMAL LOW (ref 3.87–5.11)
RDW: 14.8 % (ref 11.5–15.5)
WBC: 6.8 10*3/uL (ref 4.0–10.5)

## 2017-09-02 NOTE — Addendum Note (Signed)
Addended by: Miles Costain T on: 09/02/2017 02:31 PM   Modules accepted: Orders

## 2017-09-02 NOTE — Progress Notes (Signed)
Subjective:    Patient ID: Lynn Swanson, female    DOB: July 03, 1941, 76 y.o.   MRN: [id number]  HPI  76 year old female who  has a past medical history of Diabetes mellitus; Glaucoma; HSV (herpes simplex virus) infection; and Hypertension. She presents to the office today for TCM visit.   She was admitted on 08/27/2015 She was discharged on 08/29/2017   She had presented to the ER with nausea, vomiting, generalized abdominal pain with dysuria for two days. In the ER she was found to have imaging findings c/w pyleonehpritis as well as elevated troponin, that was felt consistent with demand ischemia.   Hospital Course   1. Sepsis secondary to UTI. CT images with evidence of pyelo and UA with many bacteria, WBC, and leuks. She had a fever while in the hospital. White count up to 15, trending down upon discharge. She was given 7 day course of Amoxicillin upon discharge.   2. Elevated Troponin. Felt to be demand ischemia. Troponin peaked at 0.71, and was down trending on discharge. EKG with non specific ST depression. CXR with mild edema, central venous congestion.   Echo - normal systolic function, mild LVH, diastolic dysfunction, mild MR, moderate RAE, mild TR with mildly elevated pulm pressure. Cardiology was consulted and recommended outpatient follow up for stress test. This has not been established yet   Today in the office she reports that she is feeling better but is "not back to normal". She continues to have some episodes of abdominal pain and " just not feeling well." Her daughter, who is with her during this appointment is concerned about dementia. She feels as though her mother has had a slow steady cognitive decline. Daughter reports that Brittyn will often mix up family members names. Daughter would like Bevan to see someone about this     Review of Systems See HPI   Past Medical History:  Diagnosis Date  . Diabetes mellitus   . Glaucoma   . HSV (herpes simplex virus)  infection   . Hypertension     Social History   Social History  . Marital status: Widowed    Spouse name: N/A  . Number of children: N/A  . Years of education: N/A   Occupational History  . Not on file.   Social History Main Topics  . Smoking status: Never Smoker  . Smokeless tobacco: Former Systems developer    Types: Snuff  . Alcohol use No  . Drug use: No  . Sexual activity: Not on file   Other Topics Concern  . Not on file   Social History Narrative   Retired    Married, husband deceased in Feb 07, 2003 with kidney failure   -Three daughters, one lives in [place], one in [place], one [place]. Three sons, one is [place], one is in [place], one passes away.    Lives with daughter   No pets.    Likes to walk and go to the park.           Past Surgical History:  Procedure Laterality Date  . ABDOMINAL HYSTERECTOMY      Family History  Problem Relation Age of Onset  . Diabetes Mother     No Known Allergies  Current Outpatient Prescriptions on File Prior to Visit  Medication Sig Dispense Refill  . amLODipine (NORVASC) 5 MG tablet TAKE 1 TABLET (5 MG TOTAL) BY MOUTH DAILY. 90 tablet 3  . amoxicillin (AMOXIL) 500 MG tablet Take 1 tablet (500 mg total)  by mouth 2 (two) times daily. 14 tablet 0  . brimonidine (ALPHAGAN P) 0.1 % SOLN Place 1 drop into both eyes 2 (two) times daily. 15 mL 0  . carvedilol (COREG) 12.5 MG tablet TAKE 1 TABLET BY MOUTH TWICE A DAY WITH A MEAL 180 tablet 3  . dorzolamide (TRUSOPT) 2 % ophthalmic solution PLACE 1 DROP IN BOTH EYES TWICE A DAY 10 mL 1  . glucose blood (TRUETRACK TEST) test strip USE TO TEST BLOOD SUGAR TWICE A DAY AS DIRECTED (DX 250.02) 180 each 3  . Insulin Glargine (BASAGLAR KWIKPEN) 100 UNIT/ML SOPN Inject 0.75 mLs (75 Units total) into the skin at bedtime. 3 mL 11  . Insulin Pen Needle 31G X 5 MM MISC USE TO INJECT BASAGLAR ONCE DAILY 100 each 3  . Insulin Syringe-Needle U-100 (B-D INS SYR ULTRAFINE 1CC/30G) 30G X 1/2" 1 ML MISC  INJECT 75 UNITS INTO THE SKIN DAILY. 100 each 3  . JANUVIA 100 MG tablet TAKE 1 TABLET (100 MG TOTAL) BY MOUTH DAILY. 90 tablet 3  . KLOR-CON M20 20 MEQ tablet TAKE 1 TABLET (20 MEQ TOTAL) BY MOUTH 2 (TWO) TIMES DAILY. 180 tablet 3  . LUMIGAN 0.01 % SOLN PLACE 1 DROP INTO BOTH EYES AT BEDTIME. 7.5 mL 3  . omeprazole (PRILOSEC) 20 MG capsule TAKE 1 CAPSULE (20 MG TOTAL) BY MOUTH DAILY. 90 capsule 3  . pioglitazone (ACTOS) 15 MG tablet TAKE 1 TABLET (15 MG TOTAL) BY MOUTH DAILY. 90 tablet 3  . simvastatin (ZOCOR) 20 MG tablet TAKE 1 TABLET (20 MG TOTAL) BY MOUTH DAILY. 90 tablet 3  . valsartan-hydrochlorothiazide (DIOVAN-HCT) 160-12.5 MG tablet TAKE 1 TABLET BY MOUTH DAILY. 90 tablet 3   No current facility-administered medications on file prior to visit.     BP (!) 144/72 (BP Location: Left Arm)   Temp 99.2 F (37.3 C) (Oral)   Ht 5\' 2"  (1.575 m)   Wt 191 lb (86.6 kg)   BMI 34.93 kg/m       Objective:   Physical Exam  Constitutional: She is oriented to person, place, and time. She appears well-developed and well-nourished. No distress.  Cardiovascular: Normal rate, regular rhythm and intact distal pulses.  Exam reveals no gallop and no friction rub.   No murmur heard. Pulmonary/Chest: Effort normal and breath sounds normal. No respiratory distress. She has no wheezes. She has no rales. She exhibits no tenderness.  Abdominal: Soft. Bowel sounds are normal. She exhibits no distension and no mass. There is no tenderness. There is no rebound, no guarding and no CVA tenderness.  Neurological: She is alert and oriented to person, place, and time.  Skin: Skin is warm and dry. No rash noted. She is not diaphoretic. No erythema. No pallor.  Psychiatric: She has a normal mood and affect. Her behavior is normal. Judgment and thought content normal.  Vitals reviewed.     Assessment & Plan:  1. Cognitive impairment MMSE - score of 18.  - She had issues with Reading, writing, copying, and  delayed verbal recall. Will refer to neuro psych for further testing  - CBC with Differential/Platelet - Basic Metabolic Panel - Ambulatory referral to Neurology  2. Acute pyelonephritis - Continue abx until finished  - Stay hydrated  - Follow up as needed  3. Elevated troponin  - Ambulatory referral to Cardiology  Dorothyann Peng, NP

## 2017-09-02 NOTE — Patient Instructions (Addendum)
It was great seeing you today   I will follow up with you regarding your blood work   Someone from Neurology and Cardiology will call you to schedule your exam

## 2017-09-19 NOTE — Progress Notes (Signed)
Cardiology Office Note   Date:  09/22/2017   ID:  Lynn Swanson, Lynn Swanson [birth year], MRN [id number]  PCP:  Dorothyann Peng, NP  Cardiologist:   Jenkins Rouge, MD   No chief complaint on file.     History of Present Illness: Lynn Swanson is a 76 y.o. female who presents for consultation regarding elevated troponin. Referred by Dr Mart Piggs after recent hospitalization. CRF;s include HTN and insulin dependant DM. D/C from hospital 08/29/17. No chest pain. Had  Nausea, vomiting dysuria and abdominal pain. Imaging suggested pyelonephritis Urine with Ecoli Rx with Amoxicillin. Not clear why but troponin checked and elevated at .58, .71, .49, .42. Echo done 08/27/17 reviewed ECG non acute Case discussed with Dr Harrington Challenger who suggested outpatient w/u for CAD   EF 55-60% mild LVH grade one diastolic Mild MR atrial septal aneurysm  Estimated PA 49 mmHg  On f/u 10/30 with primary daughter expressed concern for dementia and Dr Carlisle Cater has referred her to neurology   Past Medical History:  Diagnosis Date  . Diabetes mellitus   . Glaucoma   . HSV (herpes simplex virus) infection   . Hypertension     Past Surgical History:  Procedure Laterality Date  . ABDOMINAL HYSTERECTOMY       Current Outpatient Medications  Medication Sig Dispense Refill  . amLODipine (NORVASC) 5 MG tablet TAKE 1 TABLET (5 MG TOTAL) BY MOUTH DAILY. 90 tablet 3  . brimonidine (ALPHAGAN P) 0.1 % SOLN Place 1 drop into both eyes 2 (two) times daily. 15 mL 0  . carvedilol (COREG) 12.5 MG tablet TAKE 1 TABLET BY MOUTH TWICE A DAY WITH A MEAL 180 tablet 3  . dorzolamide (TRUSOPT) 2 % ophthalmic solution PLACE 1 DROP IN BOTH EYES TWICE A DAY 10 mL 1  . glucose blood (TRUETRACK TEST) test strip USE TO TEST BLOOD SUGAR TWICE A DAY AS DIRECTED (DX 250.02) 180 each 3  . Insulin Glargine (BASAGLAR KWIKPEN) 100 UNIT/ML SOPN Inject 0.75 mLs (75 Units total) into the skin at bedtime. 3 mL 11  . Insulin Pen Needle 31G X 5 MM MISC  USE TO INJECT BASAGLAR ONCE DAILY 100 each 3  . Insulin Syringe-Needle U-100 (B-D INS SYR ULTRAFINE 1CC/30G) 30G X 1/2" 1 ML MISC INJECT 75 UNITS INTO THE SKIN DAILY. 100 each 3  . JANUVIA 100 MG tablet TAKE 1 TABLET (100 MG TOTAL) BY MOUTH DAILY. 90 tablet 3  . KLOR-CON M20 20 MEQ tablet TAKE 1 TABLET (20 MEQ TOTAL) BY MOUTH 2 (TWO) TIMES DAILY. 180 tablet 3  . LUMIGAN 0.01 % SOLN PLACE 1 DROP INTO BOTH EYES AT BEDTIME. 7.5 mL 3  . omeprazole (PRILOSEC) 20 MG capsule TAKE 1 CAPSULE (20 MG TOTAL) BY MOUTH DAILY. 90 capsule 3  . pioglitazone (ACTOS) 15 MG tablet TAKE 1 TABLET (15 MG TOTAL) BY MOUTH DAILY. 90 tablet 3  . simvastatin (ZOCOR) 20 MG tablet TAKE 1 TABLET (20 MG TOTAL) BY MOUTH DAILY. 90 tablet 3  . valsartan-hydrochlorothiazide (DIOVAN-HCT) 160-12.5 MG tablet TAKE 1 TABLET BY MOUTH DAILY. 90 tablet 3   No current facility-administered medications for this visit.     Allergies:   Patient has no known allergies.    Social History:  The patient  reports that  has never smoked. She has quit using smokeless tobacco. Her smokeless tobacco use included snuff. She reports that she does not drink alcohol or use drugs.   Family History:  The patient's family history includes Diabetes  in her mother.    ROS:  Please see the history of present illness.   Otherwise, review of systems are positive for none.   All other systems are reviewed and negative.    PHYSICAL EXAM: VS:  BP 132/72   Pulse 68   Wt 189 lb (85.7 kg)   SpO2 99%   BMI 34.57 kg/m  , BMI Body mass index is 34.57 kg/m. Affect appropriate Healthy:  appears stated age 3: normal Neck supple with no adenopathy JVP normal no bruits no thyromegaly Lungs clear with no wheezing and good diaphragmatic motion Heart:  S1/S2 no murmur, no rub, gallop or click PMI normal Abdomen: benighn, BS positve, no tenderness, no AAA no bruit.  No HSM or HJR Distal pulses intact with no bruits No edema Neuro non-focal sways back and  forth significant dementia Skin warm and dry No muscular weakness    EKG: ST PVC nonspecific ST changes    Recent Labs: 08/26/2017: ALT 27; B Natriuretic Peptide 311.4; TSH 1.336 08/29/2017: Magnesium 2.1 09/02/2017: BUN 15; Creatinine, Ser 0.78; Hemoglobin 10.4; Platelets 323.0; Potassium 4.6; Sodium 140    Lipid Panel    Component Value Date/Time   CHOL 179 02/04/2017 0809   TRIG 85.0 02/04/2017 0809   HDL 39.40 02/04/2017 0809   CHOLHDL 5 02/04/2017 0809   VLDL 17.0 02/04/2017 0809   LDLCALC 123 (H) 02/04/2017 0809      Wt Readings from Last 3 Encounters:  09/22/17 189 lb (85.7 kg)  09/02/17 191 lb (86.6 kg)  08/27/17 195 lb 5.2 oz (88.6 kg)      Other studies Reviewed: Additional studies/ records that were reviewed today include: Notes recent hospitalization , labs , CXR Echo and ECGls .    ASSESSMENT AND PLAN:  1.  Elevated Troponin not significant she has no symptoms and given age and dementia would not work up further   2. HTN Well controlled.  Continue current medications and low sodium Dash type diet.    3. DM Discussed low carb diet.  Target hemoglobin A1c is 6.5 or less.  Continue current medications.  4. Pylonephritis resolved f/u UA primary afibrile  5. Cognitive Impairment marked has aid at home to see neurology next month    Current medicines are reviewed at length with the patient today.  The patient does not have concerns regarding medicines.  The following changes have been made:  no change  Labs/ tests ordered today include: Lexiscan Myovue  No orders of the defined types were placed in this encounter.    Disposition:   FU with cardiology PRN      Signed, Jenkins Rouge, MD  09/22/2017 10:05 AM    [address], [place], [place]  [address] Phone: [phone]; Fax: [phone]

## 2017-09-22 ENCOUNTER — Ambulatory Visit (INDEPENDENT_AMBULATORY_CARE_PROVIDER_SITE_OTHER): Payer: Medicare Other | Admitting: Cardiovascular Disease

## 2017-09-22 ENCOUNTER — Encounter: Payer: Self-pay | Admitting: Cardiovascular Disease

## 2017-09-22 VITALS — BP 132/72 | HR 68 | Wt 189.0 lb

## 2017-09-22 DIAGNOSIS — R748 Abnormal levels of other serum enzymes: Secondary | ICD-10-CM

## 2017-09-22 DIAGNOSIS — L603 Nail dystrophy: Secondary | ICD-10-CM | POA: Diagnosis not present

## 2017-09-22 DIAGNOSIS — R778 Other specified abnormalities of plasma proteins: Secondary | ICD-10-CM

## 2017-09-22 DIAGNOSIS — L84 Corns and callosities: Secondary | ICD-10-CM | POA: Diagnosis not present

## 2017-09-22 DIAGNOSIS — R7989 Other specified abnormal findings of blood chemistry: Principal | ICD-10-CM

## 2017-09-22 DIAGNOSIS — I739 Peripheral vascular disease, unspecified: Secondary | ICD-10-CM | POA: Diagnosis not present

## 2017-09-22 DIAGNOSIS — E1151 Type 2 diabetes mellitus with diabetic peripheral angiopathy without gangrene: Secondary | ICD-10-CM | POA: Diagnosis not present

## 2017-09-22 NOTE — Patient Instructions (Signed)
Medication Instructions:  Your physician recommends that you continue on your current medications as directed. Please refer to the Current Medication list given to you today.  Labwork: NONE  Testing/Procedures: NONE  Follow-Up: Your physician wants you to follow-up as needed with  Dr. Nishan.    If you need a refill on your cardiac medications before your next appointment, please call your pharmacy.    

## 2017-10-25 ENCOUNTER — Other Ambulatory Visit: Payer: Self-pay | Admitting: Adult Health

## 2017-10-25 ENCOUNTER — Other Ambulatory Visit: Payer: Self-pay | Admitting: Family Medicine

## 2017-10-25 MED ORDER — BASAGLAR KWIKPEN 100 UNIT/ML ~~LOC~~ SOPN: 75.0000 [IU] | PEN_INJECTOR | Freq: Every day | SUBCUTANEOUS | 11 refills | Status: DC

## 2017-10-30 NOTE — Telephone Encounter (Signed)
DUPLICATE REQUEST.  SENT IN ON 10/25/2017.

## 2017-11-06 ENCOUNTER — Other Ambulatory Visit: Payer: Self-pay | Admitting: Adult Health

## 2017-11-06 DIAGNOSIS — IMO0001 Reserved for inherently not codable concepts without codable children: Secondary | ICD-10-CM

## 2017-11-06 DIAGNOSIS — Z794 Long term (current) use of insulin: Secondary | ICD-10-CM

## 2017-11-06 DIAGNOSIS — Z760 Encounter for issue of repeat prescription: Secondary | ICD-10-CM

## 2017-11-06 DIAGNOSIS — E1165 Type 2 diabetes mellitus with hyperglycemia: Secondary | ICD-10-CM

## 2017-11-06 NOTE — Telephone Encounter (Signed)
Per Tommi Rumps, all medications except diabetes meds can be filled for 1 year.  Diabetes meds should be filled for 3 months.  Sent to the pharmacy by e-scribe.

## 2017-11-06 NOTE — Telephone Encounter (Signed)
Ok to refill all medications

## 2017-11-10 ENCOUNTER — Ambulatory Visit (INDEPENDENT_AMBULATORY_CARE_PROVIDER_SITE_OTHER): Payer: Medicare Other | Admitting: Psychology

## 2017-11-10 ENCOUNTER — Encounter: Payer: Self-pay | Admitting: Psychology

## 2017-11-10 DIAGNOSIS — F0150 Vascular dementia without behavioral disturbance: Secondary | ICD-10-CM

## 2017-11-10 DIAGNOSIS — R413 Other amnesia: Secondary | ICD-10-CM

## 2017-11-10 NOTE — Progress Notes (Signed)
   Neuropsychology Note  Teodora Baumgarten completed 60 minutes of neuropsychological testing with technician, Milana Kidney, BS, under the supervision of Dr. Macarthur Critchley, Licensed Psychologist. The patient did not appear overtly distressed by the testing session, per behavioral observation or via self-report to the technician. Rest breaks were offered.   Communication between the psychologist and technician was ongoing throughout the testing session and changes were made as deemed necessary based on patient performance on testing, technician observations and additional pertinent factors (e.g., current level of functioning, level of presumed impairment, nature of symptoms, emotional and behavioral responses during the status exam/testing, level of literacy, estimated premorbid baseline intellectual abilities, and observed level of motivation/effort).  Gabriellia Rempel will return within 2 weeks for a feedback session with Dr. Si Raider at which time her test performances, clinical impressions and treatment recommendations will be reviewed in detail. The patient understands she can contact our office should she require our assistance before this time.  60 minutes spent face-to-face with patient administering standardized tests Environmental education officer). 30 minutes spent scoring Environmental education officer).  Full report to follow.

## 2017-11-10 NOTE — Progress Notes (Signed)
NEUROBEHAVIORAL STATUS EXAM (CPT: [id number])  Name: Lynn Swanson Date of Birth: [birth year] Date of Interview: 11/10/2017  Reason for Referral:  Lynn Swanson is a 77 y.o. female who is referred for neuropsychological evaluation by Dorothyann Peng of [facility] due to concerns about memory loss and possible dementia. This patient is accompanied in the office by her daughter who supplements the history.  History of Presenting Problem:  Ms. Texeira reported that she "sometimes" has memory problems but she was unable to provide much detail. She admits she has some trouble keeping track of the day or date. Her daughter reported significant short term memory loss with frequent forgetting of recent conversations and events as well as repetition of questions/statements. The patient has lived with her daughter since 2013, and her daughter has noticed cognitive decline over that period of time. The patient has been going to a senior center five days a week for 4 years. They also have an aide who comes in to provide bathing/dressing. The patient has incontinence and wears pull-ups. Her daughter manages all complex ADLs (finances, meals, appointments, medications). Her daughter administers the patient's medication daily. The patient states she has never driven.   Her daughter has not observed any acute confusional states suggestive of delirium. The patient does demonstrate neuropsychiatric features including suspiciousness (worrying that someone is coming in their home and taking their things) and possible hallucinations. She sometimes mixes up family members, thinking her daughter is her granddaughter, etc. Her mood is more depressed with irritability. Her daughter states she is more "angry, bitter and feisty". Her appetite is good. Most of the time she sleeps well. She does get tired easily during the day. She dozes off even though she does not want to. She is able to walk independently and has not had any  falls recently. She has a cane she occasionally uses.   The patient has never sought mental health care or seen a psychiatry provider. Her daughter stated she has never been diagnosed with a mental health condition but it is possible she has some mental health history that was never addressed.   The patient was last seen by Dorothyann Peng, NP, on 09/02/2017. MMSE was 18/30.    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 She had 3 daughters and 3 sons.  Alcohol: None Tobacco: Dipped snuff in the past but has not used in several years   Medical History: Past Medical History:  Diagnosis Date  . Diabetes mellitus   . Glaucoma   . HSV (herpes simplex virus) infection   . Hypertension      Current Medications:  Outpatient Encounter Medications as of 11/10/2017  Medication Sig  . amLODipine (NORVASC) 5 MG tablet TAKE 1 TABLET (5 MG TOTAL) BY MOUTH DAILY.  . brimonidine (ALPHAGAN P) 0.1 % SOLN Place 1 drop into both eyes 2 (two) times daily.  . carvedilol (COREG) 12.5 MG tablet TAKE 1 TABLET BY MOUTH TWICE A DAY WITH A MEAL  . dorzolamide (TRUSOPT) 2 % ophthalmic solution PLACE 1 DROP IN BOTH EYES TWICE A DAY  . glucose blood (TRUETRACK TEST) test strip USE TO TEST BLOOD SUGAR TWICE A DAY AS DIRECTED (DX 250.02)  . Insulin Glargine (BASAGLAR KWIKPEN) 100 UNIT/ML SOPN Inject 0.75 mLs (75 Units total) into the skin at bedtime.  . Insulin Pen Needle 31G X 5 MM MISC USE TO INJECT BASAGLAR ONCE  DAILY  . Insulin Syringe-Needle U-100 (B-D INS SYR ULTRAFINE 1CC/30G) 30G X 1/2" 1 ML MISC INJECT 75 UNITS INTO THE SKIN DAILY.  Marland Kitchen JANUVIA 100 MG tablet TAKE 1 TABLET (100 MG TOTAL) BY MOUTH DAILY.  Marland Kitchen KLOR-CON M20 20 MEQ tablet TAKE 1 TABLET (20 MEQ TOTAL) BY MOUTH 2 (TWO) TIMES DAILY.  Marland Kitchen LUMIGAN 0.01 % SOLN PLACE 1 DROP INTO BOTH EYES AT BEDTIME.  Marland Kitchen omeprazole (PRILOSEC) 20 MG capsule  TAKE 1 CAPSULE (20 MG TOTAL) BY MOUTH DAILY.  Marland Kitchen pioglitazone (ACTOS) 15 MG tablet TAKE 1 TABLET (15 MG TOTAL) BY MOUTH DAILY.  . simvastatin (ZOCOR) 20 MG tablet TAKE 1 TABLET (20 MG TOTAL) BY MOUTH DAILY.  . valsartan-hydrochlorothiazide (DIOVAN-HCT) 160-12.5 MG tablet TAKE 1 TABLET BY MOUTH DAILY.   No facility-administered encounter medications on file as of 11/10/2017.      Behavioral Observations:   Appearance: Casually and appropriately dressed and groomed Gait: Ambulated independently and slowly Speech: Fluent; somewhat sparse speech; mildly dysarthric Thought process: Appears linear Affect: Blunted, euthymic Interpersonal: Pleasant, compliant, appropriate   TESTING: There is medical necessity to proceed with neuropsychological assessment as the results will be used to aid in differential diagnosis and clinical decision-making and to inform specific treatment recommendations. Per the patient, her daughter and medical records reviewed, there has been a change in cognitive functioning and a reasonable suspicion of dementia (most likely AD versus vascular dementia or mixed dementia).  Clinical Decision Making: In considering the patient's current level of functioning, level of presumed impairment, nature of symptoms, emotional and behavioral responses during the interview, level of literacy, and observed level of motivation, a battery of tests was selected and communicated to the psychometrician.   Following the clinical interview/neurobehavioral status exam, the patient completed this full battery of neuropsychological testing with my psychometrician under my supervision (see separate note).   PLAN: The patient will return to see me for a follow-up session at which time her test performances and my impressions and treatment recommendations will be reviewed in detail.  Evaluation ongoing; full report to follow.

## 2017-11-13 ENCOUNTER — Other Ambulatory Visit: Payer: Self-pay | Admitting: Adult Health

## 2017-11-13 DIAGNOSIS — Z760 Encounter for issue of repeat prescription: Secondary | ICD-10-CM

## 2017-11-13 NOTE — Telephone Encounter (Signed)
Sent to the pharmacy by e-scribe for 90 days. 

## 2017-11-23 NOTE — Progress Notes (Signed)
NEUROPSYCHOLOGICAL EVALUATION   Name:    Lynn Swanson  Date of Birth:   11/18/1940 Date of Interview:  11/10/2017 Date of Testing:  11/10/2017   Date of Feedback:  11/24/2017       Background Information:  Reason for Referral:  Lynn Swanson is a 77 y.o. female referred by Dorothyann Peng, NP, to assess her current level of cognitive functioning and assist in differential diagnosis. The current evaluation consisted of a review of available medical records, an interview with the patient and her daughter, and the completion of a neuropsychological testing battery. Informed consent was obtained.  History of Presenting Problem:  Lynn Swanson reported that she "sometimes" has memory problems but she was unable to provide much detail. She admits she has some trouble keeping track of the day or date. Her daughter reported significant short term memory loss with frequent forgetting of recent conversations and events as well as repetition of questions/statements. The patient has lived with her daughter since 2013, and her daughter has noticed cognitive decline over that period of time. The patient has been going to a senior center five days a week for 4 years. They also have an aide who comes in to provide bathing/dressing. The patient has incontinence and wears pull-ups. Her daughter manages all complex ADLs (finances, meals, appointments, medications). Her daughter administers the patient's medication daily. The patient states she has never driven.   Her daughter has not observed any acute confusional states suggestive of delirium. The patient does demonstrate neuropsychiatric features including suspiciousness (worrying that someone is coming in their home and taking their things) and possible hallucinations. She sometimes mixes up family members, thinking her daughter is her granddaughter, etc. Her mood is more depressed with irritability. Her daughter states she is more "angry, bitter and feisty". Her  appetite is good. Most of the time she sleeps well. She does get tired easily during the day. She dozes off even though she does not want to. She is able to walk independently and has not had any falls recently. She has a cane she occasionally uses.   The patient has never sought mental health care or seen a psychiatry provider. Her daughter stated she has never been diagnosed with a mental health condition but it is possible she has some mental health history that was never addressed.   The patient was last seen by Dorothyann Peng, NP, on 09/02/2017. MMSE was 18/30.    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 She had 3 daughters and 3 sons.  Alcohol: None Tobacco: Dipped snuff in the past but has not used in several years   Medical History:  Past Medical History:  Diagnosis Date  . Diabetes mellitus   . Glaucoma   . HSV (herpes simplex virus) infection   . Hypertension     Current medications:  Outpatient Encounter Medications as of 11/25/2017  Medication Sig  . amLODipine (NORVASC) 5 MG tablet TAKE 1 TABLET (5 MG TOTAL) BY MOUTH DAILY.  . brimonidine (ALPHAGAN P) 0.1 % SOLN Place 1 drop into both eyes 2 (two) times daily.  . carvedilol (COREG) 12.5 MG tablet TAKE 1 TABLET BY MOUTH TWICE A DAY WITH A MEAL  . dorzolamide (TRUSOPT) 2 % ophthalmic solution PLACE 1 DROP IN BOTH EYES TWICE A DAY  . glucose blood (TRUETRACK TEST) test strip USE TO TEST BLOOD SUGAR TWICE A DAY AS  DIRECTED (DX 250.02)  . Insulin Glargine (BASAGLAR KWIKPEN) 100 UNIT/ML SOPN Inject 0.75 mLs (75 Units total) into the skin at bedtime.  . Insulin Pen Needle 31G X 5 MM MISC USE TO INJECT BASAGLAR ONCE DAILY  . Insulin Syringe-Needle U-100 (B-D INS SYR ULTRAFINE 1CC/30G) 30G X 1/2" 1 ML MISC INJECT 75 UNITS INTO THE SKIN DAILY.  Marland Kitchen JANUVIA 100 MG tablet TAKE 1 TABLET (100 MG TOTAL) BY MOUTH  DAILY.  Marland Kitchen KLOR-CON M20 20 MEQ tablet TAKE 1 TABLET (20 MEQ TOTAL) BY MOUTH 2 (TWO) TIMES DAILY.  Marland Kitchen LUMIGAN 0.01 % SOLN PLACE 1 DROP INTO BOTH EYES AT BEDTIME.  Marland Kitchen omeprazole (PRILOSEC) 20 MG capsule TAKE 1 CAPSULE (20 MG TOTAL) BY MOUTH DAILY.  Marland Kitchen pioglitazone (ACTOS) 15 MG tablet TAKE 1 TABLET (15 MG TOTAL) BY MOUTH DAILY.  . simvastatin (ZOCOR) 20 MG tablet TAKE 1 TABLET (20 MG TOTAL) BY MOUTH DAILY.  . valsartan-hydrochlorothiazide (DIOVAN-HCT) 160-12.5 MG tablet TAKE 1 TABLET BY MOUTH DAILY.   No facility-administered encounter medications on file as of 11/25/2017.      Current Examination:  Behavioral Observations:  Appearance: Casually and appropriately dressed and groomed Gait: Ambulated independently and slowly Speech: Fluent; somewhat sparse speech; mildly dysarthric Thought process: Appears linear Affect: Blunted, euthymic Interpersonal: Pleasant, compliant, appropriate Orientation: Oriented to all spheres but could not name the current President nor his immediate predecessor.  Tests Administered: . Test of Premorbid Functioning (TOPF) . Dementia Rating Scale - Second Edition (DRS-2) . Wechsler Adult Intelligence Scale-Fourth Edition (WAIS-IV): Coding subtest . [place] Verbal Learning Test - 2nd Edition (CVLT-2) Short Form . Neuropsychological Assessment Battery (NAB) Language Module, Form 1: Naming subtest . Controlled Oral Word Association Test (COWAT) . Trail Making Test A and B . Clock drawing test . Geriatric Depression Scale (GDS) 15 Item . Generalized Anxiety Disorder - 7 item screener (GAD-7)  Test Results: Note: Standardized scores are presented only for use by appropriately trained professionals and to allow for any future test-retest comparison. These scores should not be interpreted without consideration of all the information that is contained in the rest of the report. The most recent standardization samples from the test publisher or other sources were  used whenever possible to derive standard scores; scores were corrected for age, gender, ethnicity and education when available.   Test Scores:  Test Name Raw Score Standardized Score Descriptor  TOPF 12/70 SS= 74 Borderline  DRS-2     Attention  ss= 8 Low end of average  Initiation/Perseveration  ss= 5 Borderline  Construction  ss= 5 Borderline  Conceptualization  ss= 9 Average  Memory  ss= 7 Low average  Total Score  ss= 5 Borderline  Total Score - Education Adjusted (AEMSS)  ss= 5 Borderline  WAIS-IV Subtest     Coding Pt refused    CVLT-II Scores     Trial 1 1/9 Z= -3 Severely impaired  Trial 4 7/9 Z= -0.5 Average  Trials 1-4 total 17/36 T= 30 Impaired  SD Free Recall 6/9 Z= -0.5 Average  LD Free Recall 5/9 Z= -0.5 Average  LD Cued Recall 2/9 Z= -2.5 Impaired  Recognition Discriminability 6/9 hits, 2 false positives Z= -1.5 Borderline  Forced Choice Recognition 9/9  WNL  NAB Naming 15/31 T= 19 Severely impaired  COWAT-FAS 11 T= 30 Impaired  COWAT-Animals 10 T= 35 Borderline  Trail Making Test A   280" 0 errors T= <20 Severely impaired  Trail Making Test B  Pt unable  Clock Drawing   Impaired  GDS-15 3/15  WNL  GAD-7 3/21  WNL      Description of Test Results:  Premorbid verbal intellectual abilities were estimated to have been within the borderline range based on a test of word reading. On a measure of general cognitive functioning and screening for dementia (DRS-2), the patient performed with in the borderline impaired range, and her score was right at the cutoff typically used for dementia. On the DRS-2, areas of relative strength were conceptualization or verbal abstract reasoning (average) and basic attention (also average). Her performance on the memory index of the DRS-2 was low average. Visual-spatial construction was borderline impaired as was initiation/perseveration.   Psychomotor processing speed was severely impaired on Trails A. As noted above, basic  visual and auditory attention were intact. Basic visual-spatial construction was borderline impaired. Language abilities were below expectation. Specifically, confrontation naming was severely impaired, and semantic verbal fluency was borderline impaired. With regard to verbal memory, encoding and acquisition of non-contextual information (i.e., word list) was impaired. After a brief distracter task, free recall was average (6/9 items recalled). After a delay, free recall was average (5/9 items recalled). She did not benefit from semantic cueing. Performance on a yes/no recognition task was borderline impaired. Executive functioning was variable. Mental flexibility and set-shifting were severely impaired; she was unable to complete Trails B. Verbal fluency with phonemic search restrictions was impaired. As noted previously, basic verbal abstract reasoning was intact. Performance on a clock drawing task was impaired.   On self-report measures of mood, the patient's responses were not indicative of clinically significant depression/anxiety at the present time.    Clinical Impressions: Mild dementia (possible vascular dementia). Performances on cognitive testing were overall better than expected, based on daily functioning and level of impairment on MMSE. Several performances on current evaluation were commensurate with estimated premorbid intellectual abilities. However, she did demonstrate some memory difficulty and visual-spatial difficulty. Her vision impairment could be contributing to the latter. Additionally, there was evidence of more prominent executive dysfunction, slowed processing speed, and semantic retrieval deficits. Based on the test data, I would characterize her dementia as mild stage. Fortunately, she did not endorse depressed mood or significant anxiety. There do seem to be some behavioral changes per her daughter and these are likely secondary to dementia. The patient's cognitive profile is  not entirely consistent with Alzheimer's disease (she performed better on memory testing than we would typically see in AD). Her cognitive profile is more suggestive of frontal-subcortical dysfunction, which is often caused by cerebrovascular disease.    Recommendations/Plan: Based on the findings of the present evaluation, the following recommendations are offered:  1. The patient appears to have the appropriate level of support and assistance with ADLs/IADLs. Going to the senior center is likely good mental stimulation for her and enhances quality of life.  2. The patient's daughter may benefit from attending a dementia caregiver support group or attaining information from the Alzheimer's Association (which provides information/resources for dementia of all types, not just AD).  3. Optimal control of vascular risk factors is of course encouraged. 4. The patient may benefit from a medication like Namenda. She and her daughter can follow up with Dorothyann Peng about this.   Feedback to Patient: Noel Henandez returned for a feedback appointment on 11/25/2017 to review the results of her neuropsychological evaluation with this provider. 15 minutes face-to-face time was spent reviewing her test results, my impressions and my recommendations as detailed above.  Total time spent on this patient's case: [phone] unit for interview with psychologist; [phone], [phone] units of testing by psychometrician under psychologist's supervision; [phone] and [phone] units for integration of patient data, interpretation of standardized test results and clinical data, clinical decision making, treatment planning and preparation of this report, and interactive feedback with review of results to the patient/family by psychologist.      Thank you for your referral of [facility]. Please feel free to contact me if you have any questions or concerns regarding this report.

## 2017-11-25 ENCOUNTER — Ambulatory Visit (INDEPENDENT_AMBULATORY_CARE_PROVIDER_SITE_OTHER): Payer: Medicare Other | Admitting: Psychology

## 2017-11-25 ENCOUNTER — Encounter: Payer: Self-pay | Admitting: Psychology

## 2017-11-25 DIAGNOSIS — F0150 Vascular dementia without behavioral disturbance: Secondary | ICD-10-CM

## 2017-11-25 NOTE — Patient Instructions (Signed)
Test results were consistent with mild dementia. Her testing profile was more consistent with vascular dementia than Alzheimer's disease.   Recommendations/Plan: Based on the findings of the present evaluation, the following recommendations are offered:  1. The patient appears to have the appropriate level of support and assistance with ADLs/IADLs. Going to the senior center is likely good mental stimulation for her and enhances quality of life.   2. The patient's daughter may benefit from attending a dementia caregiver support group or attaining information from the Alzheimer's Association (which provides information/resources for dementia of all types, not just AD) - [employer].   3. Optimal control of vascular risk factors is of course encouraged.  4. The patient may benefit from a medication like Namenda. She and her daughter can follow up with Dorothyann Peng about this.

## 2018-01-06 DIAGNOSIS — Z01419 Encounter for gynecological examination (general) (routine) without abnormal findings: Secondary | ICD-10-CM | POA: Diagnosis not present

## 2018-01-06 DIAGNOSIS — Z6837 Body mass index (BMI) 37.0-37.9, adult: Secondary | ICD-10-CM | POA: Diagnosis not present

## 2018-01-06 DIAGNOSIS — Z1231 Encounter for screening mammogram for malignant neoplasm of breast: Secondary | ICD-10-CM | POA: Diagnosis not present

## 2018-01-07 ENCOUNTER — Encounter: Payer: Self-pay | Admitting: Adult Health

## 2018-02-04 ENCOUNTER — Other Ambulatory Visit: Payer: Self-pay | Admitting: Adult Health

## 2018-02-04 ENCOUNTER — Ambulatory Visit (INDEPENDENT_AMBULATORY_CARE_PROVIDER_SITE_OTHER): Payer: Medicare Other | Admitting: Adult Health

## 2018-02-04 ENCOUNTER — Encounter: Payer: Self-pay | Admitting: Adult Health

## 2018-02-04 VITALS — BP 146/90 | Temp 98.4°F | Ht 59.5 in | Wt 191.0 lb

## 2018-02-04 DIAGNOSIS — E7800 Pure hypercholesterolemia, unspecified: Secondary | ICD-10-CM

## 2018-02-04 DIAGNOSIS — Z794 Long term (current) use of insulin: Secondary | ICD-10-CM

## 2018-02-04 DIAGNOSIS — I10 Essential (primary) hypertension: Secondary | ICD-10-CM

## 2018-02-04 DIAGNOSIS — F0150 Vascular dementia without behavioral disturbance: Secondary | ICD-10-CM

## 2018-02-04 DIAGNOSIS — E1165 Type 2 diabetes mellitus with hyperglycemia: Principal | ICD-10-CM

## 2018-02-04 DIAGNOSIS — E118 Type 2 diabetes mellitus with unspecified complications: Secondary | ICD-10-CM

## 2018-02-04 DIAGNOSIS — IMO0001 Reserved for inherently not codable concepts without codable children: Secondary | ICD-10-CM

## 2018-02-04 DIAGNOSIS — Z760 Encounter for issue of repeat prescription: Secondary | ICD-10-CM

## 2018-02-04 LAB — CBC WITH DIFFERENTIAL/PLATELET
BASOS ABS: 0 10*3/uL (ref 0.0–0.1)
Basophils Relative: 0.3 % (ref 0.0–3.0)
EOS ABS: 0.1 10*3/uL (ref 0.0–0.7)
Eosinophils Relative: 2 % (ref 0.0–5.0)
HEMATOCRIT: 34.3 % — AB (ref 36.0–46.0)
Hemoglobin: 11.3 g/dL — ABNORMAL LOW (ref 12.0–15.0)
LYMPHS ABS: 2.1 10*3/uL (ref 0.7–4.0)
LYMPHS PCT: 39.2 % (ref 12.0–46.0)
MCHC: 32.9 g/dL (ref 30.0–36.0)
MCV: 89.1 fl (ref 78.0–100.0)
Monocytes Absolute: 0.6 10*3/uL (ref 0.1–1.0)
Monocytes Relative: 10.9 % (ref 3.0–12.0)
NEUTROS ABS: 2.6 10*3/uL (ref 1.4–7.7)
NEUTROS PCT: 47.6 % (ref 43.0–77.0)
Platelets: 219 10*3/uL (ref 150.0–400.0)
RBC: 3.85 Mil/uL — ABNORMAL LOW (ref 3.87–5.11)
RDW: 14.9 % (ref 11.5–15.5)
WBC: 5.4 10*3/uL (ref 4.0–10.5)

## 2018-02-04 LAB — HEPATIC FUNCTION PANEL
ALT: 11 U/L (ref 0–35)
AST: 14 U/L (ref 0–37)
Albumin: 3.3 g/dL — ABNORMAL LOW (ref 3.5–5.2)
Alkaline Phosphatase: 79 U/L (ref 39–117)
BILIRUBIN DIRECT: 0 mg/dL (ref 0.0–0.3)
BILIRUBIN TOTAL: 0.2 mg/dL (ref 0.2–1.2)
Total Protein: 6.8 g/dL (ref 6.0–8.3)

## 2018-02-04 LAB — TSH: TSH: 1.62 u[IU]/mL (ref 0.35–4.50)

## 2018-02-04 LAB — LIPID PANEL
CHOL/HDL RATIO: 4
Cholesterol: 163 mg/dL (ref 0–200)
HDL: 37.7 mg/dL — AB (ref >39.00)
LDL CALC: 94 mg/dL (ref 0–99)
NonHDL: 125.24
Triglycerides: 156 mg/dL — ABNORMAL HIGH (ref 0.0–149.0)
VLDL: 31.2 mg/dL (ref 0.0–40.0)

## 2018-02-04 LAB — BASIC METABOLIC PANEL
BUN: 16 mg/dL (ref 6–23)
CALCIUM: 8.8 mg/dL (ref 8.4–10.5)
CO2: 31 meq/L (ref 19–32)
CREATININE: 0.54 mg/dL (ref 0.40–1.20)
Chloride: 107 mEq/L (ref 96–112)
GFR: 140.49 mL/min (ref >60.00)
Glucose, Bld: 80 mg/dL (ref 70–99)
Potassium: 3.8 mEq/L (ref 3.5–5.1)
Sodium: 141 mEq/L (ref 135–145)

## 2018-02-04 LAB — HEMOGLOBIN A1C: Hgb A1c MFr Bld: 6.1 % (ref 4.6–6.5)

## 2018-02-04 NOTE — Patient Instructions (Signed)
It was great seeing you today   I will follow up with you regarding your blood work   If needed, we will make medication changes   Follow up with me in one year or sooner if needed

## 2018-02-04 NOTE — Progress Notes (Signed)
Subjective:    Patient ID: Lynn Swanson, female    DOB: 11-Feb-1941, 77 y.o.   MRN: [id number]  HPI Patient presents for yearly medicine examination. She is a pleasant 77 year old female who  has a past medical history of Diabetes mellitus, Glaucoma, HSV (herpes simplex virus) infection, and Hypertension.   She has a history of hypertension.  She takes Coreg, Norvasc, and Diovan.  Her blood pressure has been well controlled in the past on this medication.  In the office today she denies any headaches, blurred vision, or dizziness  She has a history of diabetes.  Current regimen includes Basaglar 75 units QHS, Actos 15 mg, and Januvia 100 mg and her diet is not well controlled and she does not eat a diabetic diet.  She does not exercise as much as she wants to  Vascular dementia -She was seen in January 2019 by neuropsychiatry.  After testing was completed her dementia was categorized as mild stage and appeared to be more vascular in origin then Alzheimer disease.  She does report that at times she will often get confused and mix up family members names.  Daughter who is with her at this visit also reports that her mother often becomes suspicious and is worrying that someone is coming into their home and taking her things.  She has not noticed any recent hallucinations.  Patient does endorse being able to sleep well and feels as though her appetite is good.  Has not had any falls recently   All immunizations and health maintenance protocols were reviewed with the patient and needed orders were placed. She is UTD on vaccinations   Appropriate screening laboratory values were ordered for the patient including screening of hyperlipidemia, renal function and hepatic function.  Medication reconciliation,  past medical history, social history, problem list and allergies were reviewed in detail with the patient  Goals were established with regard to weight loss, exercise, and  diet in compliance with  medications  End of life planning was discussed. She has an advanced directive and living will.   Mammogram was in 2012, she no longer wants to have these done  Denies any acute complaints  Review of Systems  Constitutional: Negative.   HENT: Negative.   Eyes: Negative.   Respiratory: Negative.   Cardiovascular: Negative.   Gastrointestinal: Negative.   Endocrine: Negative.   Genitourinary: Negative.   Musculoskeletal: Negative.   Skin: Negative.   Allergic/Immunologic: Negative.   Neurological: Negative.   Hematological: Negative.   Psychiatric/Behavioral: Negative.     Past Medical History:  Diagnosis Date  . Diabetes mellitus   . Glaucoma   . HSV (herpes simplex virus) infection   . Hypertension     Social History   Socioeconomic History  . Marital status: Widowed    Spouse name: Not on file  . Number of children: Not on file  . Years of education: Not on file  . Highest education level: Not on file  Occupational History  . Not on file  Social Needs  . Financial resource strain: Not on file  . Food insecurity:    Worry: Not on file    Inability: Not on file  . Transportation needs:    Medical: Not on file    Non-medical: Not on file  Tobacco Use  . Smoking status: Never Smoker  . Smokeless tobacco: Former Systems developer    Types: Snuff  Substance and Sexual Activity  . Alcohol use: No    Alcohol/week:  0.0 oz  . Drug use: No  . Sexual activity: Not on file  Lifestyle  . Physical activity:    Days per week: Not on file    Minutes per session: Not on file  . Stress: Not on file  Relationships  . Social connections:    Talks on phone: Not on file    Gets together: Not on file    Attends religious service: Not on file    Active member of club or organization: Not on file    Attends meetings of clubs or organizations: Not on file    Relationship status: Not on file  . Intimate partner violence:    Fear of current or ex partner: Not on file    Emotionally  abused: Not on file    Physically abused: Not on file    Forced sexual activity: Not on file  Other Topics Concern  . Not on file  Social History Narrative   Retired    Married, husband deceased in 02/10/03 with kidney failure   -Three daughters, one lives in [place], one in [place], one [place]. Three sons, one is [place], one is in [place], one passes away.    Lives with daughter   No pets.    Likes to walk and go to the park.        Past Surgical History:  Procedure Laterality Date  . ABDOMINAL HYSTERECTOMY      Family History  Problem Relation Age of Onset  . Diabetes Mother     No Known Allergies  Current Outpatient Medications on File Prior to Visit  Medication Sig Dispense Refill  . amLODipine (NORVASC) 5 MG tablet TAKE 1 TABLET (5 MG TOTAL) BY MOUTH DAILY. 90 tablet 0  . brimonidine (ALPHAGAN P) 0.1 % SOLN Place 1 drop into both eyes 2 (two) times daily. 15 mL 0  . carvedilol (COREG) 12.5 MG tablet TAKE 1 TABLET BY MOUTH TWICE A DAY WITH A MEAL 180 tablet 3  . dorzolamide (TRUSOPT) 2 % ophthalmic solution PLACE 1 DROP IN BOTH EYES TWICE A DAY 10 mL 1  . glucose blood (TRUETRACK TEST) test strip USE TO TEST BLOOD SUGAR TWICE A DAY AS DIRECTED (DX 250.02) 180 each 3  . Insulin Glargine (BASAGLAR KWIKPEN) 100 UNIT/ML SOPN Inject 0.75 mLs (75 Units total) into the skin at bedtime. 3 mL 11  . Insulin Pen Needle 31G X 5 MM MISC USE TO INJECT BASAGLAR ONCE DAILY 100 each 3  . Insulin Syringe-Needle U-100 (B-D INS SYR ULTRAFINE 1CC/30G) 30G X 1/2" 1 ML MISC INJECT 75 UNITS INTO THE SKIN DAILY. 100 each 3  . JANUVIA 100 MG tablet TAKE 1 TABLET (100 MG TOTAL) BY MOUTH DAILY. 90 tablet 0  . KLOR-CON M20 20 MEQ tablet TAKE 1 TABLET (20 MEQ TOTAL) BY MOUTH 2 (TWO) TIMES DAILY. 180 tablet 3  . LUMIGAN 0.01 % SOLN PLACE 1 DROP INTO BOTH EYES AT BEDTIME. 7.5 mL 3  . omeprazole (PRILOSEC) 20 MG capsule TAKE 1 CAPSULE (20 MG TOTAL) BY MOUTH DAILY. 90 capsule 0  . pioglitazone (ACTOS)  15 MG tablet TAKE 1 TABLET (15 MG TOTAL) BY MOUTH DAILY. 90 tablet 0  . simvastatin (ZOCOR) 20 MG tablet TAKE 1 TABLET (20 MG TOTAL) BY MOUTH DAILY. 90 tablet 3  . valsartan-hydrochlorothiazide (DIOVAN-HCT) 160-12.5 MG tablet TAKE 1 TABLET BY MOUTH DAILY. 90 tablet 3   No current facility-administered medications on file prior to visit.     BP Marland Kitchen)  146/90 (BP Location: Left Arm)   Temp 98.4 F (36.9 C) (Oral)   Ht 4' 11.5" (1.511 m) Comment: With Shoes  Wt 191 lb (86.6 kg)   BMI 37.93 kg/m       Objective:   Physical Exam  Constitutional: She is oriented to person, place, and time. She appears well-developed and well-nourished. No distress.  Obese  HENT:  Head: Normocephalic and atraumatic.  Right Ear: External ear normal.  Left Ear: External ear normal.  Nose: Nose normal.  Mouth/Throat: Oropharynx is clear and moist. No oropharyngeal exudate.  Eyes: Pupils are equal, round, and reactive to light. Conjunctivae and EOM are normal. Right eye exhibits no discharge. Left eye exhibits no discharge. No scleral icterus.  Neck: Normal range of motion. Neck supple. No JVD present. No tracheal deviation present. No thyromegaly present.  Cardiovascular: Normal rate, regular rhythm, normal heart sounds and intact distal pulses. Exam reveals no gallop and no friction rub.  No murmur heard. Pulmonary/Chest: Effort normal and breath sounds normal. No stridor. No respiratory distress. She has no wheezes. She has no rales. She exhibits no tenderness.  Abdominal: Soft. Bowel sounds are normal. She exhibits no distension and no mass. There is no tenderness. There is no rebound and no guarding.  Musculoskeletal: Normal range of motion. She exhibits edema (+1 pitting edema to bilateral lower extremities). She exhibits no tenderness or deformity.  Lymphadenopathy:    She has no cervical adenopathy.  Neurological: She is alert and oriented to person, place, and time. She has normal reflexes. She  displays normal reflexes. No cranial nerve deficit. She exhibits normal muscle tone. Coordination normal.  Skin: Skin is warm and dry. No rash noted. She is not diaphoretic. No erythema. No pallor.  Psychiatric: She has a normal mood and affect. Her behavior is normal. Judgment and thought content normal. Cognition and memory are normal.  Nursing note and vitals reviewed.     Assessment & Plan:    1. Type 2 diabetes mellitus with complication, with long-term current use of insulin (HCC) -Consider change in medication dose -By his diabetic diet and to walk at least 3 times a week - Basic metabolic panel - CBC with Differential/Platelet - Hemoglobin A1c - Hepatic function panel - Lipid panel - TSH  2. Hypercholesteremia -At her change in statin dose - Basic metabolic panel - CBC with Differential/Platelet - Hemoglobin A1c - Hepatic function panel - Lipid panel - TSH  3. Essential hypertension, benign -Goal for patient.  We will continue to monitor.  No changes in medication - Basic metabolic panel - CBC with Differential/Platelet - Hemoglobin A1c - Hepatic function panel - Lipid panel - TSH  4. Vascular dementia without behavioral disturbance -We discussed starting Namenda.  The patient and her daughter would like to hold off on doing this at this time.  Patient feels as though she is already taking too many medications   Dorothyann Peng, NP

## 2018-02-05 NOTE — Telephone Encounter (Signed)
Sent to the pharmacy by e-scribe. 

## 2018-02-08 ENCOUNTER — Other Ambulatory Visit: Payer: Self-pay | Admitting: Adult Health

## 2018-02-08 DIAGNOSIS — Z760 Encounter for issue of repeat prescription: Secondary | ICD-10-CM

## 2018-02-10 NOTE — Telephone Encounter (Signed)
Sent to the pharmacy by e-scribe. 

## 2018-02-12 ENCOUNTER — Encounter: Payer: Self-pay | Admitting: Adult Health

## 2018-02-13 DIAGNOSIS — L603 Nail dystrophy: Secondary | ICD-10-CM | POA: Diagnosis not present

## 2018-02-13 DIAGNOSIS — L84 Corns and callosities: Secondary | ICD-10-CM | POA: Diagnosis not present

## 2018-02-13 DIAGNOSIS — E1151 Type 2 diabetes mellitus with diabetic peripheral angiopathy without gangrene: Secondary | ICD-10-CM | POA: Diagnosis not present

## 2018-02-13 DIAGNOSIS — I739 Peripheral vascular disease, unspecified: Secondary | ICD-10-CM | POA: Diagnosis not present

## 2018-02-27 ENCOUNTER — Telehealth: Payer: Self-pay | Admitting: Adult Health

## 2018-02-27 ENCOUNTER — Encounter: Payer: Self-pay | Admitting: Adult Health

## 2018-02-27 NOTE — Telephone Encounter (Signed)
This encounter was created in error - please disregard.

## 2018-02-27 NOTE — Telephone Encounter (Signed)
Result note read to pt's daughter 'Lynn Swanson; states could not get through on [place].  Verbalizes understanding.   Would like copy mailed: [address] [place] [address]   Also requesting" A1C rechecked in 2 months, to keep up with it."  [phone]  Result note not routed to [facility]

## 2018-02-27 NOTE — Telephone Encounter (Signed)
Copy of labs mailed.  I placed a message inside to come back in 6 months for A1C check.

## 2018-05-06 ENCOUNTER — Other Ambulatory Visit: Payer: Self-pay | Admitting: Adult Health

## 2018-05-06 DIAGNOSIS — Z760 Encounter for issue of repeat prescription: Secondary | ICD-10-CM

## 2018-05-06 DIAGNOSIS — IMO0001 Reserved for inherently not codable concepts without codable children: Secondary | ICD-10-CM

## 2018-05-06 DIAGNOSIS — Z794 Long term (current) use of insulin: Principal | ICD-10-CM

## 2018-05-06 DIAGNOSIS — E1165 Type 2 diabetes mellitus with hyperglycemia: Principal | ICD-10-CM

## 2018-05-08 NOTE — Telephone Encounter (Signed)
Will you be seeing pt every 3 or 6 months for A1C check?

## 2018-05-10 NOTE — Telephone Encounter (Signed)
Every 6 months is fine for her

## 2018-05-12 NOTE — Telephone Encounter (Signed)
Medication sent to the pharmacy by e-scribe.

## 2018-05-18 DIAGNOSIS — E1151 Type 2 diabetes mellitus with diabetic peripheral angiopathy without gangrene: Secondary | ICD-10-CM | POA: Diagnosis not present

## 2018-05-18 DIAGNOSIS — I739 Peripheral vascular disease, unspecified: Secondary | ICD-10-CM | POA: Diagnosis not present

## 2018-05-18 DIAGNOSIS — L84 Corns and callosities: Secondary | ICD-10-CM | POA: Diagnosis not present

## 2018-05-18 DIAGNOSIS — L603 Nail dystrophy: Secondary | ICD-10-CM | POA: Diagnosis not present

## 2018-08-03 ENCOUNTER — Other Ambulatory Visit: Payer: Self-pay | Admitting: Adult Health

## 2018-08-03 DIAGNOSIS — IMO0001 Reserved for inherently not codable concepts without codable children: Secondary | ICD-10-CM

## 2018-08-03 DIAGNOSIS — Z794 Long term (current) use of insulin: Principal | ICD-10-CM

## 2018-08-03 DIAGNOSIS — E1165 Type 2 diabetes mellitus with hyperglycemia: Principal | ICD-10-CM

## 2018-08-03 DIAGNOSIS — Z760 Encounter for issue of repeat prescription: Secondary | ICD-10-CM

## 2018-08-04 NOTE — Telephone Encounter (Signed)
Tried to reach the pt.  Received a message that the mailbox is full and cannot accept messages.  Will try to reach her at a later time.

## 2018-08-05 ENCOUNTER — Other Ambulatory Visit: Payer: Self-pay | Admitting: Adult Health

## 2018-08-14 NOTE — Telephone Encounter (Signed)
Tried to reach the pt but received a message that the mailbox is full.  Will try again at a later time.

## 2018-08-19 NOTE — Telephone Encounter (Signed)
Sent to the pharmacy by e-scribe for 30 days. 

## 2018-08-25 ENCOUNTER — Other Ambulatory Visit: Payer: Self-pay | Admitting: Adult Health

## 2018-10-09 ENCOUNTER — Other Ambulatory Visit: Payer: Self-pay | Admitting: Family Medicine

## 2018-10-09 ENCOUNTER — Other Ambulatory Visit: Payer: Self-pay | Admitting: Adult Health

## 2018-10-09 DIAGNOSIS — IMO0001 Reserved for inherently not codable concepts without codable children: Secondary | ICD-10-CM

## 2018-10-09 DIAGNOSIS — Z794 Long term (current) use of insulin: Principal | ICD-10-CM

## 2018-10-09 DIAGNOSIS — E1165 Type 2 diabetes mellitus with hyperglycemia: Principal | ICD-10-CM

## 2018-10-09 DIAGNOSIS — Z760 Encounter for issue of repeat prescription: Secondary | ICD-10-CM

## 2018-10-09 NOTE — Telephone Encounter (Signed)
Received a refill request from [facility] on [address] for pt's Actos.  I see pt is in a nursing facility.  Please advise.

## 2018-10-13 MED ORDER — PIOGLITAZONE HCL 15 MG PO TABS: ORAL_TABLET | ORAL | 0 refills | Status: DC

## 2018-10-13 NOTE — Telephone Encounter (Signed)
Filled earlier today for 30 days.  Nothing further needed.

## 2018-11-12 ENCOUNTER — Encounter: Payer: Self-pay | Admitting: Adult Health

## 2018-11-12 ENCOUNTER — Ambulatory Visit (INDEPENDENT_AMBULATORY_CARE_PROVIDER_SITE_OTHER): Payer: Medicare Other | Admitting: Adult Health

## 2018-11-12 VITALS — BP 140/80 | Temp 97.8°F | Wt 190.0 lb

## 2018-11-12 DIAGNOSIS — L603 Nail dystrophy: Secondary | ICD-10-CM | POA: Diagnosis not present

## 2018-11-12 DIAGNOSIS — Z794 Long term (current) use of insulin: Secondary | ICD-10-CM

## 2018-11-12 DIAGNOSIS — I10 Essential (primary) hypertension: Secondary | ICD-10-CM

## 2018-11-12 DIAGNOSIS — E118 Type 2 diabetes mellitus with unspecified complications: Secondary | ICD-10-CM

## 2018-11-12 DIAGNOSIS — Z23 Encounter for immunization: Secondary | ICD-10-CM | POA: Diagnosis not present

## 2018-11-12 DIAGNOSIS — I739 Peripheral vascular disease, unspecified: Secondary | ICD-10-CM | POA: Diagnosis not present

## 2018-11-12 DIAGNOSIS — E1151 Type 2 diabetes mellitus with diabetic peripheral angiopathy without gangrene: Secondary | ICD-10-CM | POA: Diagnosis not present

## 2018-11-12 DIAGNOSIS — L84 Corns and callosities: Secondary | ICD-10-CM | POA: Diagnosis not present

## 2018-11-12 LAB — POCT GLYCOSYLATED HEMOGLOBIN (HGB A1C): HbA1c, POC (controlled diabetic range): 6.1 % (ref 0.0–7.0)

## 2018-11-12 NOTE — Patient Instructions (Signed)
It was great seeing you today   I am glad you are doing well.   Your A1c has not changed. I am going to have you stop Actos and continue with insulin therapy   Follow up in April for your physical exam

## 2018-11-12 NOTE — Progress Notes (Signed)
Subjective:    Patient ID: Lynn Swanson, female    DOB: 04-Jun-1941, 78 y.o.   MRN: [id number]  HPI 78 year old female who  has a past medical history of Diabetes mellitus, Glaucoma, HSV (herpes simplex virus) infection, Hypertension, and Vascular dementia.  She presents to the office today for follow up regarding DM. He current regimen includes Januvia 100 mg, Actos 15 daily, and Basaglar 75 units QHS  Lab Results  Component Value Date   HGBA1C 6.1 02/04/2018    Today in the office his daughter reports that since April she has not been taking Januvia. Kdur, coreg,  prilosec, zocor, and Diovan was also stopped.   Overall the patient reports feeling " really good". She does not monitor her blood sugar or blood pressure on a regular basis at home.   She denies hypoglycemia, lightheadedness, dizziness, or syncope.   Review of Systems See HPI   Past Medical History:  Diagnosis Date  . Diabetes mellitus   . Glaucoma   . HSV (herpes simplex virus) infection   . Hypertension   . Vascular dementia [facility])     Social History   Socioeconomic History  . Marital status: Widowed    Spouse name: Not on file  . Number of children: Not on file  . Years of education: Not on file  . Highest education level: Not on file  Occupational History  . Not on file  Social Needs  . Financial resource strain: Not on file  . Food insecurity:    Worry: Not on file    Inability: Not on file  . Transportation needs:    Medical: Not on file    Non-medical: Not on file  Tobacco Use  . Smoking status: Never Smoker  . Smokeless tobacco: Former Systems developer    Types: Snuff  Substance and Sexual Activity  . Alcohol use: No    Alcohol/week: 0.0 standard drinks  . Drug use: No  . Sexual activity: Not on file  Lifestyle  . Physical activity:    Days per week: Not on file    Minutes per session: Not on file  . Stress: Not on file  Relationships  . Social connections:    Talks on phone: Not on file   Gets together: Not on file    Attends religious service: Not on file    Active member of club or organization: Not on file    Attends meetings of clubs or organizations: Not on file    Relationship status: Not on file  . Intimate partner violence:    Fear of current or ex partner: Not on file    Emotionally abused: Not on file    Physically abused: Not on file    Forced sexual activity: Not on file  Other Topics Concern  . Not on file  Social History Narrative   Retired    Married, husband deceased in 02-Feb-2003 with kidney failure   -Three daughters, one lives in [place], one in [place], one [place]. Three sons, one is [place], one is in [place], one passes away.    Lives with daughter   No pets.    Likes to walk and go to the park.        Past Surgical History:  Procedure Laterality Date  . ABDOMINAL HYSTERECTOMY      Family History  Problem Relation Age of Onset  . Diabetes Mother     No Known Allergies  Current Outpatient Medications on File Prior to  Visit  Medication Sig Dispense Refill  . amLODipine (NORVASC) 5 MG tablet TAKE 1 TABLET (5 MG TOTAL) BY MOUTH DAILY. 90 tablet 3  . brimonidine (ALPHAGAN P) 0.1 % SOLN Place 1 drop into both eyes 2 (two) times daily. 15 mL 0  . dorzolamide (TRUSOPT) 2 % ophthalmic solution PLACE 1 DROP IN BOTH EYES TWICE A DAY 10 mL 1  . glucose blood (TRUETRACK TEST) test strip USE TO TEST BLOOD SUGAR TWICE A DAY AS DIRECTED (DX 250.02) 180 each 3  . Insulin Glargine (BASAGLAR KWIKPEN) 100 UNIT/ML SOPN Inject 0.75 mLs (75 Units total) into the skin at bedtime. 3 mL 11  . Insulin Pen Needle (B-D UF III MINI PEN NEEDLES) 31G X 5 MM MISC USE TO INJECT BASAGLAR ONCE DAILY 100 each 3  . LUMIGAN 0.01 % SOLN PLACE 1 DROP INTO BOTH EYES AT BEDTIME. 7.5 mL 3  . simvastatin (ZOCOR) 20 MG tablet TAKE 1 TABLET (20 MG TOTAL) BY MOUTH DAILY. 90 tablet 3   No current facility-administered medications on file prior to visit.     BP 140/80   Temp 97.8  F (36.6 C)   Wt 190 lb (86.2 kg)   BMI 37.73 kg/m       Objective:   Physical Exam Vitals signs and nursing note reviewed.  Constitutional:      Appearance: Normal appearance.  Cardiovascular:     Rate and Rhythm: Normal rate and regular rhythm.     Pulses: Normal pulses.     Heart sounds: Normal heart sounds.  Pulmonary:     Effort: Pulmonary effort is normal.     Breath sounds: Normal breath sounds.  Musculoskeletal:     Right lower leg: No edema.     Left lower leg: Edema (trace pitting) present.  Skin:    General: Skin is warm and dry.     Capillary Refill: Capillary refill takes less than 2 seconds.  Neurological:     Mental Status: She is alert.       Assessment & Plan:  1. Type 2 diabetes mellitus with complication, with long-term current use of insulin (HCC)  - POCT A1C - 6.1  - has not changed.  - Will d/c Actos  - Follow up in April for CPE  2. Essential hypertension, benign - BP has not varied much since stopping Diovan and Coreg. Will keep her on Norvasc.  BP Readings from Last 3 Encounters:  11/12/18 140/80  02/04/18 (!) 146/90  09/22/17 132/72     3. Need for influenza vaccination  - Flu vaccine HIGH DOSE PF (Fluzone High dose)   Dorothyann Peng, NP

## 2018-11-27 ENCOUNTER — Encounter: Payer: Self-pay | Admitting: Adult Health

## 2018-11-27 ENCOUNTER — Ambulatory Visit (INDEPENDENT_AMBULATORY_CARE_PROVIDER_SITE_OTHER): Payer: Medicare Other | Admitting: Adult Health

## 2018-11-27 VITALS — BP 130/80 | HR 67 | Temp 99.0°F | Ht 59.5 in | Wt 193.8 lb

## 2018-11-27 DIAGNOSIS — L918 Other hypertrophic disorders of the skin: Secondary | ICD-10-CM

## 2018-11-27 NOTE — Progress Notes (Signed)
S: The patient complains of symptomatic skin tags under the left breast and left buttock.  These are irritated by clothing,rubbing, and cleaning  O: Patient appears well. Two benign skin tags are noted on the    A: Skin tags   P: Skin tags are snipped off using Betadine for cleansing and sterile iris scissors. Local anesthesia was not used. These pathognomonic lesions are not sent for pathology.  Dorothyann Peng, NP

## 2018-12-04 DIAGNOSIS — H31091 Other chorioretinal scars, right eye: Secondary | ICD-10-CM | POA: Diagnosis not present

## 2018-12-04 DIAGNOSIS — E113511 Type 2 diabetes mellitus with proliferative diabetic retinopathy with macular edema, right eye: Secondary | ICD-10-CM | POA: Diagnosis not present

## 2018-12-04 DIAGNOSIS — E113522 Type 2 diabetes mellitus with proliferative diabetic retinopathy with traction retinal detachment involving the macula, left eye: Secondary | ICD-10-CM | POA: Diagnosis not present

## 2018-12-04 DIAGNOSIS — H31011 Macula scars of posterior pole (postinflammatory) (post-traumatic), right eye: Secondary | ICD-10-CM | POA: Diagnosis not present

## 2018-12-17 ENCOUNTER — Other Ambulatory Visit: Payer: Self-pay | Admitting: Family Medicine

## 2018-12-17 MED ORDER — BASAGLAR KWIKPEN 100 UNIT/ML ~~LOC~~ SOPN: 75.0000 [IU] | PEN_INJECTOR | Freq: Every day | SUBCUTANEOUS | 1 refills | Status: DC

## 2018-12-17 NOTE — Telephone Encounter (Signed)
Sent to the pharmacy by e-scribe. 

## 2019-01-25 DIAGNOSIS — Z1231 Encounter for screening mammogram for malignant neoplasm of breast: Secondary | ICD-10-CM | POA: Diagnosis not present

## 2019-01-25 DIAGNOSIS — Z6838 Body mass index (BMI) 38.0-38.9, adult: Secondary | ICD-10-CM | POA: Diagnosis not present

## 2019-01-25 DIAGNOSIS — Z124 Encounter for screening for malignant neoplasm of cervix: Secondary | ICD-10-CM | POA: Diagnosis not present

## 2019-02-12 ENCOUNTER — Encounter: Payer: Medicare Other | Admitting: Adult Health

## 2019-02-18 ENCOUNTER — Encounter: Payer: Medicare Other | Admitting: Adult Health

## 2019-03-10 ENCOUNTER — Other Ambulatory Visit: Payer: Self-pay | Admitting: Adult Health

## 2019-03-10 DIAGNOSIS — Z760 Encounter for issue of repeat prescription: Secondary | ICD-10-CM

## 2019-03-11 NOTE — Telephone Encounter (Signed)
Sent to the pharmacy by e-scribe. 

## 2019-03-11 NOTE — Telephone Encounter (Signed)
Ok to refill medication.   Her A1c has been stable for some time now, we can do Q 6 months

## 2019-03-23 ENCOUNTER — Telehealth: Payer: Self-pay | Admitting: *Deleted

## 2019-03-23 NOTE — Telephone Encounter (Signed)
Copied from [place] [id number]. Topic: General - Inquiry >> Mar 23, 2019  3:45 PM Alanda Slim E wrote: Reason for CRM: Pt daughter called to inquire about Fl2 paperwork and if it has been completed and faxed back / please advise

## 2019-03-24 NOTE — Telephone Encounter (Signed)
Spoke to [employer] and informed her to pick up paper work at the front desk.  Nothing further needed.

## 2019-04-22 ENCOUNTER — Encounter: Payer: Self-pay | Admitting: Adult Health

## 2019-04-22 ENCOUNTER — Telehealth: Payer: Self-pay | Admitting: General Practice

## 2019-04-22 ENCOUNTER — Other Ambulatory Visit: Payer: Medicare Other

## 2019-04-22 ENCOUNTER — Other Ambulatory Visit: Payer: Self-pay

## 2019-04-22 ENCOUNTER — Ambulatory Visit (INDEPENDENT_AMBULATORY_CARE_PROVIDER_SITE_OTHER): Payer: Medicare Other | Admitting: Adult Health

## 2019-04-22 VITALS — BP 166/82 | Temp 97.5°F | Wt 186.0 lb

## 2019-04-22 DIAGNOSIS — F0150 Vascular dementia without behavioral disturbance: Secondary | ICD-10-CM | POA: Diagnosis not present

## 2019-04-22 DIAGNOSIS — I10 Essential (primary) hypertension: Secondary | ICD-10-CM | POA: Diagnosis not present

## 2019-04-22 DIAGNOSIS — Z20822 Contact with and (suspected) exposure to covid-19: Secondary | ICD-10-CM

## 2019-04-22 DIAGNOSIS — E118 Type 2 diabetes mellitus with unspecified complications: Secondary | ICD-10-CM

## 2019-04-22 DIAGNOSIS — E7800 Pure hypercholesterolemia, unspecified: Secondary | ICD-10-CM | POA: Diagnosis not present

## 2019-04-22 DIAGNOSIS — Z794 Long term (current) use of insulin: Secondary | ICD-10-CM | POA: Diagnosis not present

## 2019-04-22 DIAGNOSIS — R6889 Other general symptoms and signs: Secondary | ICD-10-CM | POA: Diagnosis not present

## 2019-04-22 LAB — HEMOGLOBIN A1C: Hgb A1c MFr Bld: 7 % — ABNORMAL HIGH (ref 4.6–6.5)

## 2019-04-22 LAB — COMPREHENSIVE METABOLIC PANEL
ALT: 13 U/L (ref 0–35)
AST: 10 U/L (ref 0–37)
Albumin: 3.8 g/dL (ref 3.5–5.2)
Alkaline Phosphatase: 81 U/L (ref 39–117)
BUN: 16 mg/dL (ref 6–23)
CO2: 30 mEq/L (ref 19–32)
Calcium: 9.1 mg/dL (ref 8.4–10.5)
Chloride: 108 mEq/L (ref 96–112)
Creatinine, Ser: 0.67 mg/dL (ref 0.40–1.20)
GFR: 102.73 mL/min (ref >60.00)
Glucose, Bld: 93 mg/dL (ref 70–99)
Potassium: 4 mEq/L (ref 3.5–5.1)
Sodium: 144 mEq/L (ref 135–145)
Total Bilirubin: 0.3 mg/dL (ref 0.2–1.2)
Total Protein: 6.5 g/dL (ref 6.0–8.3)

## 2019-04-22 LAB — CBC WITH DIFFERENTIAL/PLATELET
Basophils Absolute: 0 10*3/uL (ref 0.0–0.1)
Basophils Relative: 0.4 % (ref 0.0–3.0)
Eosinophils Absolute: 0.1 10*3/uL (ref 0.0–0.7)
Eosinophils Relative: 1.8 % (ref 0.0–5.0)
HCT: 37.9 % (ref 36.0–46.0)
Hemoglobin: 12.3 g/dL (ref 12.0–15.0)
Lymphocytes Relative: 44.8 % (ref 12.0–46.0)
Lymphs Abs: 2.3 10*3/uL (ref 0.7–4.0)
MCHC: 32.5 g/dL (ref 30.0–36.0)
MCV: 89.2 fl (ref 78.0–100.0)
Monocytes Absolute: 0.6 10*3/uL (ref 0.1–1.0)
Monocytes Relative: 11.6 % (ref 3.0–12.0)
Neutro Abs: 2.1 10*3/uL (ref 1.4–7.7)
Neutrophils Relative %: 41.4 % — ABNORMAL LOW (ref 43.0–77.0)
Platelets: 204 10*3/uL (ref 150.0–400.0)
RBC: 4.25 Mil/uL (ref 3.87–5.11)
RDW: 14.6 % (ref 11.5–15.5)
WBC: 5 10*3/uL (ref 4.0–10.5)

## 2019-04-22 LAB — LIPID PANEL
Cholesterol: 173 mg/dL (ref 0–200)
HDL: 40.2 mg/dL (ref >39.00)
LDL Cholesterol: 115 mg/dL — ABNORMAL HIGH (ref 0–99)
NonHDL: 132.84
Total CHOL/HDL Ratio: 4
Triglycerides: 88 mg/dL (ref 0.0–149.0)
VLDL: 17.6 mg/dL (ref 0.0–40.0)

## 2019-04-22 LAB — TSH: TSH: 1.86 u[IU]/mL (ref 0.35–4.50)

## 2019-04-22 NOTE — Telephone Encounter (Signed)
-----   Message from Hulda Humphrey, [place] sent at 04/22/2019  8:27 AM EDT ----- Regarding: COVID-19 TEST Pt needs Covid testing in order to return to facility.  Please schedule.  Thanks

## 2019-04-22 NOTE — Progress Notes (Signed)
Subjective:    Patient ID: Lynn Swanson, female    DOB: 27-Jul-1941, 78 y.o.   MRN: [id number]  HPI  Patient presents for yearly preventative medicine examination. She is a pleasant 78 year old female who  has a past medical history of Diabetes mellitus, Glaucoma, HSV (herpes simplex virus) infection, Hypertension, and Vascular dementia ([place]).  Essential Hypertension -Currently prescribed Norvasc 5 mg. Did not take her medication this morning.  BP Readings from Last 3 Encounters:  04/22/19 (!) 166/82  11/27/18 130/80  11/12/18 140/80   Diabetes mellitus-currently prescribed Basaglar 75 units nightly.  During her last visit in January 2020 Actos was DC'd due to A1c being 6.1. She is checking her blood sugars at home consistenlty and reports that her readings are between 120-140.   Hyperlipidemia-takes Zocor 20 mg daily  H/o Vascular Dementia-seen in January 2019 by neuropsych.  After testing was completed she was categorized as having a mild stage vascular dementia.  She will at times confused and mixup family members names.  Recent hallucinations and she is sleeping well and her appetite is good.  Has not had any falls recently  Covid Testing - she needs to have Covid testing done to return to [facility].   All immunizations and health maintenance protocols were reviewed with the patient and needed orders were placed. UTD  Appropriate screening laboratory values were ordered for the patient including screening of hyperlipidemia, renal function and hepatic function.  Medication reconciliation,  past medical history, social history, problem list and allergies were reviewed in detail with the patient  Goals were established with regard to weight loss, exercise, and  diet in compliance with medications  Wt Readings from Last 3 Encounters:  04/22/19 186 lb (84.4 kg)  11/27/18 193 lb 12.8 oz (87.9 kg)  11/12/18 190 lb (86.2 kg)    End of life planning was discussed.  She has an  advanced directive and living well  Review of Systems  Constitutional: Negative.   HENT: Negative.   Eyes: Negative.   Respiratory: Negative.   Cardiovascular: Negative.   Gastrointestinal: Negative.   Endocrine: Negative.   Genitourinary: Negative.   Musculoskeletal: Negative.   Skin: Negative.   Allergic/Immunologic: Negative.   Neurological: Negative.   Hematological: Negative.   Psychiatric/Behavioral: Positive for confusion.   Past Medical History:  Diagnosis Date  . Diabetes mellitus   . Glaucoma   . HSV (herpes simplex virus) infection   . Hypertension   . Vascular dementia [facility])     Social History   Socioeconomic History  . Marital status: Widowed    Spouse name: Not on file  . Number of children: Not on file  . Years of education: Not on file  . Highest education level: Not on file  Occupational History  . Not on file  Social Needs  . Financial resource strain: Not on file  . Food insecurity    Worry: Not on file    Inability: Not on file  . Transportation needs    Medical: Not on file    Non-medical: Not on file  Tobacco Use  . Smoking status: Never Smoker  . Smokeless tobacco: Former Systems developer    Types: Snuff  Substance and Sexual Activity  . Alcohol use: No    Alcohol/week: 0.0 standard drinks  . Drug use: No  . Sexual activity: Not on file  Lifestyle  . Physical activity    Days per week: Not on file    Minutes per session:  Not on file  . Stress: Not on file  Relationships  . Social Herbalist on phone: Not on file    Gets together: Not on file    Attends religious service: Not on file    Active member of club or organization: Not on file    Attends meetings of clubs or organizations: Not on file    Relationship status: Not on file  . Intimate partner violence    Fear of current or ex partner: Not on file    Emotionally abused: Not on file    Physically abused: Not on file    Forced sexual activity: Not on file  Other Topics  Concern  . Not on file  Social History Narrative   Retired    Married, husband deceased in 2003-02-21 with kidney failure   -Three daughters, one lives in [place], one in [place], one [place]. Three sons, one is [place], one is in [place], one passes away.    Lives with daughter   No pets.    Likes to walk and go to the park.        Past Surgical History:  Procedure Laterality Date  . ABDOMINAL HYSTERECTOMY      Family History  Problem Relation Age of Onset  . Diabetes Mother     No Known Allergies  Current Outpatient Medications on File Prior to Visit  Medication Sig Dispense Refill  . amLODipine (NORVASC) 5 MG tablet TAKE 1 TABLET (5 MG TOTAL) BY MOUTH DAILY. 90 tablet 1  . brimonidine (ALPHAGAN P) 0.1 % SOLN Place 1 drop into both eyes 2 (two) times daily. 15 mL 0  . dorzolamide (TRUSOPT) 2 % ophthalmic solution PLACE 1 DROP IN BOTH EYES TWICE A DAY 10 mL 1  . glucose blood (TRUETRACK TEST) test strip USE TO TEST BLOOD SUGAR TWICE A DAY AS DIRECTED (DX 250.02) 180 each 3  . Insulin Glargine (BASAGLAR KWIKPEN) 100 UNIT/ML SOPN Inject 0.75 mLs (75 Units total) into the skin at bedtime. 60 mL 1  . Insulin Pen Needle (B-D UF III MINI PEN NEEDLES) 31G X 5 MM MISC USE TO INJECT BASAGLAR ONCE DAILY 100 each 3  . LUMIGAN 0.01 % SOLN PLACE 1 DROP INTO BOTH EYES AT BEDTIME. 7.5 mL 3   No current facility-administered medications on file prior to visit.     BP (!) 166/82   Temp (!) 97.5 F (36.4 C) (Oral)   Wt 186 lb (84.4 kg)   BMI 36.94 kg/m       Objective:   Physical Exam Vitals signs and nursing note reviewed.  Constitutional:      General: She is not in acute distress.    Appearance: She is well-developed. She is obese.  HENT:     Head: Normocephalic and atraumatic.     Right Ear: Tympanic membrane, ear canal and external ear normal. There is no impacted cerumen.     Left Ear: Tympanic membrane, ear canal and external ear normal. There is no impacted cerumen.      Nose: Nose normal.     Mouth/Throat:     Mouth: Mucous membranes are moist.     Pharynx: Oropharynx is clear. No oropharyngeal exudate.  Eyes:     General:        Right eye: No discharge.        Left eye: No discharge.     Extraocular Movements: Extraocular movements intact.     Conjunctiva/sclera: Conjunctivae normal.  Pupils: Pupils are equal, round, and reactive to light.  Neck:     Musculoskeletal: Normal range of motion and neck supple.     Thyroid: No thyromegaly.     Trachea: No tracheal deviation.  Cardiovascular:     Rate and Rhythm: Normal rate and regular rhythm.     Heart sounds: Normal heart sounds. No murmur. No friction rub. No gallop.   Pulmonary:     Effort: Pulmonary effort is normal. No respiratory distress.     Breath sounds: Normal breath sounds. No stridor. No wheezing, rhonchi or rales.  Chest:     Chest wall: No tenderness.  Abdominal:     General: Bowel sounds are normal. There is no distension.     Palpations: Abdomen is soft. There is no mass.     Tenderness: There is no abdominal tenderness. There is no right CVA tenderness, left CVA tenderness, guarding or rebound.     Hernia: No hernia is present.  Musculoskeletal: Normal range of motion.        General: No swelling, tenderness, deformity or signs of injury.     Right lower leg: No edema.     Left lower leg: No edema.  Lymphadenopathy:     Cervical: No cervical adenopathy.  Skin:    General: Skin is warm and dry.     Coloration: Skin is not jaundiced or pale.     Findings: No bruising, erythema, lesion or rash.  Neurological:     General: No focal deficit present.     Mental Status: She is alert and oriented to person, place, and time. Mental status is at baseline.     Cranial Nerves: No cranial nerve deficit.     Coordination: Coordination normal.  Psychiatric:        Mood and Affect: Mood normal.        Behavior: Behavior normal.        Thought Content: Thought content normal.         Judgment: Judgment normal.        Assessment & Plan:  1. Essential hypertension, benign - BP elevated today but she did not take her medication today  - CBC with Differential/Platelet - Comprehensive metabolic panel - Hemoglobin A1c - Lipid panel - TSH  2. Type 2 diabetes mellitus with complication, with long-term current use of insulin (HCC) - Consider adding back Actos  - CBC with Differential/Platelet - Comprehensive metabolic panel - Hemoglobin A1c - Lipid panel - TSH  3. Hypercholesteremia - Consider increase in statin  - CBC with Differential/Platelet - Comprehensive metabolic panel - Hemoglobin A1c - Lipid panel - TSH  4. Vascular dementia without behavioral disturbance (HCC) - No change. Continue to monitor  - CBC with Differential/Platelet - Comprehensive metabolic panel - Hemoglobin A1c - Lipid panel - TSH   Dorothyann Peng, NP

## 2019-04-22 NOTE — Telephone Encounter (Signed)
Pt has been scheduled for covid testing.  Scheduled pt with her daughter Orbie Hurst.  Pt was referred by: Dorothyann Peng, NP

## 2019-04-22 NOTE — Addendum Note (Signed)
Addended by: Denman George on: 04/22/2019 09:38 AM   Modules accepted: Orders

## 2019-04-26 LAB — NOVEL CORONAVIRUS, NAA: SARS-CoV-2, NAA: NOT DETECTED

## 2019-05-21 DIAGNOSIS — H31011 Macula scars of posterior pole (postinflammatory) (post-traumatic), right eye: Secondary | ICD-10-CM | POA: Diagnosis not present

## 2019-05-21 DIAGNOSIS — H43812 Vitreous degeneration, left eye: Secondary | ICD-10-CM | POA: Diagnosis not present

## 2019-05-21 DIAGNOSIS — E113522 Type 2 diabetes mellitus with proliferative diabetic retinopathy with traction retinal detachment involving the macula, left eye: Secondary | ICD-10-CM | POA: Diagnosis not present

## 2019-05-21 DIAGNOSIS — H31091 Other chorioretinal scars, right eye: Secondary | ICD-10-CM | POA: Diagnosis not present

## 2019-06-24 IMAGING — CT CT ABD-PELV W/ CM
2 of 5 series · 17 of 46 positions shown, 19 images · IV contrast (APPLIED)
Comparison: None

CLINICAL DATA: Abdominal pain.  Nausea and vomiting for 2 days

EXAM:
CT ABDOMEN AND PELVIS WITH CONTRAST
TECHNIQUE: Multidetector CT imaging of the abdomen and pelvis was performed
using the standard protocol following bolus administration of
intravenous contrast.
CONTRAST:  80mL F23WVJ-YVV IOPAMIDOL (F23WVJ-YVV) INJECTION 61%

[Series 2: axial st · axial · 0.97mm/px · z∈[-554,-78]mm · 14 of 107 slices shown, 16 images]
[im 6/107  soft-tissue]
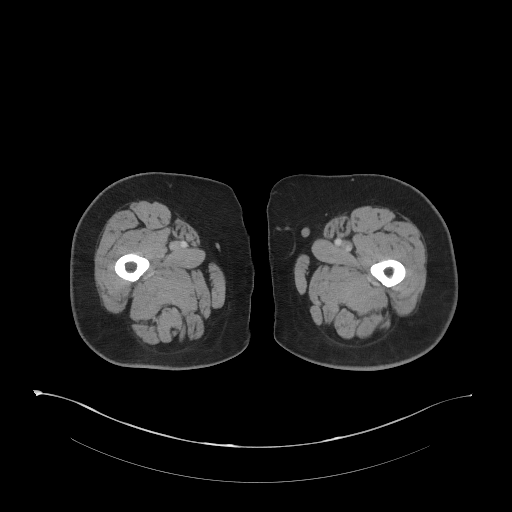
[im 6/107  bone]
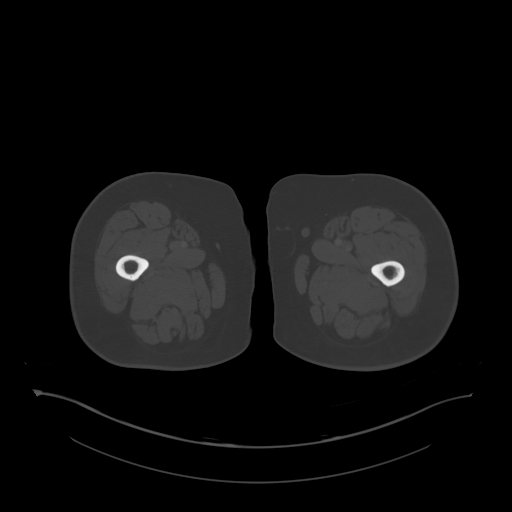
[im 16/107  soft-tissue]
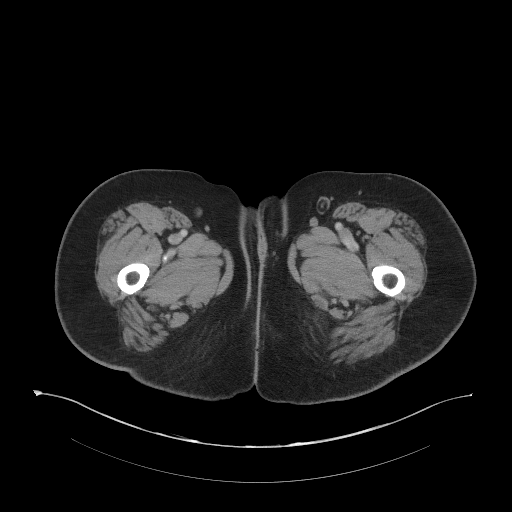
[im 22/107  soft-tissue]
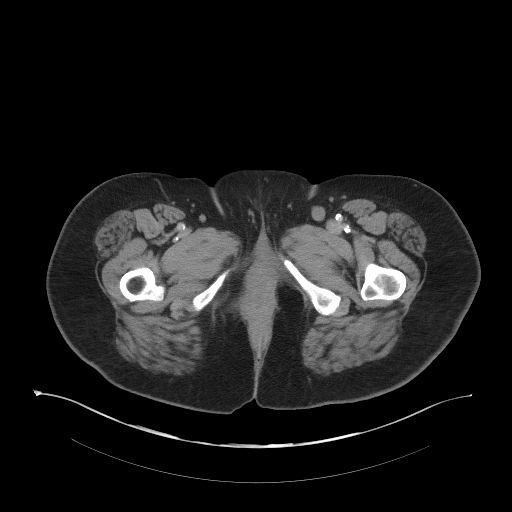
[im 27/107  soft-tissue]
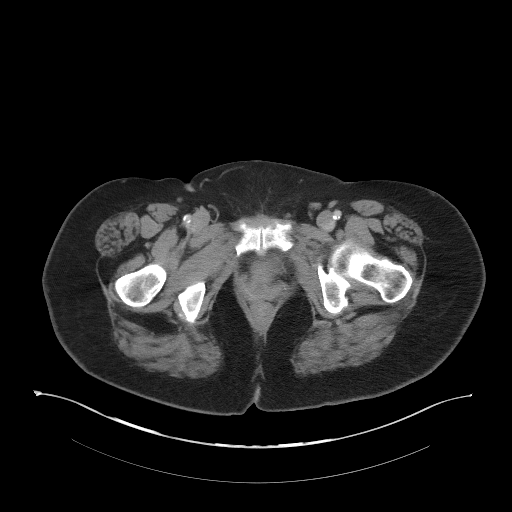
[im 38/107  soft-tissue]
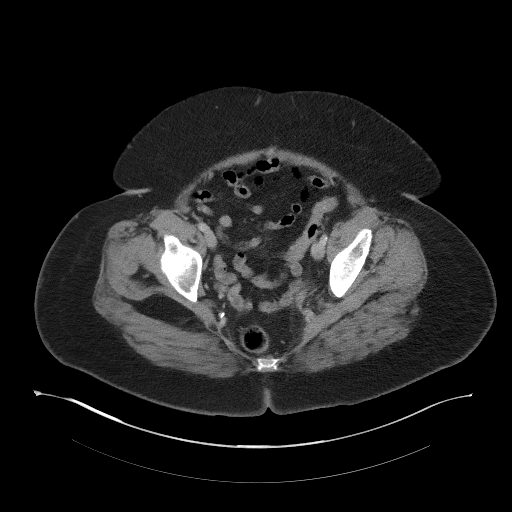
[im 43/107  soft-tissue]
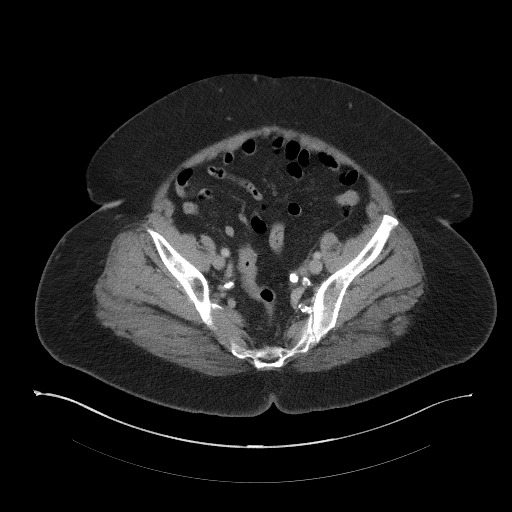
[im 48/107  soft-tissue]
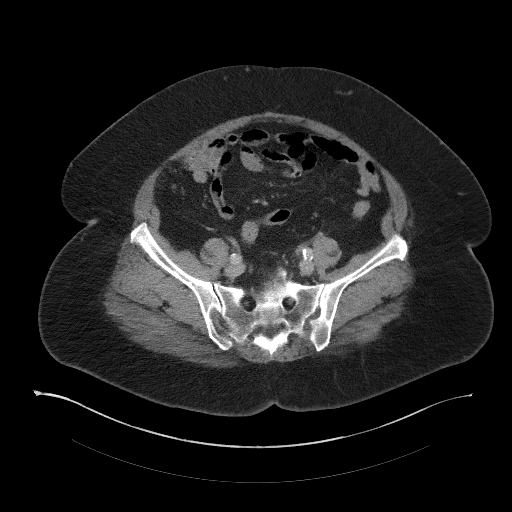
[im 59/107  soft-tissue]
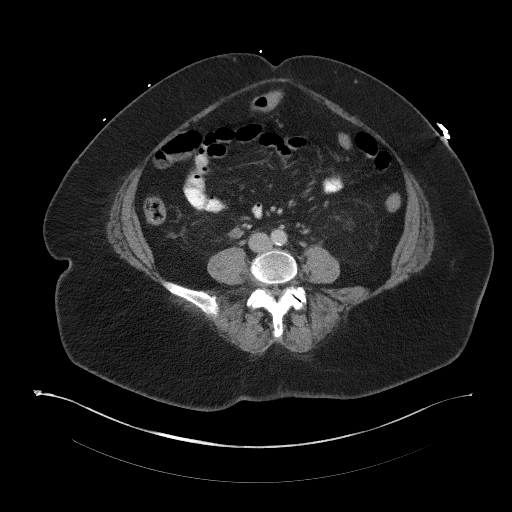
[im 64/107  soft-tissue]
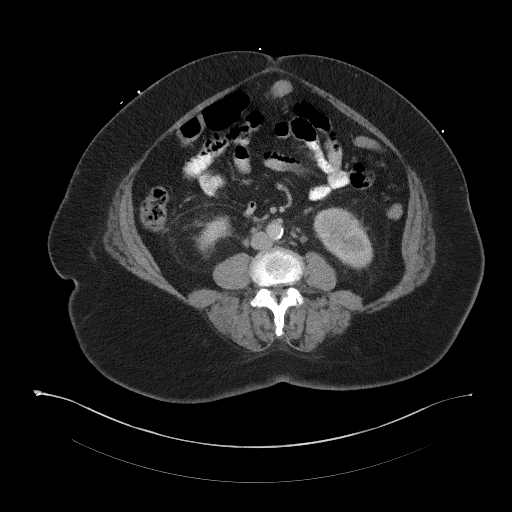
[im 64/107  bone]
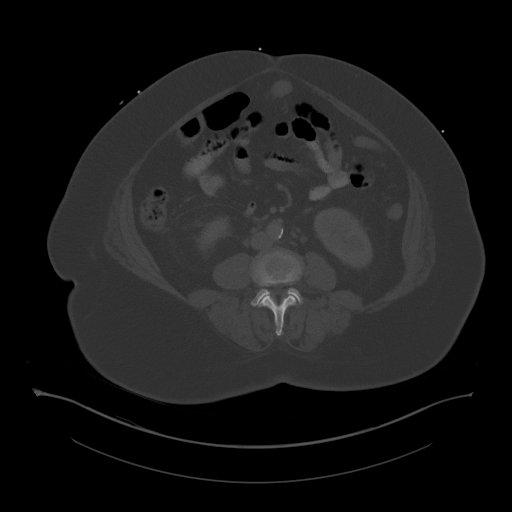
[im 69/107  soft-tissue]
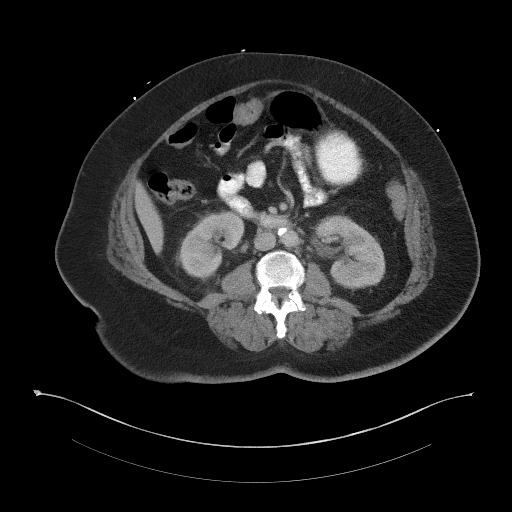
[im 80/107  soft-tissue]
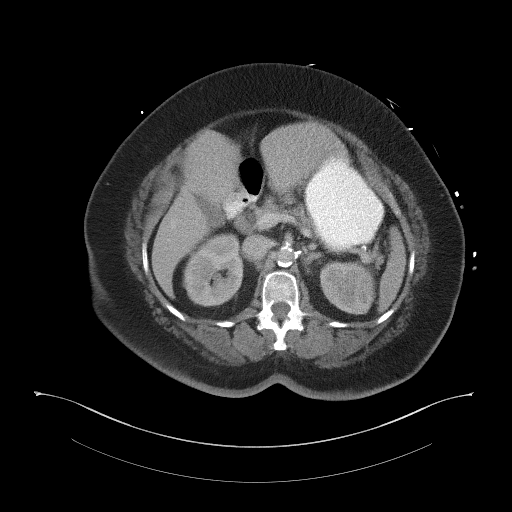
[im 85/107  soft-tissue]
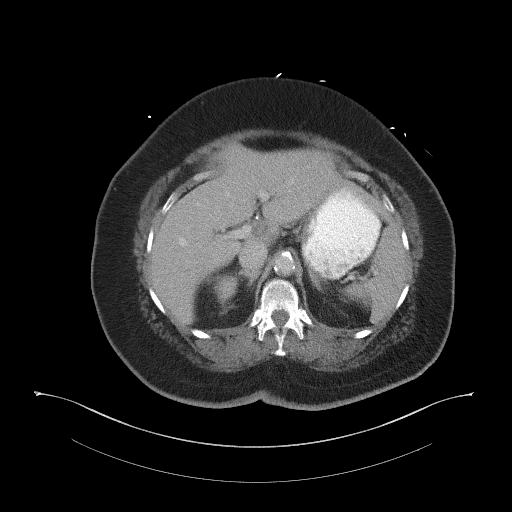
[im 91/107  soft-tissue]
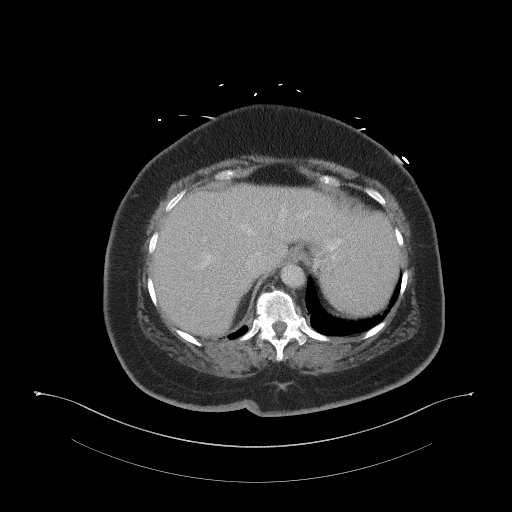
[im 101/107  soft-tissue]
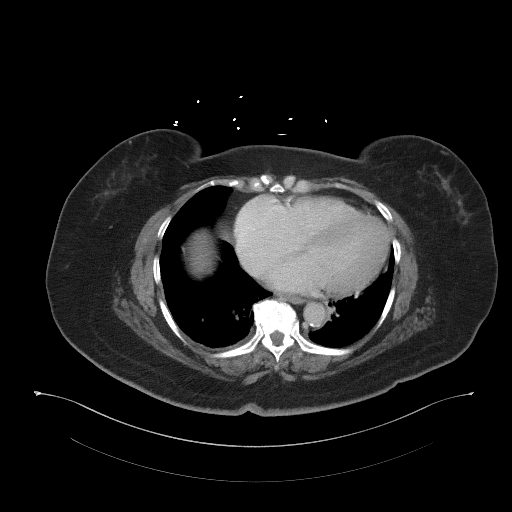

[Series 4: coronal st · coronal · 1.00mm/px · 3 of 107 slices shown]
[im 36/107  soft-tissue]
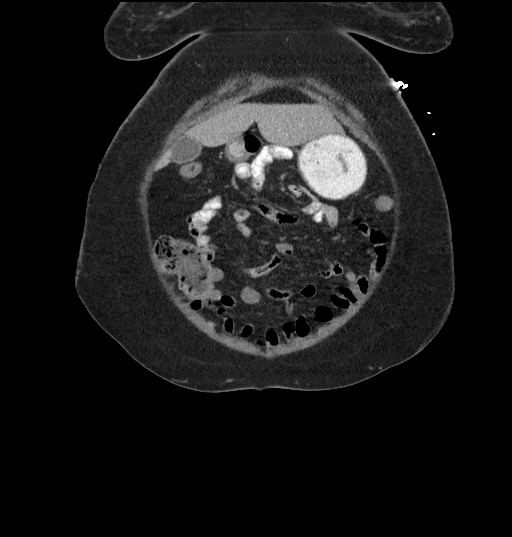
[im 48/107  soft-tissue]
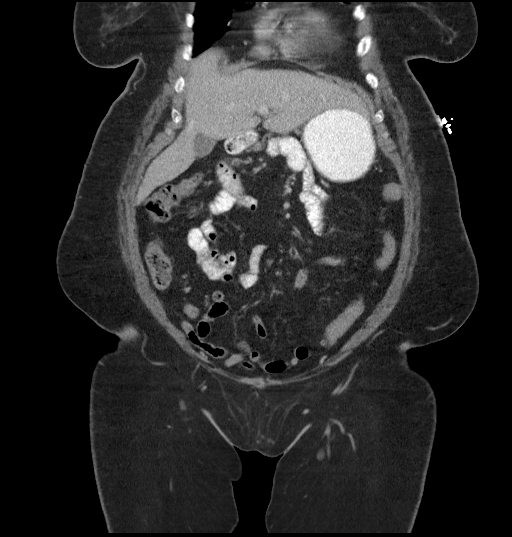
[im 59/107  soft-tissue]
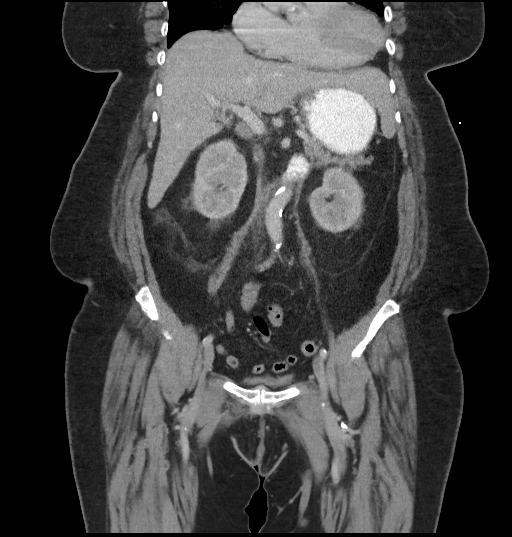

[17 of 46 positions shown; findings below may reference images not displayed]

FINDINGS: Lower chest: Lung bases are clear.

Hepatobiliary: No focal hepatic lesion. No biliary duct dilatation.
Gallbladder is normal. Common bile duct is normal.

Pancreas: Pancreas is normal. No ductal dilatation. No pancreatic
inflammation.

Spleen: Normal spleen

Adrenals/urinary tract: Adrenal glands are normal. There is mild
perinephric stranding bilateral. There is subtle enhancement of the
epithelium of the LEFT and RIGHT renal pelves (image 36, series 2
for example). Ureters and bladder normal.

Stomach/Bowel: Stomach, small-bowel, cecum normal. Appendix not
identified. Colon rectosigmoid colon are normal.

Vascular/Lymphatic: Abdominal aorta is normal caliber with
atherosclerotic calcification. There is no retroperitoneal or
periportal lymphadenopathy. No pelvic lymphadenopathy.

Reproductive: Post hysterectomy anatomy

Other: No free fluid.

Musculoskeletal: No aggressive osseous lesion.
IMPRESSION: 1. Enhancement of the epithelium of the LEFT and RIGHT renal pelves.
Recommend clinical correlation for urinary tract infections.
2. No additional evidence of infection in the abdomen pelvis.
3.  Aortic Atherosclerosis ([id number]).

## 2019-06-24 IMAGING — DX DG CHEST 1V PORT
1 series · 1 of 1 positions shown · non-contrast
Comparison: None.

CLINICAL DATA: Short of breath

EXAM:
PORTABLE CHEST 1 VIEW

[chest ap]
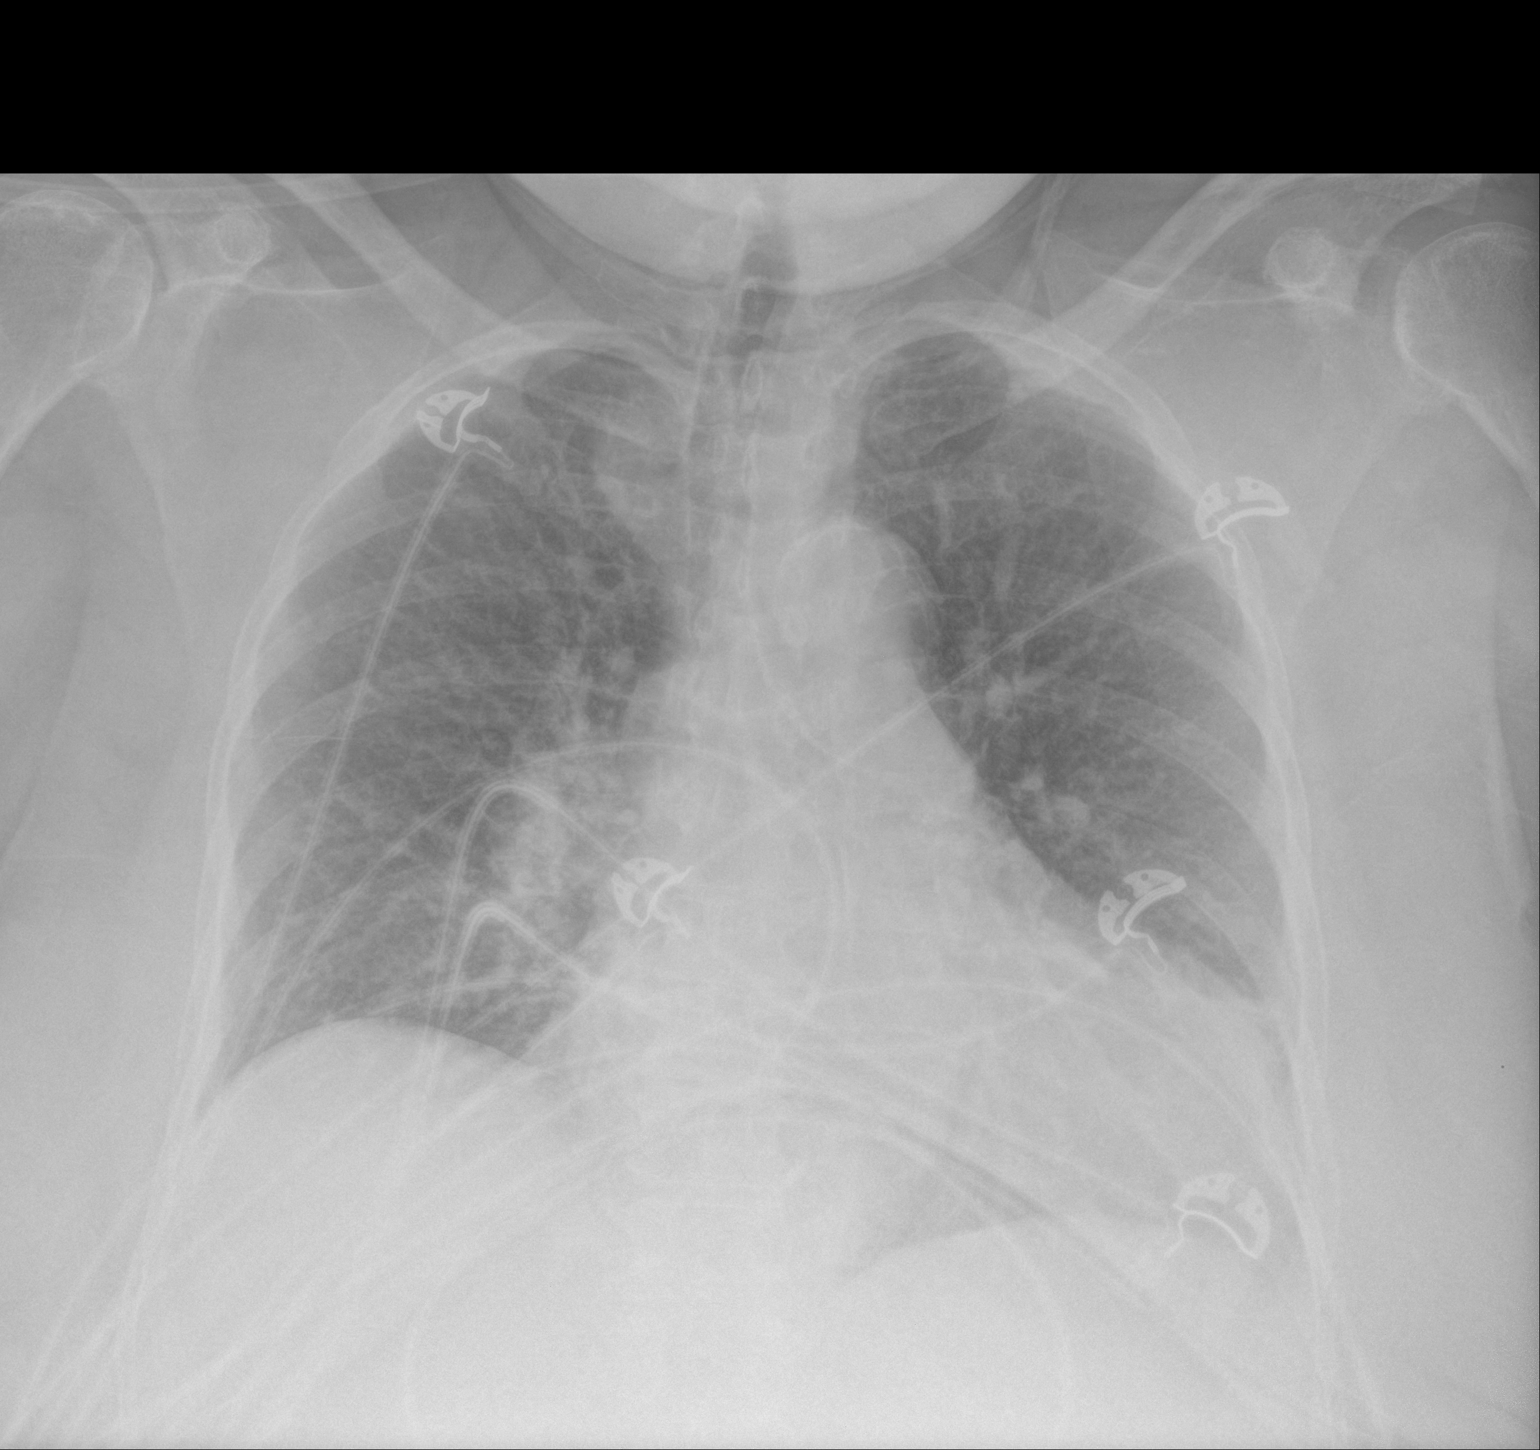

[1 of 1 positions shown; findings below may reference images not displayed]

FINDINGS: The stable large cardiac silhouette. There is central venous
pulmonary congestion. Mild peripheral interstitial edema. Vascular
shadow projecting over the medial aspect of the RIGHT upper lobe.
Airways normal. No pneumothorax.
IMPRESSION: Central venous congestion and mild interstitial edema.

## 2019-07-03 ENCOUNTER — Other Ambulatory Visit: Payer: Self-pay | Admitting: Adult Health

## 2019-07-06 NOTE — Telephone Encounter (Signed)
Sent to the pharmacy by e-scribe for 90 days. 

## 2019-08-19 ENCOUNTER — Other Ambulatory Visit: Payer: Self-pay

## 2019-08-19 DIAGNOSIS — Z20822 Contact with and (suspected) exposure to covid-19: Secondary | ICD-10-CM

## 2019-08-21 LAB — NOVEL CORONAVIRUS, NAA: SARS-CoV-2, NAA: NOT DETECTED

## 2019-11-09 ENCOUNTER — Ambulatory Visit: Payer: Medicare Other | Attending: Internal Medicine

## 2019-11-09 DIAGNOSIS — Z20822 Contact with and (suspected) exposure to covid-19: Secondary | ICD-10-CM

## 2019-11-11 LAB — NOVEL CORONAVIRUS, NAA: SARS-CoV-2, NAA: NOT DETECTED

## 2019-11-24 ENCOUNTER — Other Ambulatory Visit: Payer: Self-pay | Admitting: Adult Health

## 2019-11-24 DIAGNOSIS — Z760 Encounter for issue of repeat prescription: Secondary | ICD-10-CM

## 2019-12-01 ENCOUNTER — Other Ambulatory Visit: Payer: Self-pay | Admitting: Adult Health

## 2019-12-03 NOTE — Telephone Encounter (Signed)
Patient need to schedule an ov for more refills. 

## 2019-12-07 NOTE — Telephone Encounter (Signed)
Pt has been scheduled. Rx denied until appt. Tomorrow

## 2019-12-08 ENCOUNTER — Encounter: Payer: Self-pay | Admitting: Adult Health

## 2019-12-08 ENCOUNTER — Other Ambulatory Visit: Payer: Self-pay

## 2019-12-08 ENCOUNTER — Ambulatory Visit (INDEPENDENT_AMBULATORY_CARE_PROVIDER_SITE_OTHER): Payer: Medicare Other | Admitting: Adult Health

## 2019-12-08 VITALS — BP 140/80 | HR 78 | Temp 98.2°F | Ht 59.5 in | Wt 186.0 lb

## 2019-12-08 DIAGNOSIS — F0150 Vascular dementia without behavioral disturbance: Secondary | ICD-10-CM

## 2019-12-08 DIAGNOSIS — I10 Essential (primary) hypertension: Secondary | ICD-10-CM | POA: Diagnosis not present

## 2019-12-08 DIAGNOSIS — Z794 Long term (current) use of insulin: Secondary | ICD-10-CM

## 2019-12-08 DIAGNOSIS — E118 Type 2 diabetes mellitus with unspecified complications: Secondary | ICD-10-CM | POA: Diagnosis not present

## 2019-12-08 LAB — POCT GLYCOSYLATED HEMOGLOBIN (HGB A1C): Hemoglobin A1C: 7.5 % — AB (ref 4.0–5.6)

## 2019-12-08 MED ORDER — BASAGLAR KWIKPEN 100 UNIT/ML ~~LOC~~ SOPN: PEN_INJECTOR | SUBCUTANEOUS | 6 refills | Status: DC

## 2019-12-08 NOTE — Progress Notes (Signed)
Subjective:    Patient ID: Lynn Swanson, female    DOB: 13-Jun-1941, 79 y.o.   MRN: [id number]  HPI  79 year old female who  has a past medical history of Diabetes mellitus, Glaucoma, HSV (herpes simplex virus) infection, Hypertension, and Vascular dementia ([place]).   She presents with her daughter today for month follow-up regarding diabetes and hypertension.  Her diabetes is managed with Lantus 75 units nightly.  Her last A1c 6 months ago was 7.0.  Daughter does monitor her blood sugars at home and reports readings between 120 and 400 with most of them being in the 120s to 150s.  Her daughter is trying to keep her on a low-carb low sugar diet.  She denies any episodes of hypoglycemia.  Today her A1c has slightly increased to 7.5.  Her blood pressure is managed with Norvasc 5 mg.  He has been taking her medications on a routine basis.  She denies symptoms of tension such as dizziness, lightheadedness or syncopal episodes.  She also denies chest pain, shortness of breath or headaches.  She has a history of vascular dementia.  At times she is confused and mixed up family members names.  Her daughter does not feels that there have been any recent changes in her symptoms.  Continues to do well at [employer] [facility].   Review of Systems See HPI   Past Medical History:  Diagnosis Date  . Diabetes mellitus   . Glaucoma   . HSV (herpes simplex virus) infection   . Hypertension   . Vascular dementia [facility])     Social History   Socioeconomic History  . Marital status: Widowed    Spouse name: Not on file  . Number of children: Not on file  . Years of education: Not on file  . Highest education level: Not on file  Occupational History  . Not on file  Tobacco Use  . Smoking status: Never Smoker  . Smokeless tobacco: Former Systems developer    Types: Snuff  Substance and Sexual Activity  . Alcohol use: No    Alcohol/week: 0.0 standard drinks  . Drug use: No  . Sexual activity: Not on file    Other Topics Concern  . Not on file  Social History Narrative   Retired    Married, husband deceased in 2003/02/19 with kidney failure   -Three daughters, one lives in [place], one in [place], one [place]. Three sons, one is [place], one is in [place], one passes away.    Lives with daughter   No pets.    Likes to walk and go to the park.       Social Determinants of Health   Financial Resource Strain:   . Difficulty of Paying Living Expenses: Not on file  Food Insecurity:   . Worried About Charity fundraiser in the Last Year: Not on file  . Ran Out of Food in the Last Year: Not on file  Transportation Needs:   . Lack of Transportation (Medical): Not on file  . Lack of Transportation (Non-Medical): Not on file  Physical Activity:   . Days of Exercise per Week: Not on file  . Minutes of Exercise per Session: Not on file  Stress:   . Feeling of Stress : Not on file  Social Connections:   . Frequency of Communication with Friends and Family: Not on file  . Frequency of Social Gatherings with Friends and Family: Not on file  . Attends Religious Services: Not  on file  . Active Member of Clubs or Organizations: Not on file  . Attends Archivist Meetings: Not on file  . Marital Status: Not on file  Intimate Partner Violence:   . Fear of Current or Ex-Partner: Not on file  . Emotionally Abused: Not on file  . Physically Abused: Not on file  . Sexually Abused: Not on file    Past Surgical History:  Procedure Laterality Date  . ABDOMINAL HYSTERECTOMY      Family History  Problem Relation Age of Onset  . Diabetes Mother     No Known Allergies  Current Outpatient Medications on File Prior to Visit  Medication Sig Dispense Refill  . amLODipine (NORVASC) 5 MG tablet TAKE 1 TABLET BY MOUTH EVERY DAY 90 tablet 1  . brimonidine (ALPHAGAN P) 0.1 % SOLN Place 1 drop into both eyes 2 (two) times daily. 15 mL 0  . dorzolamide (TRUSOPT) 2 % ophthalmic solution PLACE 1  DROP IN BOTH EYES TWICE A DAY 10 mL 1  . glucose blood (TRUETRACK TEST) test strip USE TO TEST BLOOD SUGAR TWICE A DAY AS DIRECTED (DX 250.02) 180 each 3  . Insulin Pen Needle (B-D UF III MINI PEN NEEDLES) 31G X 5 MM MISC USE TO INJECT BASAGLAR ONCE DAILY 30 each 3  . LUMIGAN 0.01 % SOLN PLACE 1 DROP INTO BOTH EYES AT BEDTIME. 7.5 mL 3   No current facility-administered medications on file prior to visit.    BP 140/80   Pulse 78   Temp 98.2 F (36.8 C) (Other (Comment))   Ht 4' 11.5" (1.511 m)   Wt 186 lb (84.4 kg)   SpO2 96%   BMI 36.94 kg/m       Objective:   Physical Exam Vitals and nursing note reviewed.  Constitutional:      Appearance: Normal appearance.  Cardiovascular:     Rate and Rhythm: Normal rate and regular rhythm.     Pulses: Normal pulses.     Heart sounds: Normal heart sounds.  Pulmonary:     Effort: Pulmonary effort is normal.     Breath sounds: Normal breath sounds.  Abdominal:     General: Abdomen is flat.     Palpations: Abdomen is soft.  Skin:    General: Skin is warm and dry.     Capillary Refill: Capillary refill takes less than 2 seconds.  Neurological:     General: No focal deficit present.     Mental Status: She is alert and oriented to person, place, and time.  Psychiatric:        Mood and Affect: Mood normal.        Behavior: Behavior normal.        Thought Content: Thought content normal.        Judgment: Judgment normal.       Assessment & Plan:  1. Type 2 diabetes mellitus with complication, with long-term current use of insulin (HCC) -7.5.  Has increased.  No change in dosage of medication at this time due to age and comorbidities. - POC HgB A1c  2. Essential hypertension, benign -Continue with Norvasc 5 mg  3. Vascular dementia without behavioral disturbance (HCC) - No changes. Continue to monitor    Dorothyann Peng, NP

## 2019-12-14 ENCOUNTER — Telehealth: Payer: Self-pay | Admitting: Adult Health

## 2019-12-14 NOTE — Telephone Encounter (Signed)
Pt needs S[employer]il[employer] Safety Lancet 28g 1.44mm 100 in pack prescribed to her so she won't have to pay for it [id number]   Pharmacy: [facility] Fax: [phone]

## 2019-12-15 ENCOUNTER — Other Ambulatory Visit: Payer: Self-pay

## 2019-12-15 NOTE — Telephone Encounter (Addendum)
Rx sent to pharmacy. Pt daughter notified of update. Other lancet d/c.

## 2020-02-01 DIAGNOSIS — Z23 Encounter for immunization: Secondary | ICD-10-CM | POA: Diagnosis not present

## 2020-02-11 DIAGNOSIS — Z9189 Other specified personal risk factors, not elsewhere classified: Secondary | ICD-10-CM | POA: Diagnosis not present

## 2020-02-11 DIAGNOSIS — Z20828 Contact with and (suspected) exposure to other viral communicable diseases: Secondary | ICD-10-CM | POA: Diagnosis not present

## 2020-02-16 ENCOUNTER — Telehealth: Payer: Self-pay | Admitting: Adult Health

## 2020-02-16 NOTE — Telephone Encounter (Signed)
Form placed in Cory's folder

## 2020-02-16 NOTE — Telephone Encounter (Signed)
Pt's daughter, Ann Lions, dropped off [place] forms to be completed by the provider. Upon completion pt would like to be called at [phone] to pick form up. Placed in Cory's folder.

## 2020-02-17 NOTE — Telephone Encounter (Signed)
Maxine notified to pick up paper work at the front desk.

## 2020-02-19 ENCOUNTER — Other Ambulatory Visit: Payer: Self-pay | Admitting: Adult Health

## 2020-02-22 DIAGNOSIS — Z01419 Encounter for gynecological examination (general) (routine) without abnormal findings: Secondary | ICD-10-CM | POA: Diagnosis not present

## 2020-02-22 DIAGNOSIS — Z1231 Encounter for screening mammogram for malignant neoplasm of breast: Secondary | ICD-10-CM | POA: Diagnosis not present

## 2020-02-22 DIAGNOSIS — Z6832 Body mass index (BMI) 32.0-32.9, adult: Secondary | ICD-10-CM | POA: Diagnosis not present

## 2020-03-14 ENCOUNTER — Telehealth: Payer: Self-pay | Admitting: Adult Health

## 2020-03-14 NOTE — Chronic Care Management (AMB) (Signed)
  Chronic Care Management   Outreach Note  03/14/2020 Name: Lynn Swanson MRN: [id number] DOB: 05/27/1941  Referred by: Dorothyann Peng, NP Reason for referral : Chronic Care Management   An unsuccessful telephone outreach was attempted today. The patient was referred to the pharmacist for assistance with care management and care coordination.   Follow Up Plan:   [place]

## 2020-03-29 ENCOUNTER — Telehealth: Payer: Self-pay | Admitting: Adult Health

## 2020-03-29 NOTE — Progress Notes (Signed)
  Chronic Care Management   Note  03/29/2020 Name: Lynn Swanson MRN: [id number] DOB: [birth year]  Lynn Swanson is a 79 y.o. year old female who is a primary care patient of Dorothyann Peng, NP. I reached out to [facility] by phone today in response to a referral sent by Lynn Swanson's PCP, Dorothyann Peng, NP.   Lynn Swanson was given information about Chronic Care Management services today including:  1. CCM service includes personalized support from designated clinical staff supervised by her physician, including individualized plan of care and coordination with other care providers 2. 24/7 contact phone numbers for assistance for urgent and routine care needs. 3. Service will only be billed when office clinical staff spend 20 minutes or more in a month to coordinate care. 4. Only one practitioner may furnish and bill the service in a calendar month. 5. The patient may stop CCM services at any time (effective at the end of the month) by phone call to the office staff.   Patient agreed to services and verbal consent obtained.   This note is not being shared with the patient for the following reason: To respect privacy (The patient or proxy has requested that the information not be shared).   Follow up plan:  [place]

## 2020-03-29 NOTE — Progress Notes (Signed)
  Chronic Care Management   Outreach Note  03/29/2020 Name: Alieya Mcphillips MRN: [id number] DOB: 18-Sep-1941  Referred by: Dorothyann Peng, NP Reason for referral : No chief complaint on file.   A second unsuccessful telephone outreach was attempted today. The patient was referred to pharmacist for assistance with care management and care coordination.  This note is not being shared with the patient for the following reason: To respect privacy (The patient or proxy has requested that the information not be shared).   Follow Up Plan:   [place]

## 2020-04-25 ENCOUNTER — Encounter: Payer: Medicare Other | Admitting: Adult Health

## 2020-05-03 ENCOUNTER — Encounter: Payer: Self-pay | Admitting: Adult Health

## 2020-05-03 ENCOUNTER — Other Ambulatory Visit: Payer: Self-pay

## 2020-05-03 ENCOUNTER — Ambulatory Visit (INDEPENDENT_AMBULATORY_CARE_PROVIDER_SITE_OTHER): Payer: Medicare Other | Admitting: Adult Health

## 2020-05-03 VITALS — BP 130/74 | Temp 98.0°F | Ht 59.5 in | Wt 185.0 lb

## 2020-05-03 DIAGNOSIS — F0150 Vascular dementia without behavioral disturbance: Secondary | ICD-10-CM

## 2020-05-03 DIAGNOSIS — E118 Type 2 diabetes mellitus with unspecified complications: Secondary | ICD-10-CM

## 2020-05-03 DIAGNOSIS — I10 Essential (primary) hypertension: Secondary | ICD-10-CM | POA: Diagnosis not present

## 2020-05-03 DIAGNOSIS — E7800 Pure hypercholesterolemia, unspecified: Secondary | ICD-10-CM

## 2020-05-03 DIAGNOSIS — Z760 Encounter for issue of repeat prescription: Secondary | ICD-10-CM

## 2020-05-03 DIAGNOSIS — Z794 Long term (current) use of insulin: Secondary | ICD-10-CM

## 2020-05-03 LAB — COMPREHENSIVE METABOLIC PANEL
ALT: 13 U/L (ref 0–35)
AST: 12 U/L (ref 0–37)
Albumin: 3.7 g/dL (ref 3.5–5.2)
Alkaline Phosphatase: 76 U/L (ref 39–117)
BUN: 12 mg/dL (ref 6–23)
CO2: 28 mEq/L (ref 19–32)
Calcium: 9.2 mg/dL (ref 8.4–10.5)
Chloride: 107 mEq/L (ref 96–112)
Creatinine, Ser: 0.62 mg/dL (ref 0.40–1.20)
GFR: 112.06 mL/min (ref >60.00)
Glucose, Bld: 116 mg/dL — ABNORMAL HIGH (ref 70–99)
Potassium: 4 mEq/L (ref 3.5–5.1)
Sodium: 141 mEq/L (ref 135–145)
Total Bilirubin: 0.3 mg/dL (ref 0.2–1.2)
Total Protein: 6.6 g/dL (ref 6.0–8.3)

## 2020-05-03 LAB — LIPID PANEL
Cholesterol: 181 mg/dL (ref 0–200)
HDL: 36.8 mg/dL — ABNORMAL LOW (ref >39.00)
LDL Cholesterol: 129 mg/dL — ABNORMAL HIGH (ref 0–99)
NonHDL: 144.42
Total CHOL/HDL Ratio: 5
Triglycerides: 76 mg/dL (ref 0.0–149.0)
VLDL: 15.2 mg/dL (ref 0.0–40.0)

## 2020-05-03 LAB — CBC WITH DIFFERENTIAL/PLATELET
Basophils Absolute: 0 10*3/uL (ref 0.0–0.1)
Basophils Relative: 0.8 % (ref 0.0–3.0)
Eosinophils Absolute: 0.1 10*3/uL (ref 0.0–0.7)
Eosinophils Relative: 1.6 % (ref 0.0–5.0)
HCT: 36.2 % (ref 36.0–46.0)
Hemoglobin: 12 g/dL (ref 12.0–15.0)
Lymphocytes Relative: 37 % (ref 12.0–46.0)
Lymphs Abs: 1.8 10*3/uL (ref 0.7–4.0)
MCHC: 33.1 g/dL (ref 30.0–36.0)
MCV: 89.3 fl (ref 78.0–100.0)
Monocytes Absolute: 0.4 10*3/uL (ref 0.1–1.0)
Monocytes Relative: 8.3 % (ref 3.0–12.0)
Neutro Abs: 2.5 10*3/uL (ref 1.4–7.7)
Neutrophils Relative %: 52.3 % (ref 43.0–77.0)
Platelets: 227 10*3/uL (ref 150.0–400.0)
RBC: 4.06 Mil/uL (ref 3.87–5.11)
RDW: 14.6 % (ref 11.5–15.5)
WBC: 4.8 10*3/uL (ref 4.0–10.5)

## 2020-05-03 LAB — HEMOGLOBIN A1C: Hgb A1c MFr Bld: 8.2 % — ABNORMAL HIGH (ref 4.6–6.5)

## 2020-05-03 LAB — TSH: TSH: 1.84 u[IU]/mL (ref 0.35–4.50)

## 2020-05-03 MED ORDER — AMLODIPINE BESYLATE 5 MG PO TABS: 5.0000 mg | ORAL_TABLET | Freq: Every day | ORAL | 3 refills | Status: DC

## 2020-05-03 MED ORDER — GLIPIZIDE ER 2.5 MG PO TB24: 2.5000 mg | ORAL_TABLET | Freq: Every day | ORAL | 3 refills | Status: AC

## 2020-05-03 NOTE — Progress Notes (Signed)
Subjective:    Patient ID: Lynn Swanson, female    DOB: October 01, 1941, 79 y.o.   MRN: [id number]  HPI  Patient presents for yearly preventative medicine examination. She is a pleasant 79 year old female who  has a past medical history of Diabetes mellitus, Glaucoma, HSV (herpes simplex virus) infection, Hypertension, and Vascular dementia ([place]).  Essential hypertension-controlled with Norvasc 5 mg daily.  She denies dizziness, lightheadedness, chest pain, or shortness of breath  DM -currently well managed with Basaglar 75 units nightly.  She does check her blood sugars at home and reports readings consistently between 120 and 140.  She denies any episodes of hypoglycemia  Lab Results  Component Value Date   HGBA1C 7.5 (A) 12/08/2019   Hyperlipidemia -has taken Zocor 20 mg in the past had no side effects but stopped taking this medication greater than 6 months ago.  Lab Results  Component Value Date   CHOL 173 04/22/2019   HDL 40.20 04/22/2019   LDLCALC 115 (H) 04/22/2019   TRIG 88.0 04/22/2019   CHOLHDL 4 04/22/2019   Vascular Dementia -was diagnosed in [place] 2019.  She does get confused at times and mixes up family members names but does not have any hallucinations.  She does sleep well and her appetite is good.  Family with her today reports no changes in memory. She is going to an adult dare care during the week and enjoys this.   All immunizations and health maintenance protocols were reviewed with the patient and needed orders were placed.  Appropriate screening laboratory values were ordered for the patient including screening of hyperlipidemia, renal function and hepatic function.   Medication reconciliation,  past medical history, social history, problem list and allergies were reviewed in detail with the patient  Goals were established with regard to weight loss, exercise, and  diet in compliance with medications  Wt Readings from Last 3 Encounters:  05/03/20 185 lb  (83.9 kg)  12/08/19 186 lb (84.4 kg)  04/22/19 186 lb (84.4 kg)    End of life planning was discussed.  She has an advanced directive and living will  Review of Systems  Constitutional: Negative.   HENT: Negative.   Eyes: Negative.   Respiratory: Negative.   Cardiovascular: Negative.   Gastrointestinal: Negative.   Endocrine: Negative.   Genitourinary: Negative.   Musculoskeletal: Negative.   Skin: Negative.   Allergic/Immunologic: Negative.   Neurological: Negative.   Hematological: Negative.   Psychiatric/Behavioral: Positive for confusion.   Past Medical History:  Diagnosis Date   Diabetes mellitus    Glaucoma    HSV (herpes simplex virus) infection    Hypertension    Vascular dementia ([place])     Social History   Socioeconomic History   Marital status: Widowed    Spouse name: Not on file   Number of children: Not on file   Years of education: Not on file   Highest education level: Not on file  Occupational History   Not on file  Tobacco Use   Smoking status: Never Smoker   Smokeless tobacco: Former Systems developer    Types: Snuff  Substance and Sexual Activity   Alcohol use: No    Alcohol/week: 0.0 standard drinks   Drug use: No   Sexual activity: Not on file  Other Topics Concern   Not on file  Social History Narrative   Retired    Married, husband deceased in Feb 07, 2003 with kidney failure   -Three daughters, one lives in [place], one  in [place], one [place]. Three sons, one is [place], one is in [place], one passes away.    Lives with daughter   No pets.    Likes to walk and go to the park.       Social Determinants of Health   Financial Resource Strain:    Difficulty of Paying Living Expenses:   Food Insecurity:    Worried About Charity fundraiser in the Last Year:    Arboriculturist in the Last Year:   Transportation Needs:    Film/video editor (Medical):    Lack of Transportation (Non-Medical):   Physical Activity:     Days of Exercise per Week:    Minutes of Exercise per Session:   Stress:    Feeling of Stress :   Social Connections:    Frequency of Communication with Friends and Family:    Frequency of Social Gatherings with Friends and Family:    Attends Religious Services:    Active Member of Clubs or Organizations:    Attends Music therapist:    Marital Status:   Intimate Partner Violence:    Fear of Current or Ex-Partner:    Emotionally Abused:    Physically Abused:    Sexually Abused:     Past Surgical History:  Procedure Laterality Date   ABDOMINAL HYSTERECTOMY      Family History  Problem Relation Age of Onset   Diabetes Mother     No Known Allergies  Current Outpatient Medications on File Prior to Visit  Medication Sig Dispense Refill   B-D UF III MINI PEN NEEDLES 31G X 5 MM MISC USE TO INJECT BASAGLAR ONCE DAILY 90 each 3   brimonidine (ALPHAGAN P) 0.1 % SOLN Place 1 drop into both eyes 2 (two) times daily. 15 mL 0   dorzolamide (TRUSOPT) 2 % ophthalmic solution PLACE 1 DROP IN BOTH EYES TWICE A DAY 10 mL 1   glucose blood (TRUETRACK TEST) test strip USE TO TEST BLOOD SUGAR TWICE A DAY AS DIRECTED (DX 250.02) 180 each 3   Insulin Glargine (BASAGLAR KWIKPEN) 100 UNIT/ML SOPN INJECT 75 UNITS TOTAL INTO THE SKIN AT BEDTIME. 60 mL 6   LUMIGAN 0.01 % SOLN PLACE 1 DROP INTO BOTH EYES AT BEDTIME. 7.5 mL 3   No current facility-administered medications on file prior to visit.    BP 130/74    Temp 98 F (36.7 C)    Ht 4' 11.5" (1.511 m)    Wt 185 lb (83.9 kg)    BMI 36.74 kg/m       Objective:   Physical Exam Vitals and nursing note reviewed.  Constitutional:      General: She is not in acute distress.    Appearance: Normal appearance. She is well-developed. She is obese. She is not ill-appearing.  HENT:     Head: Normocephalic and atraumatic.     Right Ear: Tympanic membrane, ear canal and external ear normal. There is no impacted cerumen.      Left Ear: Tympanic membrane, ear canal and external ear normal. There is no impacted cerumen.     Nose: Nose normal. No congestion or rhinorrhea.     Mouth/Throat:     Mouth: Mucous membranes are moist.     Dentition: Abnormal dentition.     Pharynx: Oropharynx is clear. No oropharyngeal exudate or posterior oropharyngeal erythema.  Eyes:     General:        Right eye: No  discharge.        Left eye: No discharge.     Extraocular Movements: Extraocular movements intact.     Conjunctiva/sclera: Conjunctivae normal.     Pupils: Pupils are equal, round, and reactive to light.  Neck:     Thyroid: No thyromegaly.     Vascular: No carotid bruit.     Trachea: No tracheal deviation.  Cardiovascular:     Rate and Rhythm: Normal rate and regular rhythm.     Pulses: Normal pulses.     Heart sounds: Normal heart sounds. No murmur heard.  No friction rub. No gallop.   Pulmonary:     Effort: Pulmonary effort is normal. No respiratory distress.     Breath sounds: Normal breath sounds. No stridor. No wheezing, rhonchi or rales.  Chest:     Chest wall: No tenderness.  Abdominal:     General: Abdomen is flat. Bowel sounds are normal. There is no distension.     Palpations: Abdomen is soft. There is no mass.     Tenderness: There is no abdominal tenderness. There is no right CVA tenderness, left CVA tenderness, guarding or rebound.     Hernia: No hernia is present.  Musculoskeletal:        General: No swelling, tenderness, deformity or signs of injury. Normal range of motion.     Cervical back: Normal range of motion and neck supple.     Right lower leg: No edema.     Left lower leg: No edema.  Lymphadenopathy:     Cervical: No cervical adenopathy.  Skin:    General: Skin is warm and dry.     Coloration: Skin is not jaundiced or pale.     Findings: No bruising, erythema, lesion or rash.  Neurological:     General: No focal deficit present.     Mental Status: She is alert and oriented to  person, place, and time.     Cranial Nerves: No cranial nerve deficit.     Sensory: No sensory deficit.     Motor: No weakness.     Coordination: Coordination normal.     Gait: Gait normal.     Deep Tendon Reflexes: Reflexes normal.  Psychiatric:        Mood and Affect: Mood normal.        Behavior: Behavior normal.        Thought Content: Thought content normal.        Judgment: Judgment normal.       Assessment & Plan:  1. Essential hypertension, benign - BP well controlled.  - CBC with Differential/Platelet - Comprehensive metabolic panel - Hemoglobin A1c - Lipid panel - TSH - amLODipine (NORVASC) 5 MG tablet; Take 1 tablet (5 mg total) by mouth daily.  Dispense: 90 tablet; Refill: 3  2. Type 2 diabetes mellitus with complication, with long-term current use of insulin ([place]) - Consider increasing DM  - CBC with Differential/Platelet - Comprehensive metabolic panel - Hemoglobin A1c - Lipid panel - TSH  3. Vascular dementia without behavioral disturbance ([place]) - Watchful waiting  - CBC with Differential/Platelet - Comprehensive metabolic panel - Hemoglobin A1c - Lipid panel - TSH  4. Hypercholesteremia - Consider adding statin back  - CBC with Differential/Platelet - Comprehensive metabolic panel - Hemoglobin A1c - Lipid panel - TSH  Dorothyann Peng, NP

## 2020-05-03 NOTE — Patient Instructions (Addendum)
It was great seeing you today   We will follow up with you regarding your blood work   Please follow up in 6 months 

## 2020-05-09 ENCOUNTER — Other Ambulatory Visit: Payer: Self-pay | Admitting: Adult Health

## 2020-05-09 DIAGNOSIS — I10 Essential (primary) hypertension: Secondary | ICD-10-CM

## 2020-05-09 DIAGNOSIS — E118 Type 2 diabetes mellitus with unspecified complications: Secondary | ICD-10-CM

## 2020-05-09 DIAGNOSIS — E7800 Pure hypercholesterolemia, unspecified: Secondary | ICD-10-CM

## 2020-05-09 NOTE — Chronic Care Management (AMB) (Deleted)
Chronic Care Management Pharmacy  Name: Lynn Swanson  MRN: [id number] DOB: 1941/08/07  Chief Complaint/ HPI  Lynn Swanson,  79 y.o. , female presents for their {Initial/Follow-up:3041532} CCM visit with the clinical pharmacist {CHL HP Upstream Pharm visit DZHG:[id number]}.  PCP : Dorothyann Peng, NP  Their chronic conditions include: HTN, hypercholesterolemia, DM II  *** 5/11  Office Visits: 05/03/20 OV - Annual exam. Patient stable and well controlled. No changes with medications.  Consult Visit: None  Medications: Outpatient Encounter Medications as of 05/10/2020  Medication Sig  . amLODipine (NORVASC) 5 MG tablet Take 1 tablet (5 mg total) by mouth daily.  . B-D UF III MINI PEN NEEDLES 31G X 5 MM MISC USE TO INJECT BASAGLAR ONCE DAILY  . brimonidine (ALPHAGAN P) 0.1 % SOLN Place 1 drop into both eyes 2 (two) times daily.  . dorzolamide (TRUSOPT) 2 % ophthalmic solution PLACE 1 DROP IN BOTH EYES TWICE A DAY  . glipiZIDE (GLUCOTROL XL) 2.5 MG 24 hr tablet Take 1 tablet (2.5 mg total) by mouth daily with breakfast.  . glucose blood (TRUETRACK TEST) test strip USE TO TEST BLOOD SUGAR TWICE A DAY AS DIRECTED (DX 250.02)  . Insulin Glargine (BASAGLAR KWIKPEN) 100 UNIT/ML SOPN INJECT 75 UNITS TOTAL INTO THE SKIN AT BEDTIME.  Marland Kitchen LUMIGAN 0.01 % SOLN PLACE 1 DROP INTO BOTH EYES AT BEDTIME.   No facility-administered encounter medications on file as of 05/10/2020.     Current Diagnosis/Assessment:  Goals Addressed   None     {CHL HP Upstream Pharmacy Diagnosis/Assessment:[id number]}  Hypertension   Office blood pressures are  BP Readings from Last 3 Encounters:  05/03/20 130/74  12/08/19 140/80  04/22/19 (!) 166/82    Patient has failed these meds in the past: amlodipine, carvedilol, valsartan/hctz Patient is currently {CHL Controlled/Uncontrolled:[phone]} on the following medications:  . Amlodipine 5 mg 1 tablet daily  Patient checks BP at home {CHL HP BP Monitoring  Frequency:[id number]}  Patient home BP readings are ranging: ***  We discussed {CHL HP Upstream Pharmacy discussion:[phone]}  Plan  Continue {CHL HP Upstream Pharmacy Plans:[id number]}      Hyperlipidemia   Lipid Panel     Component Value Date/Time   CHOL 181 05/03/2020 0814   TRIG 76.0 05/03/2020 0814   HDL 36.80 (L) 05/03/2020 0814   CHOLHDL 5 05/03/2020 0814   VLDL 15.2 05/03/2020 0814   LDLCALC 129 (H) 05/03/2020 0814     The ASCVD Risk score (Goff DC Jr., et al., 2013) failed to calculate for the following reasons:   The 2013 ASCVD risk score is only valid for ages 33 to 68   Patient has failed these meds in past: Simvastatin Patient is currently {CHL Controlled/Uncontrolled:[phone]} on the following medications:  . None  We discussed:  {CHL HP Upstream Pharmacy discussion:[phone]}  Plan  Continue {CHL HP Upstream Pharmacy Plans:3188577169}    Diabetes   Recent Relevant Labs: Lab Results  Component Value Date/Time   HGBA1C 8.2 (H) 05/03/2020 08:14 AM   HGBA1C 7.5 (A) 12/08/2019 03:31 PM   HGBA1C 7.0 (H) 04/22/2019 08:25 AM   HGBA1C 6.1 11/12/2018 10:09 AM   GFR 112.06 05/03/2020 08:14 AM   GFR 102.73 04/22/2019 08:25 AM   MICROALBUR 0.3 01/25/2014 09:15 AM     Checking BG: {CHL HP Blood Glucose Monitoring Frequency:[phone]}  Recent FBG Readings: *** Recent pre-meal BG readings: *** Recent 2hr PP BG readings:  *** Recent HS BG readings: ***  Patient has failed these meds in past:  Lantus, Januvia, pioglitazone Patient is currently {CHL Controlled/[phone]} on the following medications:  Marland Kitchen Glipizide 2.5 mg 1 tablet daily with breakfast . Basaglar Kwikpen 75 units at bedtime  Last diabetic Eye exam: No results found for: HMDIABEYEEXA  Last diabetic Foot exam: No results found for: HMDIABFOOTEX   We discussed: {CHL HP Upstream Pharmacy discussion:[phone]}  Plan  Continue {CHL HP Upstream Pharmacy TJQZE:[id number]}      Vascular Dementia   Patient has failed these meds in past: *** Patient is currently {CHL Controlled/[phone]} on the following medications:  . None  We discussed:  ***  Plan  Continue {CHL HP Upstream Pharmacy [phone]}    Health Maintenance   Patient is currently {CHL Controlled/[phone]} on the following medications:  Marland Kitchen Dorzolamide 2% opth 1 drop in both eyes twice daily . Lumigan 0.01% opth 1 drop into both eyes at bedtime . Brimonidine 0.1% opth 1 drop into both eyes twice daily  We discussed:  ***  Plan  Continue {CHL HP Upstream Pharmacy UQJFH:[id number]}   Vaccines   Reviewed and discussed patient's vaccination history.    Immunization History  Administered Date(s) Administered  . Influenza Split 07/10/2012  . Influenza, High Dose Seasonal PF 09/27/2013, 10/05/2014, 09/02/2017, 11/12/2018  . Moderna SARS-COVID-2 Vaccination 02/01/2020  . Pneumococcal Conjugate-13 10/05/2014  . Pneumococcal Polysaccharide-23 02/02/2016  . Td 01/25/2014    Plan  Recommended patient receive shingrix vaccine in the office or pharmacy.   Medication Management   Pharmacy/Benefits: [facility] Adherence:  Pt endorses ***% compliance  We discussed: ***  Plan  {US Pharmacy HTDS:28768}   Follow up: *** month phone visit   Geraldine Contras, PharmD Clinical Pharmacist [facility] [place] [phone]

## 2020-05-10 ENCOUNTER — Telehealth: Payer: Medicare Other

## 2020-07-11 ENCOUNTER — Other Ambulatory Visit: Payer: Self-pay | Admitting: Adult Health

## 2020-07-11 ENCOUNTER — Telehealth: Payer: Self-pay

## 2020-07-11 DIAGNOSIS — I10 Essential (primary) hypertension: Secondary | ICD-10-CM

## 2020-07-11 DIAGNOSIS — E7800 Pure hypercholesterolemia, unspecified: Secondary | ICD-10-CM

## 2020-07-11 NOTE — Chronic Care Management (AMB) (Signed)
Chronic Care Management Pharmacy  Name: Lynn Swanson  MRN: [id number] DOB: [birth year]   Chief Complaint/ HPI  Lynn Swanson,  79 y.o. , female presents for their Initial CCM visit with the clinical pharmacist via telephone. I spoke with the patient's daughter Lynn Swanson, who helps take care of her mother and manages her medications. The patient stays at adult daycare during the week while [place] works.   PCP : Dorothyann Peng, NP Patient Care Team: Dorothyann Peng, NP as PCP - General (Family Medicine) Germaine Pomfret, [facility] as Pharmacist (Pharmacist)  Their chronic conditions include: Hypertension, Hyperlipidemia and Diabetes   Office Visits: 05/03/20: Patient presented to Dorothyann Peng, NP for follow-up. A1c worsened to 8.2%, patient started on glipzide XL 2.5 mg daily   Consult Visit: 02/22/20: Patient presented to Dr. Julien Girt for mammogram screening.   No Known Allergies  Medications: Outpatient Encounter Medications as of 07/12/2020  Medication Sig  . amLODipine (NORVASC) 5 MG tablet Take 1 tablet (5 mg total) by mouth daily.  . brimonidine (ALPHAGAN P) 0.1 % SOLN Place 1 drop into both eyes 2 (two) times daily.  . dorzolamide (TRUSOPT) 2 % ophthalmic solution PLACE 1 DROP IN BOTH EYES TWICE A DAY  . Insulin Glargine (BASAGLAR KWIKPEN) 100 UNIT/ML SOPN INJECT 75 UNITS TOTAL INTO THE SKIN AT BEDTIME.  Marland Kitchen LUMIGAN 0.01 % SOLN PLACE 1 DROP INTO BOTH EYES AT BEDTIME.  Marland Kitchen B-D UF III MINI PEN NEEDLES 31G X 5 MM MISC USE TO INJECT BASAGLAR ONCE DAILY  . glipiZIDE (GLUCOTROL XL) 2.5 MG 24 hr tablet Take 1 tablet (2.5 mg total) by mouth daily with breakfast. (Patient not taking: Reported on 07/12/2020)  . glucose blood (TRUETRACK TEST) test strip USE TO TEST BLOOD SUGAR TWICE A DAY AS DIRECTED (DX 250.02)   No facility-administered encounter medications on file as of 07/12/2020.     Current Diagnosis/Assessment:  SDOH Interventions     Most Recent Value  SDOH Interventions  Financial  Strain Interventions Intervention Not Indicated  Transportation Interventions Intervention Not Indicated      Goals Addressed            This Visit's Progress   . Chronic Care Management       CARE PLAN ENTRY  Current Barriers:  . Chronic Disease Management support, education, and care coordination needs related to Hypertension, Hyperlipidemia and Diabetes    Hypertension BP Readings from Last 3 Encounters:  05/03/20 130/74  12/08/19 140/80  04/22/19 (!) 166/82   . Pharmacist Clinical Goal(s): o Over the next 90 days, patient will work with PharmD and providers to maintain BP goal <140/90 . Current regimen:  o Amlodipine 5 mg 1 tablet daily . Interventions: o Discussed low salt diet and exercising as tolerated extensively . Patient self care activities - Over the next 90 days, patient will: o Check blood pressure once weekly, document, and provide at future appointments o Ensure daily salt intake < 2300 mg/day  Hyperlipidemia Lab Results  Component Value Date/Time   LDLCALC 129 (H) 05/03/2020 08:14 AM   . Pharmacist Clinical Goal(s): o Over the next 90 days, patient will work with PharmD and providers to achieve LDL goal < 70 . Current regimen:  o None . Interventions: o Discussed low cholesterol diet and exercising as tolerated extensively  Diabetes Lab Results  Component Value Date/Time   HGBA1C 8.2 (H) 05/03/2020 08:14 AM   HGBA1C 7.5 (A) 12/08/2019 03:31 PM   HGBA1C 7.0 (H) 04/22/2019 08:25 AM  HGBA1C 6.1 11/12/2018 10:09 AM   . Pharmacist Clinical Goal(s): o Over the next 90 days, patient will work with PharmD and providers to achieve A1c goal <8% . Current regimen:  . Basaglar Kwikpen 75 units at bedtime . Interventions: o Discussed carbohydrate counting and exercising as tolerated extensively . Patient self care activities - Over the next 90 days, patient will: o Check blood sugar once daily, document, and provide at future appointments o Contact  provider with any episodes of hypoglycemia  Medication management . Pharmacist Clinical Goal(s): o Over the next 90 days, patient will work with PharmD and providers to maintain optimal medication adherence . [facility] . Interventions o Comprehensive medication review performed. o Continue current medication management strategy . Patient self care activities - Over the next 90 days, patient will: o Take medications as prescribed o Report any questions or concerns to PharmD and/or provider(s)      Diabetes   A1c goal <8%  Recent Relevant Labs: Lab Results  Component Value Date/Time   HGBA1C 8.2 (H) 05/03/2020 08:14 AM   HGBA1C 7.5 (A) 12/08/2019 03:31 PM   HGBA1C 7.0 (H) 04/22/2019 08:25 AM   HGBA1C 6.1 11/12/2018 10:09 AM   GFR 112.06 05/03/2020 08:14 AM   GFR 102.73 04/22/2019 08:25 AM   MICROALBUR 0.3 01/25/2014 09:15 AM    Last diabetic Eye exam: No results found for: HMDIABEYEEXA  Last diabetic Foot exam: No results found for: HMDIABFOOTEX   Checking BG: Daily  On 07/10/2020 it was 260             On 07/09/2020 it was 155             On 07/08/2020 it was 327             On 07/07/2020 it was 144             On 07/06/2020 it was 261    Average 229 Patient has failed these meds in past:  Patient is currently uncontrolled on the following medications: . Basaglar 75 units bedtime (0.89 u/kg) - Gives between 50-75 units depending on blood sugars   We discussed: Patient was prescribed glipizide at last PCP visit, but never started it. Patient's daughter states that she did not feel her mother needed the medication and preferred to focus on dietary changes. Patient's daughter feels spikes tended to happen when patient would "sneak" a soda or late night snack and wants to try and stop her mother from continuing her bad habits.   We discussed that Basal insulin alone is not an ideal regimen and is likely not covering patient from prandial spikes. We also discussed  GLP-1 Agonists as a potential add on for prandial coverage instead of glipizide but patient's daughter uninterested at this time.   Increasing basal insulin further will likely not improve blood sugar control and would only worsen likelyhood of hypoglycemia  Plan  Continue current medications  CMA assessment in one month to re-assess dietary changes and blood sugars.   Hypertension   BP goal is:  <140/90  Office blood pressures are  BP Readings from Last 3 Encounters:  05/03/20 130/74  12/08/19 140/80  04/22/19 (!) 166/82   Patient checks BP at home Never Patient home BP readings are ranging: n/a  Patient has failed these meds in the past: n/a Patient is currently controlled on the following medications:  . Amlodipine 5 mg daily (AM)   We discussed diet and exercise extensively. Patient's daughter tries  to cook Baked Chicken or Fish, with a variety of vegetables.   Plan  Continue current medications   Hyperlipidemia   LDL goal < 100  Lipid Panel     Component Value Date/Time   CHOL 181 05/03/2020 0814   TRIG 76.0 05/03/2020 0814   HDL 36.80 (L) 05/03/2020 0814   LDLCALC 129 (H) 05/03/2020 0814    Hepatic Function Latest Ref Rng & Units 05/03/2020 04/22/2019 02/04/2018  Total Protein 6.0 - 8.3 g/dL 6.6 6.5 6.8  Albumin 3.5 - 5.2 g/dL 3.7 3.8 3.3(L)  AST 0 - 37 U/L _0 ALT 0 - 35 U/L _1 Alk Phosphatase 39 - 117 U/L 76 81 79  Total Bilirubin 0.2 - 1.2 mg/dL 0.3 0.3 0.2  Bilirubin, Direct 0.0 - 0.3 mg/dL - - 0.0     The ASCVD Risk score ([place]., et al., 2013) failed to calculate for the following reasons:   The 2013 ASCVD risk score is only valid for ages 29 to 4   Patient has failed these meds in past: n/a Patient is currently uncontrolled on the following medications:  . None   We discussed:  diet and exercise extensively.   Plan  Continue current medications  Misc / OTC    . Brimonidine 0.1% 1 drop both eyes BID . Dorzolamide 2% 1  drop both eyes BID   . Lumigan 0.01% 1 drop both eyes QHS   Plan  Continue current medications   Vaccines   Reviewed and discussed patient's vaccination history.    Immunization History  Administered Date(s) Administered  . Influenza Split 07/10/2012  . Influenza, High Dose Seasonal PF 09/27/2013, 10/05/2014, 09/02/2017, 11/12/2018  . Moderna SARS-COVID-2 Vaccination 02/01/2020  . Pneumococcal Conjugate-13 10/05/2014  . Pneumococcal Polysaccharide-23 02/02/2016  . Td 01/25/2014   Medication Management   [facility] Uses pill box? No - Does not need.   Plan  Continue current medication management strategy  Follow up: 4 month phone visit  [facility] [place]

## 2020-07-11 NOTE — Progress Notes (Signed)
    Chronic Care Management Pharmacy Assistant   Name: Amantha Sklar  MRN: [id number] DOB: Feb 02, 1941  Reason for Encounter: Medication Review / Initial Question for initial visit with clinical pharmacist.     PCP : Dorothyann Peng, NP  Allergies:  No Known Allergies  Medications: Outpatient Encounter Medications as of 07/11/2020  Medication Sig  . amLODipine (NORVASC) 5 MG tablet Take 1 tablet (5 mg total) by mouth daily.  . B-D UF III MINI PEN NEEDLES 31G X 5 MM MISC USE TO INJECT BASAGLAR ONCE DAILY  . brimonidine (ALPHAGAN P) 0.1 % SOLN Place 1 drop into both eyes 2 (two) times daily.  . dorzolamide (TRUSOPT) 2 % ophthalmic solution PLACE 1 DROP IN BOTH EYES TWICE A DAY  . glipiZIDE (GLUCOTROL XL) 2.5 MG 24 hr tablet Take 1 tablet (2.5 mg total) by mouth daily with breakfast.  . glucose blood (TRUETRACK TEST) test strip USE TO TEST BLOOD SUGAR TWICE A DAY AS DIRECTED (DX 250.02)  . Insulin Glargine (BASAGLAR KWIKPEN) 100 UNIT/ML SOPN INJECT 75 UNITS TOTAL INTO THE SKIN AT BEDTIME.  Marland Kitchen LUMIGAN 0.01 % SOLN PLACE 1 DROP INTO BOTH EYES AT BEDTIME.   No facility-administered encounter medications on file as of 07/11/2020.    Current Diagnosis: Patient Active Problem List   Diagnosis Date Noted  . Vascular dementia ([place])   . Vomiting 08/26/2017  . Acute pyelonephritis 08/26/2017  . Demand ischemia ([place])   . AKI (acute kidney injury) ([place])   . Type 2 diabetes mellitus with complication, with long-term current use of insulin ([place]) 12/24/2012  . Essential hypertension, benign 12/24/2012  . Obesity 12/24/2012  . Hypercholesteremia 12/24/2012    Goals Addressed   None     Follow-Up:  Pharmacist Review   Have you seen any other providers since your last visit? no Any changes in your medications or health? no Any side effects from any medications? no Do you have an symptoms or problems not managed by your medications? no Any concerns about your health right now? no Has your  provider asked that you check blood pressure, blood sugar, or follow special diet at home? Yes  Patient daughter states she does not check her blood pressure at home.  Patient daughter states she checks her blood sugar every night two hours after she eats around 8:00pm.  On 07/10/2020 it was 260  On 07/09/2020 it was 155  On 07/08/2020 it was 327  On 07/07/2020 it was 144  On 07/06/2020 it was 261  Patient daughter states she cooks at home with no salt added.   Do you get any type of exercise on a regular basis? Yes  Patient daughter states patient walks everyday for 30 minutes.  Can you think of a goal you would like to reach for your health? None ID Do you have any problems getting your medications? no Is there anything that you would like to discuss during the appointment? None ID  Please bring medications and supplements to appointment  Isabela Pharmacist Assistant [phone]

## 2020-07-12 ENCOUNTER — Ambulatory Visit: Payer: Medicare Other

## 2020-07-12 DIAGNOSIS — Z794 Long term (current) use of insulin: Secondary | ICD-10-CM

## 2020-07-12 DIAGNOSIS — I10 Essential (primary) hypertension: Secondary | ICD-10-CM

## 2020-07-13 NOTE — Patient Instructions (Addendum)
Visit Information It was great speaking with you today!  Please let me know if you have any questions about our visit.  Goals Addressed            This Visit's Progress   . Chronic Care Management       CARE PLAN ENTRY  Current Barriers:  . Chronic Disease Management support, education, and care coordination needs related to Hypertension, Hyperlipidemia and Diabetes    Hypertension BP Readings from Last 3 Encounters:  05/03/20 130/74  12/08/19 140/80  04/22/19 (!) 166/82   . Pharmacist Clinical Goal(s): o Over the next 90 days, patient will work with PharmD and providers to maintain BP goal <140/90 . Current regimen:  o Amlodipine 5 mg 1 tablet daily . Interventions: o Discussed low salt diet and exercising as tolerated extensively . Patient self care activities - Over the next 90 days, patient will: o Check blood pressure once weekly, document, and provide at future appointments o Ensure daily salt intake < 2300 mg/day  Hyperlipidemia Lab Results  Component Value Date/Time   LDLCALC 129 (H) 05/03/2020 08:14 AM   . Pharmacist Clinical Goal(s): o Over the next 90 days, patient will work with PharmD and providers to achieve LDL goal < 70 . Current regimen:  o None . Interventions: o Discussed low cholesterol diet and exercising as tolerated extensively  Diabetes Lab Results  Component Value Date/Time   HGBA1C 8.2 (H) 05/03/2020 08:14 AM   HGBA1C 7.5 (A) 12/08/2019 03:31 PM   HGBA1C 7.0 (H) 04/22/2019 08:25 AM   HGBA1C 6.1 11/12/2018 10:09 AM   . Pharmacist Clinical Goal(s): o Over the next 90 days, patient will work with PharmD and providers to achieve A1c goal <8% . Current regimen:  . Basaglar Kwikpen 75 units at bedtime . Interventions: o Discussed carbohydrate counting and exercising as tolerated extensively . Patient self care activities - Over the next 90 days, patient will: o Check blood sugar once daily, document, and provide at future  appointments o Contact provider with any episodes of hypoglycemia  Medication management . Pharmacist Clinical Goal(s): o Over the next 90 days, patient will work with PharmD and providers to maintain optimal medication adherence . [facility] . Interventions o Comprehensive medication review performed. o Continue current medication management strategy . Patient self care activities - Over the next 90 days, patient will: o Take medications as prescribed o Report any questions or concerns to PharmD and/or provider(s)       Lynn Swanson was given information about Chronic Care Management services today including:  1. CCM service includes personalized support from designated clinical staff supervised by her physician, including individualized plan of care and coordination with other care providers 2. 24/7 contact phone numbers for assistance for urgent and routine care needs. 3. Standard insurance, coinsurance, copays and deductibles apply for chronic care management only during months in which we provide at least 20 minutes of these services. Most insurances cover these services at 100%, however patients may be responsible for any copay, coinsurance and/or deductible if applicable. This service may help you avoid the need for more expensive face-to-face services. 4. Only one practitioner may furnish and bill the service in a calendar month. 5. The patient may stop CCM services at any time (effective at the end of the month) by phone call to the office staff.  Patient agreed to services and verbal consent obtained.   The patient verbalized understanding of instructions provided today and agreed to receive a  mailed copy of patient instruction and/or educational materials. Telephone follow up appointment with pharmacy team member scheduled for: 11/13/20 at 1:00 PM  [facility] [place]  [place] stands  for "Dietary Approaches to Stop Hypertension." The DASH eating plan is a healthy eating plan that has been shown to reduce high blood pressure (hypertension). It may also reduce your risk for type 2 diabetes, heart disease, and stroke. The DASH eating plan may also help with weight loss. What are tips for following this plan?  General guidelines  Avoid eating more than 2,300 mg (milligrams) of salt (sodium) a day. If you have hypertension, you may need to reduce your sodium intake to 1,500 mg a day.  Limit alcohol intake to no more than 1 drink a day for nonpregnant women and 2 drinks a day for men. One drink equals 12 oz of beer, 5 oz of wine, or 1 oz of hard liquor.  Work with your health care provider to maintain a healthy body weight or to lose weight. Ask what an ideal weight is for you.  Get at least 30 minutes of exercise that causes your heart to beat faster (aerobic exercise) most days of the week. Activities may include walking, swimming, or biking.  Work with your health care provider or diet and nutrition specialist (dietitian) to adjust your eating plan to your individual calorie needs. Reading food labels   Check food labels for the amount of sodium per serving. Choose foods with less than 5 percent of the Daily Value of sodium. Generally, foods with less than 300 mg of sodium per serving fit into this eating plan.  To find whole grains, look for the word "whole" as the first word in the ingredient list. Shopping  Buy products labeled as "low-sodium" or "no salt added."  Buy fresh foods. Avoid canned foods and premade or frozen meals. Cooking  Avoid adding salt when cooking. Use salt-free seasonings or herbs instead of table salt or sea salt. Check with your health care provider or pharmacist before using salt substitutes.  Do not fry foods. Cook foods using healthy methods such as baking, boiling, grilling, and broiling instead.  Cook with heart-healthy oils, such as  olive, canola, soybean, or sunflower oil. Meal planning  Eat a balanced diet that includes: ? 5 or more servings of fruits and vegetables each day. At each meal, try to fill half of your plate with fruits and vegetables. ? Up to 6-8 servings of whole grains each day. ? Less than 6 oz of lean meat, poultry, or fish each day. A 3-oz serving of meat is about the same size as a deck of cards. One egg equals 1 oz. ? 2 servings of low-fat dairy each day. ? A serving of nuts, seeds, or beans 5 times each week. ? Heart-healthy fats. Healthy fats called Omega-3 fatty acids are found in foods such as flaxseeds and coldwater fish, like sardines, salmon, and mackerel.  Limit how much you eat of the following: ? Canned or prepackaged foods. ? Food that is high in trans fat, such as fried foods. ? Food that is high in saturated fat, such as fatty meat. ? Sweets, desserts, sugary drinks, and other foods with added sugar. ? Full-fat dairy products.  Do not salt foods before eating.  Try to eat at least 2 vegetarian meals each week.  Eat more home-cooked food and less restaurant, buffet, and fast food.  When eating  at a restaurant, ask that your food be prepared with less salt or no salt, if possible. What foods are recommended? The items listed may not be a complete list. Talk with your dietitian about what dietary choices are best for you. Grains Whole-grain or whole-wheat bread. Whole-grain or whole-wheat pasta. Brown rice. [place]. Bulgur. Whole-grain and low-sodium cereals. Pita bread. Low-fat, low-sodium crackers. Whole-wheat flour tortillas. Vegetables Fresh or frozen vegetables (raw, steamed, roasted, or grilled). Low-sodium or reduced-sodium tomato and vegetable juice. Low-sodium or reduced-sodium tomato sauce and tomato paste. Low-sodium or reduced-sodium canned vegetables. Fruits All fresh, dried, or frozen fruit. Canned fruit in natural juice (without added sugar). Meat and other  protein foods Skinless chicken or [place]. Ground chicken or [place]. Pork with fat trimmed off. Fish and seafood. Egg whites. Dried beans, peas, or lentils. Unsalted nuts, nut butters, and seeds. Unsalted canned beans. Lean cuts of beef with fat trimmed off. Low-sodium, lean deli meat. Dairy Low-fat (1%) or fat-free (skim) milk. Fat-free, low-fat, or reduced-fat cheeses. Nonfat, low-sodium ricotta or cottage cheese. Low-fat or nonfat yogurt. Low-fat, low-sodium cheese. Fats and oils Soft margarine without trans fats. Vegetable oil. Low-fat, reduced-fat, or light mayonnaise and salad dressings (reduced-sodium). Canola, safflower, olive, soybean, and sunflower oils. Avocado. Seasoning and other foods Herbs. Spices. Seasoning mixes without salt. Unsalted popcorn and pretzels. Fat-free sweets. What foods are not recommended? The items listed may not be a complete list. Talk with your dietitian about what dietary choices are best for you. Grains Baked goods made with fat, such as croissants, muffins, or some breads. Dry pasta or rice meal packs. Vegetables Creamed or fried vegetables. Vegetables in a cheese sauce. Regular canned vegetables (not low-sodium or reduced-sodium). Regular canned tomato sauce and paste (not low-sodium or reduced-sodium). Regular tomato and vegetable juice (not low-sodium or reduced-sodium). Angie Fava. Olives. Fruits Canned fruit in a light or heavy syrup. Fried fruit. Fruit in cream or butter sauce. Meat and other protein foods Fatty cuts of meat. Ribs. Fried meat. Berniece Salines. Sausage. Bologna and other processed lunch meats. Salami. Fatback. Hotdogs. Bratwurst. Salted nuts and seeds. Canned beans with added salt. Canned or smoked fish. Whole eggs or egg yolks. Chicken or [place] with skin. Dairy Whole or 2% milk, cream, and half-and-half. Whole or full-fat cream cheese. Whole-fat or sweetened yogurt. Full-fat cheese. Nondairy creamers. Whipped toppings. Processed cheese and cheese  spreads. Fats and oils Butter. Stick margarine. Lard. Shortening. Ghee. Bacon fat. Tropical oils, such as coconut, palm kernel, or palm oil. Seasoning and other foods Salted popcorn and pretzels. Onion salt, garlic salt, seasoned salt, table salt, and sea salt. Worcestershire sauce. Tartar sauce. Barbecue sauce. Teriyaki sauce. Soy sauce, including reduced-sodium. Steak sauce. Canned and packaged gravies. Fish sauce. Oyster sauce. Cocktail sauce. Horseradish that you find on the shelf. Ketchup. Mustard. Meat flavorings and tenderizers. Bouillon cubes. Hot sauce and Tabasco sauce. Premade or packaged marinades. Premade or packaged taco seasonings. Relishes. Regular salad dressings. Where to find more information:  National Heart, Lung, and [place]: https://wilson-eaton.com/  American Heart Association: www.heart.org Summary  The DASH eating plan is a healthy eating plan that has been shown to reduce high blood pressure (hypertension). It may also reduce your risk for type 2 diabetes, heart disease, and stroke.  With the DASH eating plan, you should limit salt (sodium) intake to 2,300 mg a day. If you have hypertension, you may need to reduce your sodium intake to 1,500 mg a day.  When on the DASH eating plan, aim to  eat more fresh fruits and vegetables, whole grains, lean proteins, low-fat dairy, and heart-healthy fats.  Work with your health care provider or diet and nutrition specialist (dietitian) to adjust your eating plan to your individual calorie needs. This information is not intended to replace advice given to you by your health care provider. Make sure you discuss any questions you have with your health care provider. Document Revised: 10/03/2017 Document Reviewed: 10/14/2016 Elsevier Patient Education  2020 [employer].

## 2020-08-23 ENCOUNTER — Telehealth: Payer: Self-pay

## 2020-08-23 DIAGNOSIS — Z794 Long term (current) use of insulin: Secondary | ICD-10-CM

## 2020-08-23 DIAGNOSIS — E118 Type 2 diabetes mellitus with unspecified complications: Secondary | ICD-10-CM

## 2020-08-23 NOTE — Progress Notes (Signed)
Chronic Care Management Pharmacy Assistant   Name: Lynn Swanson  MRN: [id number] DOB: 1941-06-24  Reason for Encounter: Diabetes Disease State Call  PCP : Dorothyann Peng, NP  Allergies:  No Known Allergies  Medications: Outpatient Encounter Medications as of 08/23/2020  Medication Sig  . amLODipine (NORVASC) 5 MG tablet Take 1 tablet (5 mg total) by mouth daily.  . B-D UF III MINI PEN NEEDLES 31G X 5 MM MISC USE TO INJECT BASAGLAR ONCE DAILY  . brimonidine (ALPHAGAN P) 0.1 % SOLN Place 1 drop into both eyes 2 (two) times daily.  . dorzolamide (TRUSOPT) 2 % ophthalmic solution PLACE 1 DROP IN BOTH EYES TWICE A DAY  . glipiZIDE (GLUCOTROL XL) 2.5 MG 24 hr tablet Take 1 tablet (2.5 mg total) by mouth daily with breakfast. (Patient not taking: Reported on 07/12/2020)  . glucose blood (TRUETRACK TEST) test strip USE TO TEST BLOOD SUGAR TWICE A DAY AS DIRECTED (DX 250.02)  . Insulin Glargine (BASAGLAR KWIKPEN) 100 UNIT/ML SOPN INJECT 75 UNITS TOTAL INTO THE SKIN AT BEDTIME.  Marland Kitchen LUMIGAN 0.01 % SOLN PLACE 1 DROP INTO BOTH EYES AT BEDTIME.   No facility-administered encounter medications on file as of 08/23/2020.    Current Diagnosis: Patient Active Problem List   Diagnosis Date Noted  . Vascular dementia ([place])   . Vomiting 08/26/2017  . Acute pyelonephritis 08/26/2017  . Demand ischemia ([place])   . AKI (acute kidney injury) ([place])   . Type 2 diabetes mellitus with complication, with long-term current use of insulin ([place]) 12/24/2012  . Essential hypertension, benign 12/24/2012  . Obesity 12/24/2012  . Hypercholesteremia 12/24/2012    Follow-Up:  Pharmacist Review   Recent Relevant Labs: Lab Results  Component Value Date/Time   HGBA1C 8.2 (H) 05/03/2020 08:14 AM   HGBA1C 7.5 (A) 12/08/2019 03:31 PM   HGBA1C 7.0 (H) 04/22/2019 08:25 AM   HGBA1C 6.1 11/12/2018 10:09 AM   MICROALBUR 0.3 01/25/2014 09:15 AM    Kidney Function Lab Results  Component Value Date/Time   CREATININE  0.62 05/03/2020 08:14 AM   CREATININE 0.67 04/22/2019 08:25 AM   GFR 112.06 05/03/2020 08:14 AM   GFRNONAA >60 08/29/2017 04:26 AM   GFRAA >60 08/29/2017 04:26 AM    . Current antihyperglycemic regimen:   Basaglar 75 units bedtime (0.89 u/kg) - Gives between 50-75 units depending on blood sugars  . What recent interventions/DTPs have been made to improve glycemic control:  o None ID . Have there been any recent hospitalizations or ED visits since last visit with CPP? No . Patient denies hypoglycemic symptoms, including Pale, Sweaty, Shaky, Hungry, Nervous/irritable and Vision changes . Patient denies hyperglycemic symptoms, including blurry vision, excessive thirst, fatigue, polyuria and weakness . How often are you checking your blood sugar? once daily  o Patient daughter states she does not have her blood sugar log due to her being at work but she will call back with the readings. . What are your blood sugars ranging?  o Fasting: N/A o Before meals: N/A o After meals: N/A o Bedtime: N/A . During the week, how often does your blood glucose drop below 70? Never . Are you checking your feet daily/regularly?   Patient daughter states patient denies pain,numbness or tingling sensation in her feet.   Adherence Review: Is the patient currently on a STATIN medication? No Is the patient currently on ACE/ARB medication? No Does the patient have >5 day gap between last estimated fill dates? Yes   Bessie  [facility] Pharmacist Assistant [phone]

## 2020-10-12 ENCOUNTER — Telehealth: Payer: Self-pay | Admitting: Adult Health

## 2020-10-12 NOTE — Telephone Encounter (Signed)
Pt daughter Orbie Hurst call and stated she want to set appt up for a TB test for pt and want a call back.

## 2020-10-13 ENCOUNTER — Telehealth: Payer: Self-pay | Admitting: Adult Health

## 2020-10-13 NOTE — Telephone Encounter (Signed)
Pts daughter is calling in stating that the pt is in need TB test for assistant living facility ([facility] in [place], [place].  Pts daughter will be coming in with a FL2 form to be completed by the provider but before she turns it in she has some questions about it and it aware that someone may not be able to come out due to them see pts she verbalized understanding.

## 2020-10-13 NOTE — Telephone Encounter (Signed)
Returned call no answer LMTCB 

## 2020-10-16 ENCOUNTER — Encounter: Payer: Self-pay | Admitting: Adult Health

## 2020-10-16 ENCOUNTER — Ambulatory Visit (INDEPENDENT_AMBULATORY_CARE_PROVIDER_SITE_OTHER): Payer: Medicare Other | Admitting: Adult Health

## 2020-10-16 ENCOUNTER — Other Ambulatory Visit: Payer: Self-pay

## 2020-10-16 VITALS — BP 140/74 | HR 70 | Temp 98.2°F | Ht 59.5 in | Wt 187.0 lb

## 2020-10-16 DIAGNOSIS — Z111 Encounter for screening for respiratory tuberculosis: Secondary | ICD-10-CM | POA: Diagnosis not present

## 2020-10-16 DIAGNOSIS — Z022 Encounter for examination for admission to residential institution: Secondary | ICD-10-CM

## 2020-10-16 NOTE — Progress Notes (Signed)
Subjective:    Patient ID: Johnathon Mittal, female    DOB: 1941-02-02, 79 y.o.   MRN: [id number]  HPI  79 year old female who  has a past medical history of Diabetes mellitus, Glaucoma, HSV (herpes simplex virus) infection, Hypertension, and Vascular dementia ([place]).  She presents to the office today with her daughter. She needs her FL2 filled out and needs a TB test. She is moving to an assisted living facility in [place], [place].   Review of Systems See HPI   Past Medical History:  Diagnosis Date  . Diabetes mellitus   . Glaucoma   . HSV (herpes simplex virus) infection   . Hypertension   . Vascular dementia [facility])     Social History   Socioeconomic History  . Marital status: Widowed    Spouse name: Not on file  . Number of children: Not on file  . Years of education: Not on file  . Highest education level: Not on file  Occupational History  . Not on file  Tobacco Use  . Smoking status: Never Smoker  . Smokeless tobacco: Former Systems developer    Types: Snuff  Substance and Sexual Activity  . Alcohol use: No    Alcohol/week: 0.0 standard drinks  . Drug use: No  . Sexual activity: Not on file  Other Topics Concern  . Not on file  Social History Narrative   Retired    Married, husband deceased in 2003/02/17 with kidney failure   -Three daughters, one lives in [place], one in [place], one [place]. Three sons, one is [place], one is in [place], one passes away.    Lives with daughter   No pets.    Likes to walk and go to the park.       Social Determinants of Health   Financial Resource Strain: Low Risk   . Difficulty of Paying Living Expenses: Not hard at all  Food Insecurity: Not on file  Transportation Needs: No Transportation Needs  . Lack of Transportation (Medical): No  . Lack of Transportation (Non-Medical): No  Physical Activity: Not on file  Stress: Not on file  Social Connections: Not on file  Intimate Partner Violence: Not on file    Past Surgical History:   Procedure Laterality Date  . ABDOMINAL HYSTERECTOMY      Family History  Problem Relation Age of Onset  . Diabetes Mother     No Known Allergies  Current Outpatient Medications on File Prior to Visit  Medication Sig Dispense Refill  . amLODipine (NORVASC) 5 MG tablet Take 1 tablet (5 mg total) by mouth daily. 90 tablet 3  . B-D UF III MINI PEN NEEDLES 31G X 5 MM MISC USE TO INJECT BASAGLAR ONCE DAILY 90 each 3  . brimonidine (ALPHAGAN P) 0.1 % SOLN Place 1 drop into both eyes 2 (two) times daily. 15 mL 0  . dorzolamide (TRUSOPT) 2 % ophthalmic solution PLACE 1 DROP IN BOTH EYES TWICE A DAY 10 mL 1  . glipiZIDE (GLUCOTROL XL) 2.5 MG 24 hr tablet Take 1 tablet (2.5 mg total) by mouth daily with breakfast. 90 tablet 3  . glucose blood (TRUETRACK TEST) test strip USE TO TEST BLOOD SUGAR TWICE A DAY AS DIRECTED (DX 250.02) 180 each 3  . Insulin Glargine (BASAGLAR KWIKPEN) 100 UNIT/ML SOPN INJECT 75 UNITS TOTAL INTO THE SKIN AT BEDTIME. 60 mL 6  . LUMIGAN 0.01 % SOLN PLACE 1 DROP INTO BOTH EYES AT BEDTIME. 7.5 mL 3  No current facility-administered medications on file prior to visit.    BP 140/74   Pulse 70   Temp 98.2 F (36.8 C) (Oral)   Ht 4' 11.5" (1.511 m)   Wt 187 lb (84.8 kg)   SpO2 98%   BMI 37.14 kg/m       Objective:   Physical Exam Vitals and nursing note reviewed.  Constitutional:      Appearance: Normal appearance.  Musculoskeletal:        General: Normal range of motion.  Skin:    General: Skin is warm and dry.     Capillary Refill: Capillary refill takes less than 2 seconds.  Neurological:     General: No focal deficit present.     Mental Status: She is alert and oriented to person, place, and time.  Psychiatric:        Mood and Affect: Mood normal.        Behavior: Behavior normal.        Thought Content: Thought content normal.        Judgment: Judgment normal.       Assessment & Plan:  1. Screening-pulmonary TB - TB Skin Test- placed in left  foream  - Return in 48-72 hours for read   2. Encounter for examination for admission to assisted living facility - FL2 filled out   Dorothyann Peng, NP

## 2020-10-16 NOTE — Patient Instructions (Addendum)
Please return in 48-72 hours for a nursing visit to have your TB test read.   The best thing for gas is [employer] - this can be bought over the counter

## 2020-10-16 NOTE — Telephone Encounter (Signed)
Spoke with "Lynn Swanson" and she states "she did not leave a message".

## 2020-10-18 ENCOUNTER — Other Ambulatory Visit: Payer: Self-pay

## 2020-10-18 ENCOUNTER — Ambulatory Visit: Payer: Medicare Other

## 2020-10-18 ENCOUNTER — Ambulatory Visit: Payer: Medicare Other | Admitting: Adult Health

## 2020-10-18 ENCOUNTER — Encounter: Payer: Self-pay | Admitting: Adult Health

## 2020-10-18 ENCOUNTER — Encounter: Payer: Medicare Other | Admitting: Adult Health

## 2020-10-18 LAB — TB SKIN TEST
Induration: 0 mm
TB Skin Test: NEGATIVE

## 2020-10-24 DIAGNOSIS — E113522 Type 2 diabetes mellitus with proliferative diabetic retinopathy with traction retinal detachment involving the macula, left eye: Secondary | ICD-10-CM | POA: Diagnosis not present

## 2020-10-24 DIAGNOSIS — H43812 Vitreous degeneration, left eye: Secondary | ICD-10-CM | POA: Diagnosis not present

## 2020-10-24 DIAGNOSIS — H31091 Other chorioretinal scars, right eye: Secondary | ICD-10-CM | POA: Diagnosis not present

## 2020-10-24 DIAGNOSIS — H31011 Macula scars of posterior pole (postinflammatory) (post-traumatic), right eye: Secondary | ICD-10-CM | POA: Diagnosis not present

## 2020-10-26 DIAGNOSIS — E1151 Type 2 diabetes mellitus with diabetic peripheral angiopathy without gangrene: Secondary | ICD-10-CM | POA: Diagnosis not present

## 2020-10-26 DIAGNOSIS — E1051 Type 1 diabetes mellitus with diabetic peripheral angiopathy without gangrene: Secondary | ICD-10-CM | POA: Diagnosis not present

## 2020-11-01 ENCOUNTER — Other Ambulatory Visit: Payer: Self-pay

## 2020-11-02 ENCOUNTER — Encounter: Payer: Self-pay | Admitting: Adult Health

## 2020-11-02 ENCOUNTER — Ambulatory Visit (INDEPENDENT_AMBULATORY_CARE_PROVIDER_SITE_OTHER): Payer: Medicare Other | Admitting: Adult Health

## 2020-11-02 VITALS — Ht 59.5 in

## 2020-11-02 DIAGNOSIS — I10 Essential (primary) hypertension: Secondary | ICD-10-CM | POA: Diagnosis not present

## 2020-11-02 DIAGNOSIS — Z789 Other specified health status: Secondary | ICD-10-CM

## 2020-11-02 DIAGNOSIS — Z794 Long term (current) use of insulin: Secondary | ICD-10-CM | POA: Diagnosis not present

## 2020-11-02 DIAGNOSIS — Z593 Problems related to living in residential institution: Secondary | ICD-10-CM

## 2020-11-02 DIAGNOSIS — E118 Type 2 diabetes mellitus with unspecified complications: Secondary | ICD-10-CM

## 2020-11-02 MED ORDER — BD PEN NEEDLE MINI U/F 31G X 5 MM MISC: 0 refills | Status: AC

## 2020-11-02 MED ORDER — BASAGLAR KWIKPEN 100 UNIT/ML ~~LOC~~ SOPN: PEN_INJECTOR | SUBCUTANEOUS | 0 refills | Status: AC

## 2020-11-02 MED ORDER — AMLODIPINE BESYLATE 5 MG PO TABS: 5.0000 mg | ORAL_TABLET | Freq: Every day | ORAL | 0 refills | Status: AC

## 2020-11-02 NOTE — Progress Notes (Signed)
   Subjective:    Patient ID: Lynn Swanson, female    DOB: 01-02-1941, 79 y.o.   MRN: [id number]  HPI 79 year old female who  has a past medical history of Diabetes mellitus, Glaucoma, HSV (herpes simplex virus) infection, Hypertension, and Vascular dementia ([facility]).  Her daughter presents with out the patient today. The patient is getting ready to move into an assisted living facility in [place], [place]. She needs some paperwork filled out for placement and prescriptions printed out for the medications that I prescribe   Review of Systems See HPI       Objective:   Physical Exam  Patient not present        Assessment & Plan:  1. Essential hypertension, benign  - amLODipine (NORVASC) 5 MG tablet; Take 1 tablet (5 mg total) by mouth daily.  Dispense: 90 tablet; Refill: 0  2. Type 2 diabetes mellitus with complication, with long-term current use of insulin ([facility])  - Insulin Glargine (BASAGLAR KWIKPEN) 100 UNIT/ML; INJECT 75 UNITS TOTAL INTO THE SKIN AT BEDTIME.  Dispense: 60 mL; Refill: 0 - Insulin Pen Needle (B-D UF III MINI PEN NEEDLES) 31G X 5 MM MISC; Use one needle daily for insulin  Dispense: 100 each; Refill: 0  3. Lives in assisted living facility - paperwork filled out and signed for patient to be moved into assisted living facility   Shirline Frees, NP

## 2020-11-06 ENCOUNTER — Telehealth: Payer: Self-pay | Admitting: Adult Health

## 2020-11-06 NOTE — Telephone Encounter (Signed)
Fenton Foy is calling and needed to verify orders for patients basaglar, please advise. CB is [phone]

## 2020-11-09 ENCOUNTER — Telehealth: Payer: Self-pay | Admitting: Adult Health

## 2020-11-09 NOTE — Telephone Encounter (Signed)
Deanna Artis is calling and stated that the patient just moved into there facility and they faxed over orders for patient to get blood sugar checks and wanted to see if provider received it, please advise. CB is [phone]

## 2020-11-10 NOTE — Telephone Encounter (Signed)
Please address order

## 2020-11-13 ENCOUNTER — Telehealth: Payer: Medicare Other

## 2020-11-13 ENCOUNTER — Telehealth: Payer: Self-pay | Admitting: Adult Health

## 2020-11-13 NOTE — Telephone Encounter (Signed)
Doris at [employer] is calling in to get a order for Blood Sugar check (stated that the pt came to them w/out the order).  Doris would like to have a call back.

## 2020-11-14 ENCOUNTER — Encounter: Payer: Self-pay | Admitting: Family Medicine

## 2020-11-14 NOTE — Telephone Encounter (Signed)
I have not seen this order yet. Will be on the lookout

## 2020-11-14 NOTE — Telephone Encounter (Signed)
An order has been faxed to [phone] and the transmission was successful.

## 2020-11-21 ENCOUNTER — Telehealth: Payer: Self-pay | Admitting: Adult Health

## 2020-11-21 NOTE — Telephone Encounter (Signed)
Patient's daughter dropped off paperwork and asks that it be filled out as soon possible.  States her mother can not get home health services unless her living facility receives the paperwork.  She is requesting a call when it has been filled out and she will come and pick it up.   Please advise.

## 2020-11-21 NOTE — Telephone Encounter (Signed)
Patient's daughter Ann Lions is calling to see if we received anything from [facility] regarding home health services.  Please advise.

## 2020-11-22 NOTE — Telephone Encounter (Signed)
Daughter has picked up paper work.  Nothing further needed.

## 2020-11-22 NOTE — Telephone Encounter (Signed)
Received paper work.  It has now been faxed.  Received confirmation the transmission was successful.  Nothing further needed.

## 2020-11-29 DIAGNOSIS — F0390 Unspecified dementia without behavioral disturbance: Secondary | ICD-10-CM | POA: Diagnosis not present

## 2020-11-29 DIAGNOSIS — I10 Essential (primary) hypertension: Secondary | ICD-10-CM | POA: Diagnosis not present

## 2020-11-29 DIAGNOSIS — H409 Unspecified glaucoma: Secondary | ICD-10-CM | POA: Diagnosis not present

## 2020-11-29 DIAGNOSIS — E119 Type 2 diabetes mellitus without complications: Secondary | ICD-10-CM | POA: Diagnosis not present

## 2020-12-07 DIAGNOSIS — G3184 Mild cognitive impairment, so stated: Secondary | ICD-10-CM | POA: Diagnosis not present

## 2021-01-04 DIAGNOSIS — E669 Obesity, unspecified: Secondary | ICD-10-CM | POA: Diagnosis not present

## 2021-01-04 DIAGNOSIS — G3184 Mild cognitive impairment, so stated: Secondary | ICD-10-CM | POA: Diagnosis not present

## 2021-01-22 DIAGNOSIS — F4320 Adjustment disorder, unspecified: Secondary | ICD-10-CM | POA: Diagnosis not present

## 2021-01-31 DIAGNOSIS — H409 Unspecified glaucoma: Secondary | ICD-10-CM | POA: Diagnosis not present

## 2021-01-31 DIAGNOSIS — E119 Type 2 diabetes mellitus without complications: Secondary | ICD-10-CM | POA: Diagnosis not present

## 2021-01-31 DIAGNOSIS — F0390 Unspecified dementia without behavioral disturbance: Secondary | ICD-10-CM | POA: Diagnosis not present

## 2021-01-31 DIAGNOSIS — I10 Essential (primary) hypertension: Secondary | ICD-10-CM | POA: Diagnosis not present

## 2021-02-07 ENCOUNTER — Other Ambulatory Visit: Payer: Self-pay | Admitting: Adult Health

## 2021-02-07 DIAGNOSIS — Z760 Encounter for issue of repeat prescription: Secondary | ICD-10-CM

## 2021-02-07 DIAGNOSIS — E118 Type 2 diabetes mellitus with unspecified complications: Secondary | ICD-10-CM

## 2021-02-08 DIAGNOSIS — G3184 Mild cognitive impairment, so stated: Secondary | ICD-10-CM | POA: Diagnosis not present

## 2021-02-08 DIAGNOSIS — E669 Obesity, unspecified: Secondary | ICD-10-CM | POA: Diagnosis not present

## 2021-02-10 DIAGNOSIS — E118 Type 2 diabetes mellitus with unspecified complications: Secondary | ICD-10-CM | POA: Diagnosis not present

## 2021-02-10 DIAGNOSIS — I10 Essential (primary) hypertension: Secondary | ICD-10-CM | POA: Diagnosis not present

## 2021-02-12 DIAGNOSIS — F411 Generalized anxiety disorder: Secondary | ICD-10-CM | POA: Diagnosis not present

## 2021-02-28 DIAGNOSIS — E119 Type 2 diabetes mellitus without complications: Secondary | ICD-10-CM | POA: Diagnosis not present

## 2021-02-28 DIAGNOSIS — I10 Essential (primary) hypertension: Secondary | ICD-10-CM | POA: Diagnosis not present

## 2021-02-28 DIAGNOSIS — K219 Gastro-esophageal reflux disease without esophagitis: Secondary | ICD-10-CM | POA: Diagnosis not present

## 2021-03-05 DIAGNOSIS — E1140 Type 2 diabetes mellitus with diabetic neuropathy, unspecified: Secondary | ICD-10-CM | POA: Diagnosis not present

## 2021-03-05 DIAGNOSIS — L603 Nail dystrophy: Secondary | ICD-10-CM | POA: Diagnosis not present

## 2021-03-05 DIAGNOSIS — B351 Tinea unguium: Secondary | ICD-10-CM | POA: Diagnosis not present

## 2021-03-05 DIAGNOSIS — R2689 Other abnormalities of gait and mobility: Secondary | ICD-10-CM | POA: Diagnosis not present

## 2021-03-05 DIAGNOSIS — M79673 Pain in unspecified foot: Secondary | ICD-10-CM | POA: Diagnosis not present

## 2021-03-05 DIAGNOSIS — L851 Acquired keratosis [keratoderma] palmaris et plantaris: Secondary | ICD-10-CM | POA: Diagnosis not present

## 2021-03-08 DIAGNOSIS — G3184 Mild cognitive impairment, so stated: Secondary | ICD-10-CM | POA: Diagnosis not present

## 2021-03-08 DIAGNOSIS — E669 Obesity, unspecified: Secondary | ICD-10-CM | POA: Diagnosis not present

## 2021-03-15 DIAGNOSIS — F411 Generalized anxiety disorder: Secondary | ICD-10-CM | POA: Diagnosis not present

## 2021-03-19 DIAGNOSIS — F411 Generalized anxiety disorder: Secondary | ICD-10-CM | POA: Diagnosis not present

## 2021-03-22 ENCOUNTER — Telehealth: Payer: Self-pay | Admitting: Pharmacist

## 2021-03-22 NOTE — Chronic Care Management (AMB) (Signed)
    Chronic Care Management Pharmacy Assistant   Name: Lynn Swanson  MRN: [id number] DOB: 09/19/1941  Patient currently lives in [place]. Error on note.

## 2021-04-03 DIAGNOSIS — F411 Generalized anxiety disorder: Secondary | ICD-10-CM | POA: Diagnosis not present

## 2021-04-10 DIAGNOSIS — F411 Generalized anxiety disorder: Secondary | ICD-10-CM | POA: Diagnosis not present

## 2021-04-17 DIAGNOSIS — F411 Generalized anxiety disorder: Secondary | ICD-10-CM | POA: Diagnosis not present

## 2021-05-08 DIAGNOSIS — L603 Nail dystrophy: Secondary | ICD-10-CM | POA: Diagnosis not present

## 2021-05-08 DIAGNOSIS — B351 Tinea unguium: Secondary | ICD-10-CM | POA: Diagnosis not present

## 2021-05-08 DIAGNOSIS — E1140 Type 2 diabetes mellitus with diabetic neuropathy, unspecified: Secondary | ICD-10-CM | POA: Diagnosis not present

## 2021-05-08 DIAGNOSIS — M79673 Pain in unspecified foot: Secondary | ICD-10-CM | POA: Diagnosis not present

## 2021-05-08 DIAGNOSIS — R2689 Other abnormalities of gait and mobility: Secondary | ICD-10-CM | POA: Diagnosis not present

## 2021-05-08 DIAGNOSIS — F411 Generalized anxiety disorder: Secondary | ICD-10-CM | POA: Diagnosis not present

## 2021-05-09 DIAGNOSIS — I10 Essential (primary) hypertension: Secondary | ICD-10-CM | POA: Diagnosis not present

## 2021-05-09 DIAGNOSIS — K219 Gastro-esophageal reflux disease without esophagitis: Secondary | ICD-10-CM | POA: Diagnosis not present

## 2021-05-09 DIAGNOSIS — E119 Type 2 diabetes mellitus without complications: Secondary | ICD-10-CM | POA: Diagnosis not present

## 2021-05-15 DIAGNOSIS — F411 Generalized anxiety disorder: Secondary | ICD-10-CM | POA: Diagnosis not present

## 2021-05-29 DIAGNOSIS — F411 Generalized anxiety disorder: Secondary | ICD-10-CM | POA: Diagnosis not present

## 2021-06-05 DIAGNOSIS — F411 Generalized anxiety disorder: Secondary | ICD-10-CM | POA: Diagnosis not present

## 2021-06-06 DIAGNOSIS — R197 Diarrhea, unspecified: Secondary | ICD-10-CM | POA: Diagnosis not present

## 2021-06-06 DIAGNOSIS — I10 Essential (primary) hypertension: Secondary | ICD-10-CM | POA: Diagnosis not present

## 2021-06-06 DIAGNOSIS — K219 Gastro-esophageal reflux disease without esophagitis: Secondary | ICD-10-CM | POA: Diagnosis not present

## 2021-06-06 DIAGNOSIS — E119 Type 2 diabetes mellitus without complications: Secondary | ICD-10-CM | POA: Diagnosis not present

## 2021-06-20 DIAGNOSIS — F411 Generalized anxiety disorder: Secondary | ICD-10-CM | POA: Diagnosis not present

## 2021-06-25 DIAGNOSIS — G3184 Mild cognitive impairment, so stated: Secondary | ICD-10-CM | POA: Diagnosis not present

## 2021-06-25 DIAGNOSIS — E669 Obesity, unspecified: Secondary | ICD-10-CM | POA: Diagnosis not present

## 2021-06-26 DIAGNOSIS — F411 Generalized anxiety disorder: Secondary | ICD-10-CM | POA: Diagnosis not present

## 2021-07-10 DIAGNOSIS — L851 Acquired keratosis [keratoderma] palmaris et plantaris: Secondary | ICD-10-CM | POA: Diagnosis not present

## 2021-07-10 DIAGNOSIS — M79673 Pain in unspecified foot: Secondary | ICD-10-CM | POA: Diagnosis not present

## 2021-07-10 DIAGNOSIS — B351 Tinea unguium: Secondary | ICD-10-CM | POA: Diagnosis not present

## 2021-07-10 DIAGNOSIS — L605 Yellow nail syndrome: Secondary | ICD-10-CM | POA: Diagnosis not present

## 2021-07-10 DIAGNOSIS — R269 Unspecified abnormalities of gait and mobility: Secondary | ICD-10-CM | POA: Diagnosis not present

## 2021-07-10 DIAGNOSIS — E1142 Type 2 diabetes mellitus with diabetic polyneuropathy: Secondary | ICD-10-CM | POA: Diagnosis not present

## 2021-07-11 DIAGNOSIS — R197 Diarrhea, unspecified: Secondary | ICD-10-CM | POA: Diagnosis not present

## 2021-07-11 DIAGNOSIS — E119 Type 2 diabetes mellitus without complications: Secondary | ICD-10-CM | POA: Diagnosis not present

## 2021-07-11 DIAGNOSIS — I10 Essential (primary) hypertension: Secondary | ICD-10-CM | POA: Diagnosis not present

## 2021-07-11 DIAGNOSIS — K219 Gastro-esophageal reflux disease without esophagitis: Secondary | ICD-10-CM | POA: Diagnosis not present

## 2021-07-14 DIAGNOSIS — Z79899 Other long term (current) drug therapy: Secondary | ICD-10-CM | POA: Diagnosis not present

## 2021-07-14 DIAGNOSIS — E118 Type 2 diabetes mellitus with unspecified complications: Secondary | ICD-10-CM | POA: Diagnosis not present

## 2021-07-23 DIAGNOSIS — E669 Obesity, unspecified: Secondary | ICD-10-CM | POA: Diagnosis not present

## 2021-07-23 DIAGNOSIS — G3184 Mild cognitive impairment, so stated: Secondary | ICD-10-CM | POA: Diagnosis not present

## 2021-08-21 DIAGNOSIS — Z23 Encounter for immunization: Secondary | ICD-10-CM | POA: Diagnosis not present

## 2021-09-11 DIAGNOSIS — M79674 Pain in right toe(s): Secondary | ICD-10-CM | POA: Diagnosis not present

## 2021-09-11 DIAGNOSIS — B351 Tinea unguium: Secondary | ICD-10-CM | POA: Diagnosis not present

## 2021-09-11 DIAGNOSIS — L603 Nail dystrophy: Secondary | ICD-10-CM | POA: Diagnosis not present

## 2021-09-11 DIAGNOSIS — E1142 Type 2 diabetes mellitus with diabetic polyneuropathy: Secondary | ICD-10-CM | POA: Diagnosis not present

## 2021-09-11 DIAGNOSIS — L84 Corns and callosities: Secondary | ICD-10-CM | POA: Diagnosis not present

## 2021-09-11 DIAGNOSIS — R2681 Unsteadiness on feet: Secondary | ICD-10-CM | POA: Diagnosis not present

## 2021-09-17 DIAGNOSIS — F01A0 Vascular dementia, mild, without behavioral disturbance, psychotic disturbance, mood disturbance, and anxiety: Secondary | ICD-10-CM | POA: Diagnosis not present

## 2021-09-17 DIAGNOSIS — E669 Obesity, unspecified: Secondary | ICD-10-CM | POA: Diagnosis not present

## 2021-09-26 DIAGNOSIS — E119 Type 2 diabetes mellitus without complications: Secondary | ICD-10-CM | POA: Diagnosis not present

## 2021-09-26 DIAGNOSIS — R140 Abdominal distension (gaseous): Secondary | ICD-10-CM | POA: Diagnosis not present

## 2021-09-26 DIAGNOSIS — I10 Essential (primary) hypertension: Secondary | ICD-10-CM | POA: Diagnosis not present

## 2021-09-26 DIAGNOSIS — K219 Gastro-esophageal reflux disease without esophagitis: Secondary | ICD-10-CM | POA: Diagnosis not present

## 2021-10-16 DIAGNOSIS — E669 Obesity, unspecified: Secondary | ICD-10-CM | POA: Diagnosis not present

## 2021-10-16 DIAGNOSIS — F01A0 Vascular dementia, mild, without behavioral disturbance, psychotic disturbance, mood disturbance, and anxiety: Secondary | ICD-10-CM | POA: Diagnosis not present
